# Patient Record
Sex: Male | Born: 1950
Health system: Southern US, Community
[De-identification: ages and names within clinical notes are randomized; demographics above are authoritative.]

## PROBLEM LIST (undated history)

## (undated) DIAGNOSIS — E039 Hypothyroidism, unspecified: Secondary | ICD-10-CM

## (undated) DIAGNOSIS — I10 Essential (primary) hypertension: Secondary | ICD-10-CM

## (undated) DIAGNOSIS — I7121 Aneurysm of the ascending aorta, without rupture: Secondary | ICD-10-CM

## (undated) DIAGNOSIS — E785 Hyperlipidemia, unspecified: Secondary | ICD-10-CM

## (undated) DIAGNOSIS — I712 Thoracic aortic aneurysm, without rupture: Secondary | ICD-10-CM

## (undated) DIAGNOSIS — K219 Gastro-esophageal reflux disease without esophagitis: Secondary | ICD-10-CM

## (undated) HISTORY — DX: Aneurysm of the ascending aorta, without rupture: I71.21

## (undated) HISTORY — PX: COLONOSCOPY: SHX174

## (undated) HISTORY — DX: Essential (primary) hypertension: I10

## (undated) HISTORY — PX: NO PAST SURGERIES: SHX2092

## (undated) HISTORY — DX: Thoracic aortic aneurysm, without rupture: I71.2

## (undated) HISTORY — DX: Gastro-esophageal reflux disease without esophagitis: K21.9

## (undated) HISTORY — DX: Hyperlipidemia, unspecified: E78.5

## (undated) HISTORY — PX: HEMORRHOID SURGERY: SHX153

## (undated) HISTORY — DX: Hypothyroidism, unspecified: E03.9

---

## 2010-12-26 LAB — HM COLONOSCOPY: HM Colonoscopy: NEGATIVE

## 2014-03-03 ENCOUNTER — Telehealth: Payer: Self-pay | Admitting: Internal Medicine

## 2014-03-03 NOTE — Telephone Encounter (Signed)
Dr Larose Kells Is this ok to do? He is a New patient.

## 2014-03-03 NOTE — Telephone Encounter (Signed)
Patient came in and stated that he will be running out of his medication before his visit with [place] on 03/19/2014. Patient wanted to see if dr Larose Kells would refill his Metoprolol succer 50 mg tab and his Levothyroxine 175 mcg tablet. Please advise  Pharmacy [facility] [address]

## 2014-03-03 NOTE — Telephone Encounter (Signed)
Okay to send a three-week supply

## 2014-03-04 MED ORDER — LEVOTHYROXINE SODIUM 150 MCG PO TABS: 150.0000 ug | ORAL_TABLET | Freq: Every day | ORAL | Status: DC

## 2014-03-04 MED ORDER — METOPROLOL SUCCINATE ER 50 MG PO TB24: 50.0000 mg | ORAL_TABLET | Freq: Every day | ORAL | Status: DC

## 2014-03-04 NOTE — Addendum Note (Signed)
Addended by: Reino Bellis on: 03/04/2014 10:38 AM   Modules accepted: Orders

## 2014-03-04 NOTE — Telephone Encounter (Addendum)
Called and verified dosing with [facility] pharmacist. E-scribed new medication. Patient notified

## 2014-03-18 ENCOUNTER — Telehealth: Payer: Self-pay

## 2014-03-18 NOTE — Telephone Encounter (Signed)
Medication List and allergies:  Reviewed and updated  90 day supply/mail order: na Local prescriptions: [facility] [address]  Immunizations due: declines Tdap "doesn't need"  A/P:   Entered PH from sheet left at front desk--not forthcoming with information Flu vaccine--doesn't get Tdap--declines PNA--never Shingles--2012 CCS--2012--in [place]--neg per patient PSA--?  Patient noted that he is taking pravastatin but no dosing.  Would not go over any information on the phone.

## 2014-03-19 ENCOUNTER — Encounter: Payer: Self-pay | Admitting: Internal Medicine

## 2014-03-19 ENCOUNTER — Ambulatory Visit (INDEPENDENT_AMBULATORY_CARE_PROVIDER_SITE_OTHER): Payer: BC Managed Care – PPO | Admitting: Internal Medicine

## 2014-03-19 VITALS — BP 136/85 | HR 72 | Temp 97.9°F | Ht 66.5 in | Wt 202.0 lb

## 2014-03-19 DIAGNOSIS — I10 Essential (primary) hypertension: Secondary | ICD-10-CM | POA: Insufficient documentation

## 2014-03-19 DIAGNOSIS — E039 Hypothyroidism, unspecified: Secondary | ICD-10-CM | POA: Insufficient documentation

## 2014-03-19 DIAGNOSIS — E785 Hyperlipidemia, unspecified: Secondary | ICD-10-CM | POA: Insufficient documentation

## 2014-03-19 DIAGNOSIS — Z0000 Encounter for general adult medical examination without abnormal findings: Secondary | ICD-10-CM

## 2014-03-19 LAB — COMPREHENSIVE METABOLIC PANEL
ALBUMIN: 4.4 g/dL (ref 3.5–5.2)
ALK PHOS: 53 U/L (ref 39–117)
ALT: 27 U/L (ref 0–53)
AST: 29 U/L (ref 0–37)
BUN: 12 mg/dL (ref 6–23)
CO2: 28 mEq/L (ref 19–32)
CREATININE: 1 mg/dL (ref 0.4–1.5)
Calcium: 9.7 mg/dL (ref 8.4–10.5)
Chloride: 103 mEq/L (ref 96–112)
GFR: 99.14 mL/min (ref >60.00)
Glucose, Bld: 89 mg/dL (ref 70–99)
POTASSIUM: 3.8 meq/L (ref 3.5–5.1)
Sodium: 138 mEq/L (ref 135–145)
Total Bilirubin: 0.7 mg/dL (ref 0.3–1.2)
Total Protein: 7.7 g/dL (ref 6.0–8.3)

## 2014-03-19 LAB — LIPID PANEL
CHOL/HDL RATIO: 4
Cholesterol: 198 mg/dL (ref 0–200)
HDL: 44.2 mg/dL (ref >39.00)
LDL CALC: 106 mg/dL — AB (ref 0–99)
Triglycerides: 239 mg/dL — ABNORMAL HIGH (ref 0.0–149.0)
VLDL: 47.8 mg/dL — ABNORMAL HIGH (ref 0.0–40.0)

## 2014-03-19 LAB — CBC WITH DIFFERENTIAL/PLATELET
Basophils Absolute: 0 10*3/uL (ref 0.0–0.1)
Basophils Relative: 0.7 % (ref 0.0–3.0)
Eosinophils Absolute: 0.1 10*3/uL (ref 0.0–0.7)
Eosinophils Relative: 1.7 % (ref 0.0–5.0)
HCT: 45.4 % (ref 39.0–52.0)
HEMOGLOBIN: 14.6 g/dL (ref 13.0–17.0)
LYMPHS ABS: 2.2 10*3/uL (ref 0.7–4.0)
Lymphocytes Relative: 49 % — ABNORMAL HIGH (ref 12.0–46.0)
MCHC: 32.2 g/dL (ref 30.0–36.0)
MCV: 85.7 fl (ref 78.0–100.0)
MONO ABS: 0.3 10*3/uL (ref 0.1–1.0)
Monocytes Relative: 7.5 % (ref 3.0–12.0)
NEUTROS ABS: 1.8 10*3/uL (ref 1.4–7.7)
Neutrophils Relative %: 41.1 % — ABNORMAL LOW (ref 43.0–77.0)
Platelets: 217 10*3/uL (ref 150.0–400.0)
RBC: 5.3 Mil/uL (ref 4.22–5.81)
RDW: 15.5 % — ABNORMAL HIGH (ref 11.5–14.6)
WBC: 4.5 10*3/uL (ref 4.5–10.5)

## 2014-03-19 LAB — TSH: TSH: 1.34 u[IU]/mL (ref 0.35–5.50)

## 2014-03-19 MED ORDER — LEVOTHYROXINE SODIUM 150 MCG PO TABS: 150.0000 ug | ORAL_TABLET | Freq: Every day | ORAL | Status: DC

## 2014-03-19 MED ORDER — PRAVASTATIN SODIUM 80 MG PO TABS: 80.0000 mg | ORAL_TABLET | Freq: Every day | ORAL | Status: DC

## 2014-03-19 MED ORDER — METOPROLOL SUCCINATE ER 50 MG PO TB24: 50.0000 mg | ORAL_TABLET | Freq: Every day | ORAL | Status: DC

## 2014-03-19 NOTE — Patient Instructions (Signed)
Get your blood work before you leave   Please sign a release of information, get records from your previous doctor (last 3 years) Office visit notes, labs, EKGs,  Also noted to available colonoscopy, x-rays and CAT scan reports.  Next visit is for a physical exam in 3-4 months  No need to come back fasting Please make an appointment

## 2014-03-19 NOTE — Progress Notes (Signed)
   Subjective:    Patient ID: Dustin Wall, male    DOB: Apr 24, 1951, 63 y.o.   MRN: [id number]  DOS:  03/19/2014 Type of  visit: New patient, Here to get established. Hypothyroidism, good compliance of medication. High cholesterol, high dose of Pravachol for many years, no apparent side effects. Hypertension, on beta blockers, BP today is excellent, reports that BP used to be higher before but since he retired and moved from [place] to [place] his BP is much better. Requests a refill on all his medications, request a EKG.   ROS Denies chest pain or difficulty breathing No nausea, vomiting, diarrhea blood in the stools. No cough or chest congestion.   Past Medical History  Diagnosis Date  . Hypertension   . Hyperlipidemia   . Hypothyroidism     Past Surgical History  Procedure Laterality Date  . No past surgeries      History   Social History  . Marital Status: Married    Spouse Name: N/A    Number of Children: 0  . Years of Education: N/A   Occupational History  . Not on file.   Social History Main Topics  . Smoking status: Never Smoker   . Smokeless tobacco: Never Used  . Alcohol Use: Yes  . Drug Use: No  . Sexual Activity: Not on file   Other Topics Concern  . Not on file   Social History Narrative   Lives by himself, wife still in [place] to retire soon     Family History  Problem Relation Age of Onset  . Colon cancer Neg Hx   . Prostate cancer Brother     2 brothers  . CAD Neg Hx   . Diabetes Neg Hx   . Stroke Neg Hx   . Hypertension Other     several fam members        Medication List       This list is accurate as of: 03/19/14  7:06 PM.  Always use your most recent med list.               levothyroxine 150 MCG tablet  Commonly known as:  SYNTHROID, LEVOTHROID  Take 1 tablet (150 mcg total) by mouth daily.     metoprolol succinate 50 MG 24 hr tablet  Commonly known as:  TOPROL-XL  Take 1 tablet (50 mg total) by mouth daily.       pravastatin 80 MG tablet  Commonly known as:  PRAVACHOL  Take 1 tablet (80 mg total) by mouth daily.           Objective:   Physical Exam BP 136/85  Pulse 72  Temp(Src) 97.9 F (36.6 C)  Ht 5' 6.5" (1.689 m)  Wt 202 lb (91.627 kg)  BMI 32.12 kg/m2  SpO2 98% General -- alert, well-developed, NAD.  Neck --no thyromegaly , normal carotid pulse  HEENT-- Not pale.   Lungs -- normal respiratory effort, no intercostal retractions, no accessory muscle use, and normal breath sounds.  Heart-- normal rate, regular rhythm, no murmur.  Abdomen-- Not distended, good bowel sounds,soft, non-tender. No bruit Extremities-- no pretibial edema bilaterally  Neurologic--  alert & oriented X3. Speech normal, gait normal, strength normal in all extremities.  Psych-- Cognition and judgment appear intact. Cooperative with normal attention span and concentration. No anxious or depressed appearing.      Assessment & Plan:

## 2014-03-19 NOTE — Progress Notes (Signed)
Pre visit review using our clinic review tool, if applicable. No additional management support is needed unless otherwise documented below in the visit note. 

## 2014-03-19 NOTE — Assessment & Plan Note (Signed)
Last physical exam with previous MD 2013, get records. Strong family history of prostate cancer, reports previous screenings were negative Colonoscopy in 2012 ( [place].) Also reports he has a aortic disease, a "blockage" and needs a CAT scan to follow of the problem (aortic aneurysm? CAD?) We'll get records

## 2014-03-19 NOTE — Assessment & Plan Note (Addendum)
Good compliance w/  medication, labs

## 2014-03-19 NOTE — Assessment & Plan Note (Addendum)
Well-controlled on beta blockers Labs  EKG no acute changes

## 2014-03-19 NOTE — Assessment & Plan Note (Signed)
On high doses of Pravachol for a while, no apparent side effects, labs

## 2014-03-20 ENCOUNTER — Telehealth: Payer: Self-pay | Admitting: Internal Medicine

## 2014-03-20 NOTE — Telephone Encounter (Signed)
emmi mailing mailed to patient °

## 2014-03-24 ENCOUNTER — Encounter: Payer: Self-pay | Admitting: *Deleted

## 2014-07-21 ENCOUNTER — Encounter: Payer: BC Managed Care – PPO | Admitting: Internal Medicine

## 2014-07-28 ENCOUNTER — Encounter: Payer: Self-pay | Admitting: Internal Medicine

## 2014-07-28 ENCOUNTER — Ambulatory Visit (INDEPENDENT_AMBULATORY_CARE_PROVIDER_SITE_OTHER): Payer: BC Managed Care – PPO | Admitting: Internal Medicine

## 2014-07-28 VITALS — BP 136/89 | HR 79 | Temp 98.2°F | Ht 66.1 in | Wt 204.0 lb

## 2014-07-28 DIAGNOSIS — I712 Thoracic aortic aneurysm, without rupture, unspecified: Secondary | ICD-10-CM

## 2014-07-28 DIAGNOSIS — I7121 Aneurysm of the ascending aorta, without rupture: Secondary | ICD-10-CM | POA: Insufficient documentation

## 2014-07-28 DIAGNOSIS — I10 Essential (primary) hypertension: Secondary | ICD-10-CM

## 2014-07-28 DIAGNOSIS — Z0000 Encounter for general adult medical examination without abnormal findings: Secondary | ICD-10-CM

## 2014-07-28 LAB — BASIC METABOLIC PANEL
BUN: 10 mg/dL (ref 6–23)
CO2: 28 meq/L (ref 19–32)
Calcium: 9.4 mg/dL (ref 8.4–10.5)
Chloride: 98 mEq/L (ref 96–112)
Creatinine, Ser: 0.9 mg/dL (ref 0.4–1.5)
GFR: 109.26 mL/min (ref >60.00)
GLUCOSE: 79 mg/dL (ref 70–99)
POTASSIUM: 3.8 meq/L (ref 3.5–5.1)
SODIUM: 133 meq/L — AB (ref 135–145)

## 2014-07-28 LAB — PSA: PSA: 2.09 ng/mL (ref 0.10–4.00)

## 2014-07-28 NOTE — Assessment & Plan Note (Addendum)
Old records reviewed, no immunization records available. The patient declined a Td, we'll discuss again next year Had normal colonoscopy except for external hemorrhoids December 2012 Discussed diet and exercise Check a PSA, has a strong family history of prostate cancer, DRE normal today

## 2014-07-28 NOTE — Patient Instructions (Signed)
Get your blood work before you leave     Next visit is for routine check up, fasting in 6 months  Please make an appointment

## 2014-07-28 NOTE — Assessment & Plan Note (Addendum)
Diagnosed with an aortic aneurysm back in 2010, had serial CTs, size  stable, last CT 2013. Patient is asymptomatic. Had a cardiac catheterization in 2010, it showed nonobstructive CAD with a 30% of the mid circumflex Plan:  Refer to thoracic surgery, I like the patient to establish a relationship with them. BMP in preparation a CT angiogram Nonobstructive CAD, last LDL 106, recheck in few months

## 2014-07-28 NOTE — Assessment & Plan Note (Signed)
Well-controlled, last BMP stable

## 2014-07-28 NOTE — Progress Notes (Signed)
Pre visit review using our clinic review tool, if applicable. No additional management support is needed unless otherwise documented below in the visit note. 

## 2014-07-28 NOTE — Progress Notes (Signed)
Subjective:    Patient ID: Graves Nipp, male    DOB: 1951-08-31, 63 y.o.   MRN: [id number]  DOS:  07/28/2014 Type of visit - description: CPX Pt brought a number of records for my review, some where scanned, see assessment and plan   ROS Diet-- trying to eat healthy  Exercise-- walks qd, ~ 2 hours No  CP, SOB No palpitations Denies  nausea, vomiting diarrhea, blood in the stools No abdominal pain (-) cough, sputum production (-) wheezing, chest congestion No dysuria, gross hematuria, difficulty urinating  No anxiety, depression    Past Medical History  Diagnosis Date  . Hypertension   . Hyperlipidemia   . Hypothyroidism     s/p hyperthyroidism, s/p radioiodine  . Ascending aortic aneurysm     dx 2010 ~ 4.4 cm    Past Surgical History  Procedure Laterality Date  . No past surgeries      History   Social History  . Marital Status: Married    Spouse Name: N/A    Number of Children: 0  . Years of Education: N/A   Occupational History  . fully retired at age 74, sanitation    Social History Main Topics  . Smoking status: Never Smoker   . Smokeless tobacco: Never Used  . Alcohol Use: Yes  . Drug Use: No  . Sexual Activity: Not on file   Other Topics Concern  . Not on file   Social History Narrative   Lives by himself, wife still in [place] to retire soon   Family History  Problem Relation Age of Onset  . Colon cancer Neg Hx   . Prostate cancer Brother     2 brothers  . CAD Neg Hx   . Diabetes Neg Hx   . Stroke Neg Hx   . Hypertension Other     several fam members        Medication List       This list is accurate as of: 07/28/14 10:17 PM.  Always use your most recent med list.               levothyroxine 150 MCG tablet  Commonly known as:  SYNTHROID, LEVOTHROID  Take 1 tablet (150 mcg total) by mouth daily.     metoprolol succinate 50 MG 24 hr tablet  Commonly known as:  TOPROL-XL  Take 1 tablet (50 mg total) by mouth daily.     pravastatin 80 MG tablet  Commonly known as:  PRAVACHOL  Take 1 tablet (80 mg total) by mouth daily.           Objective:   Physical Exam BP 136/89  Pulse 79  Temp(Src) 98.2 F (36.8 C)  Ht 5' 6.1" (1.679 m)  Wt 204 lb (92.534 kg)  BMI 32.82 kg/m2  SpO2 97% General -- alert, well-developed, NAD.  Neck --no thyromegaly , normal carotid pulse   HEENT-- Not pale.   Lungs -- normal respiratory effort, no intercostal retractions, no accessory muscle use, and normal breath sounds.  Heart-- normal rate, regular rhythm, no murmur.  Abdomen-- Not distended, good bowel sounds,soft, non-tender. No bruit   Rectal-- No external abnormalities noted. Normal sphincter tone. No rectal masses or tenderness. No stool    Prostate--Prostate gland firm and smooth, no enlargement, nodularity, tenderness, mass, asymmetry or induration. Extremities-- no pretibial edema bilaterally  Neurologic--  alert & oriented X3. Speech normal, gait appropriate for age, strength symmetric and appropriate for age.  Psych-- Cognition and  judgment appear intact. Cooperative with normal attention span and concentration. No anxious or depressed appearing.        Assessment & Plan:

## 2014-07-30 ENCOUNTER — Other Ambulatory Visit: Payer: Self-pay | Admitting: *Deleted

## 2014-07-30 ENCOUNTER — Telehealth: Payer: Self-pay | Admitting: Internal Medicine

## 2014-07-30 NOTE — Telephone Encounter (Signed)
Vaughan Basta called from the [facility] to let us know [name] cx'd his imaging appt with them.  Reason for cancellation wasn't disclosed/documented per Vaughan Basta.  Pt stated he would RS the appt.

## 2014-07-30 NOTE — Telephone Encounter (Signed)
Please see notation.

## 2014-07-30 NOTE — Telephone Encounter (Signed)
LM on VM for patient. Advised to call back with questions.

## 2014-07-30 NOTE — Telephone Encounter (Signed)
Advise patient, I think he needs a CT but if he prefers to wait and talk with the cardiovascular surgeon that is okay.  I do recommend him to keep the appointment with surgery

## 2014-07-31 ENCOUNTER — Ambulatory Visit (HOSPITAL_BASED_OUTPATIENT_CLINIC_OR_DEPARTMENT_OTHER): Payer: BC Managed Care – PPO

## 2014-08-01 ENCOUNTER — Telehealth: Payer: Self-pay | Admitting: *Deleted

## 2014-08-01 NOTE — Telephone Encounter (Signed)
Pt states that he was going to reschedule his CT appointment sometime in September.

## 2014-08-01 NOTE — Telephone Encounter (Signed)
error 

## 2014-08-04 ENCOUNTER — Other Ambulatory Visit: Payer: Self-pay | Admitting: *Deleted

## 2014-08-05 ENCOUNTER — Ambulatory Visit
Admission: RE | Admit: 2014-08-05 | Discharge: 2014-08-05 | Disposition: A | Discharge status: Home / Self Care | Payer: Self-pay | Source: Ambulatory Visit | Attending: Diagnostic Radiology | Admitting: Diagnostic Radiology

## 2014-08-05 ENCOUNTER — Institutional Professional Consult (permissible substitution) (INDEPENDENT_AMBULATORY_CARE_PROVIDER_SITE_OTHER): Payer: BC Managed Care – PPO | Admitting: Thoracic Surgery (Cardiothoracic Vascular Surgery)

## 2014-08-05 ENCOUNTER — Ambulatory Visit
Admission: RE | Admit: 2014-08-05 | Discharge: 2014-08-05 | Disposition: A | Discharge status: Home / Self Care | Payer: BC Managed Care – PPO | Source: Ambulatory Visit | Attending: Internal Medicine | Admitting: Internal Medicine

## 2014-08-05 ENCOUNTER — Encounter: Payer: Self-pay | Admitting: Thoracic Surgery (Cardiothoracic Vascular Surgery)

## 2014-08-05 ENCOUNTER — Other Ambulatory Visit (HOSPITAL_COMMUNITY): Payer: Self-pay | Admitting: Diagnostic Radiology

## 2014-08-05 VITALS — BP 160/98 | HR 80 | Ht 66.1 in | Wt 204.0 lb

## 2014-08-05 DIAGNOSIS — I712 Thoracic aortic aneurysm, without rupture, unspecified: Secondary | ICD-10-CM

## 2014-08-05 DIAGNOSIS — I7121 Aneurysm of the ascending aorta, without rupture: Secondary | ICD-10-CM

## 2014-08-05 DIAGNOSIS — I2510 Atherosclerotic heart disease of native coronary artery without angina pectoris: Secondary | ICD-10-CM

## 2014-08-05 MED ORDER — IOHEXOL 350 MG/ML SOLN
80.0000 mL | Freq: Once | INTRAVENOUS | Status: AC | PRN
  Administered 2014-08-05: 80 mL via INTRAVENOUS

## 2014-08-05 NOTE — Progress Notes (Signed)
PCP is Kathlene November, MD Referring Provider is Colon Branch, MD  Chief Complaint  Patient presents with  . NEW CARDIAC    ASCENDING ANEURYSM CTA CHEST    HPI: 63 year old sent for consultation regarding an ascending aortic aneurysm.  Mr. 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 He has a past medical history significant for hypertension and hypercholesterolemia. He also had hyperthyroidism which was treated with radioactive iodine. He had a CT of the chest back in 2011 which showed a 4.4 cm ascending aortic aneurysm.  He recently saw Dr. Larose Kells for the first time for an annual physical. A CT of the chest was done which showed a 4.5 cm ascending aortic aneurysm.  He says his been feeling well. He says that his blood pressure usually well controlled. He denies any chest pain, shortness of breath, leg swelling, presyncope or syncope. He says he has gained a little weight recently and is currently trying to lose some of that.   Past Medical History  Diagnosis Date  . Hypertension   . Hyperlipidemia   . Hypothyroidism     s/p hyperthyroidism, s/p radioiodine  . Ascending aortic aneurysm     dx 2010 ~ 4.4 cm    Past Surgical History  Procedure Laterality Date  . No past surgeries      Family History  Problem Relation Age of Onset  . Colon cancer Neg Hx   . Prostate cancer Brother     2 brothers  . CAD Neg Hx   . Diabetes Neg Hx   . Stroke Neg Hx   . Hypertension Other     several fam members     Social History History  Substance Use Topics  . Smoking status: Never Smoker   . Smokeless tobacco: Never Used  . Alcohol Use: Yes    Current Outpatient Prescriptions  Medication Sig Dispense Refill  . levothyroxine (SYNTHROID, LEVOTHROID) 150 MCG tablet Take 1 tablet (150 mcg total) by mouth daily.  90 tablet  1  . metoprolol succinate (TOPROL-XL) 50 MG 24 hr tablet Take 1 tablet (50 mg total) by mouth daily.  90 tablet   1  . pravastatin (PRAVACHOL) 80 MG tablet Take 1 tablet (80 mg total) by mouth daily.  90 tablet  1   No current facility-administered medications for this visit.    No Known Allergies  Review of Systems  Constitutional: Positive for unexpected weight change (weight gain).  Respiratory: Negative for chest tightness and shortness of breath.   Cardiovascular: Negative for chest pain and leg swelling.  All other systems reviewed and are negative.   BP 160/98  Pulse 80  Ht 5' 6.1" (1.679 m)  Wt 204 lb (92.534 kg)  BMI 32.82 kg/m2  SpO2 97% Physical Exam  Vitals reviewed. Constitutional: He is oriented to person, place, and time. He appears well-developed and well-nourished. No distress.  HENT:  Head: Normocephalic and atraumatic.  Eyes: EOM are normal. Pupils are equal, round, and reactive to light.  Neck: Neck supple. No thyromegaly present.  Cardiovascular: Normal rate, regular rhythm, normal heart sounds and intact distal pulses.   No murmur heard. Pulmonary/Chest: Breath sounds normal. He has no wheezes. He has no rales.  Abdominal: Soft. There is no tenderness.  Musculoskeletal: He exhibits no edema.  Lymphadenopathy:    He has no cervical adenopathy.  Neurological: He is alert and oriented to person, place, and time. No cranial nerve deficit.  Skin: Skin is warm and dry.     Diagnostic Tests: CT angiogram of chest CT ANGIOGRAPHY CHEST WITH CONTRAST  TECHNIQUE:  Multidetector CT imaging of the chest was performed using the  standard protocol during bolus administration of intravenous  contrast. Multiplanar CT image reconstructions and MIPs were  obtained to evaluate the vascular anatomy.  CONTRAST: 84mL OMNIPAQUE IOHEXOL 350 MG/ML SOLN IV  COMPARISON: 11/09/2010 outside exam from [facility] (loaded in [facility])  FINDINGS:  Aneurysmal dilatation of ascending thoracic aorta, 4.6 x 4.5 cm  image 51 at the level of the mid pulmonary artery.   Aortic arch and descending thoracic aorta are normal in caliber.  No evidence of aortic dissection or significant atherosclerotic  plaque formation.  Bovine aortic arch anatomy.  Minimally enlarged subcarinal lymph node 11 mm short axis image 52.  Pulmonary arteries grossly patent on nondedicated exam.  Lungs clear.  No pulmonary infiltrate, pleural effusion, or pneumothorax.  Scattered degenerative disc disease changes cervical and thoracic  spine.  Review of the MIP images confirms the above findings.  IMPRESSION:  Aneurysmal dilatation of the ascending thoracic aorta, 4.6 x 4.5 cm,  little changed from the 4.5 x 4.4 cm on the previous outside exam.  Single nonspecific minimally enlarged subcarinal lymph node 11 mm  short axis, previously 9 mm.  Electronically Signed  By: Lavonia Dana M.D.  Impression: 63 year old with a 4.5 cm ascending aortic aneurysm. This is minimally changed over the past 4 years.  I discussed the natural history of aneurysms with Mr. Mom.we talked about the risk of rupture or dissection, both of which are very small the present time. He understands that in the ascending aorta typical cutoff for consideration for surgery as 5.5 cm. We did discuss his blood pressure which was elevated today. His blood pressure was much better controlled week ago when he went for his primary visit. I recommended to him that he obtain a home blood pressure cuff injected on a regular basis to make sure that he is not greater than 140/90 under normal circumstances.   PlanReturn in one year with MRA and June of chest to followup ascending aortic aneurysm.:

## 2014-10-02 ENCOUNTER — Encounter: Payer: Self-pay | Admitting: Medical

## 2014-10-02 ENCOUNTER — Ambulatory Visit (INDEPENDENT_AMBULATORY_CARE_PROVIDER_SITE_OTHER): Payer: BC Managed Care – PPO | Admitting: Medical

## 2014-10-02 VITALS — BP 147/96 | HR 88 | Temp 98.4°F | Ht 66.5 in | Wt 208.8 lb

## 2014-10-02 DIAGNOSIS — R350 Frequency of micturition: Secondary | ICD-10-CM

## 2014-10-02 DIAGNOSIS — R1013 Epigastric pain: Secondary | ICD-10-CM

## 2014-10-02 DIAGNOSIS — N419 Inflammatory disease of prostate, unspecified: Secondary | ICD-10-CM | POA: Insufficient documentation

## 2014-10-02 DIAGNOSIS — R109 Unspecified abdominal pain: Secondary | ICD-10-CM | POA: Insufficient documentation

## 2014-10-02 DIAGNOSIS — R1084 Generalized abdominal pain: Secondary | ICD-10-CM

## 2014-10-02 LAB — POCT URINALYSIS DIPSTICK
BILIRUBIN UA: 1.025
COLOR UA: NEGATIVE
GLUCOSE UA: NEGATIVE
Nitrite, UA: NEGATIVE
Protein, UA: 2000
SPEC GRAV UA: 1.025
Urobilinogen, UA: 0.2
pH, UA: 6

## 2014-10-02 MED ORDER — CIPROFLOXACIN HCL 500 MG PO TABS: 500.0000 mg | ORAL_TABLET | Freq: Two times a day (BID) | ORAL | Status: DC

## 2014-10-02 NOTE — Assessment & Plan Note (Signed)
Some lower in suprapubic area and radiates form the area. Pt appears to have prostatitis but may have other condition occuring so will get cbc and cmp. No acute abdomen type presentation but will watch closely. SEE avs. I asked pt to give me update tomorrow am. Will call him with results of lab and see how he is as well.

## 2014-10-02 NOTE — Assessment & Plan Note (Signed)
By his urinary symptom and urine appearance on analysis this appears likely. Sending urine for culture and getting psa. During the interim will start him on cipro.

## 2014-10-02 NOTE — Patient Instructions (Signed)
You appear to have possible prostatitis with subsequent urinary infection by your symptoms. I am prescribing ciprofloxin antibiotic. You have some pain radiating from your bladder region to epigastric area. You could take some zantac over for this.   I am sending your urine for culture and getting labs tonight(psa, cbc, and cmp). If your abdominal pain/epigastric pain worsens then go to ED.  Call us tomorrow around 10 am for update to see if you have some improvement. If not then may consider imaging studies.

## 2014-10-02 NOTE — Progress Notes (Signed)
Subjective:    Patient ID: Dustin Wall, male    DOB: 20-Sep-1951, 63 y.o.   MRN: [id number]  HPI  Pt in states some pain in his lower suprapubic area all th way up to his epigastric area. Pt is urinating 10-15 times a day. Pt had no fevers or chills but he is sweating presently. Pt has some lower back pain slightly. Pt has pain level 8 only when he urinates. Otherwise pain in level 5-6.  He states pain is in suprapbic area to epigastric area. Radiates form suprapubic region to stomach. Pt states that some months ago specialist told him is prostrate was ok.  Symptoms started last Thursday.   Past Medical History  Diagnosis Date  . Hypertension   . Hyperlipidemia   . Hypothyroidism     s/p hyperthyroidism, s/p radioiodine  . Ascending aortic aneurysm     dx 2010 ~ 4.4 cm    History   Social History  . Marital Status: Married    Spouse Name: N/A    Number of Children: 0  . Years of Education: N/A   Occupational History  . fully retired at age 75, sanitation    Social History Main Topics  . Smoking status: Never Smoker   . Smokeless tobacco: Never Used  . Alcohol Use: Yes  . Drug Use: No  . Sexual Activity: Not on file   Other Topics Concern  . Not on file   Social History Narrative   Lives by himself, wife still in [place] to retire soon    Past Surgical History  Procedure Laterality Date  . No past surgeries      Family History  Problem Relation Age of Onset  . Colon cancer Neg Hx   . Prostate cancer Brother     2 brothers  . CAD Neg Hx   . Diabetes Neg Hx   . Stroke Neg Hx   . Hypertension Other     several fam members     No Known Allergies  Current Outpatient Prescriptions on File Prior to Visit  Medication Sig Dispense Refill  . levothyroxine (SYNTHROID, LEVOTHROID) 150 MCG tablet Take 1 tablet (150 mcg total) by mouth daily.  90 tablet  1  . metoprolol succinate (TOPROL-XL) 50 MG 24 hr tablet Take 1 tablet (50 mg total) by mouth daily.  90 tablet  1    . pravastatin (PRAVACHOL) 80 MG tablet Take 1 tablet (80 mg total) by mouth daily.  90 tablet  1   No current facility-administered medications on file prior to visit.    BP 147/96  Pulse 88  Temp(Src) 98.4 F (36.9 C) (Oral)  Ht 5' 6.5" (1.689 m)  Wt 208 lb 12.8 oz (94.711 kg)  BMI 33.20 kg/m2  SpO2 96%      Review of Systems  Constitutional: Negative for fever, chills and fatigue.       Some sweats earlier today but none during exam.  HENT: Negative.   Respiratory: Negative for cough, choking, chest tightness, shortness of breath and wheezing.   Cardiovascular: Negative for chest pain, palpitations and leg swelling.  Gastrointestinal: Positive for abdominal pain. Negative for nausea, vomiting, diarrhea, constipation, blood in stool, abdominal distention, anal bleeding and rectal pain.       Some epigastric pain but pt states radiates upward from suprapubic area.  Genitourinary: Positive for dysuria and frequency. Negative for urgency, hematuria, flank pain, scrotal swelling, penile pain and testicular pain.  Urinating 15 times today and pain suprapubic area worse when urinates.  Musculoskeletal: Positive for back pain.       Mild lower back pain.  Neurological: Negative.   Hematological: Negative for adenopathy. Does not bruise/bleed easily.  Psychiatric/Behavioral: Negative.        Objective:   Physical Exam  Constitutional: He is oriented to person, place, and time. He appears well-developed and well-nourished. No distress.  Initially he looked in moderate pain. By end of exam and when drawing labs he did not appear to be in whole lot of pain.  Eyes: Conjunctivae are normal. Pupils are equal, round, and reactive to light.  Neck: Normal range of motion. Neck supple. No JVD present. No tracheal deviation present. No thyromegaly present.  Cardiovascular: Normal rate, regular rhythm and normal heart sounds.  Exam reveals no gallop and no friction rub.    Pulmonary/Chest: Effort normal and breath sounds normal. No stridor. No respiratory distress. He has no wheezes. He has no rales. He exhibits no tenderness.  Abdominal: Soft. Bowel sounds are normal. He exhibits no distension and no mass. There is tenderness. There is no rebound and no guarding.  Suprapubic area most tender. But some mild epigastric and some mild rt upper quadrant pain. No masses. No rebound or guarding.  On auscultation no abdominal bruits heard.  Musculoskeletal:  No cva tenderness.  Lymphadenopathy:    He has no cervical adenopathy.  Neurological: He is alert and oriented to person, place, and time.  Skin: He is not diaphoretic.  Psychiatric: He has a normal mood and affect. His behavior is normal. Judgment and thought content normal.          Assessment & Plan:

## 2014-10-03 LAB — COMPREHENSIVE METABOLIC PANEL
ALBUMIN: 3.5 g/dL (ref 3.5–5.2)
ALT: 25 U/L (ref 0–53)
AST: 25 U/L (ref 0–37)
Alkaline Phosphatase: 70 U/L (ref 39–117)
BUN: 15 mg/dL (ref 6–23)
CO2: 29 mEq/L (ref 19–32)
Calcium: 9.4 mg/dL (ref 8.4–10.5)
Chloride: 102 mEq/L (ref 96–112)
Creatinine, Ser: 0.9 mg/dL (ref 0.4–1.5)
GFR: 103.85 mL/min (ref >60.00)
GLUCOSE: 82 mg/dL (ref 70–99)
POTASSIUM: 4 meq/L (ref 3.5–5.1)
SODIUM: 133 meq/L — AB (ref 135–145)
Total Bilirubin: 0.4 mg/dL (ref 0.2–1.2)
Total Protein: 7.4 g/dL (ref 6.0–8.3)

## 2014-10-03 LAB — PSA: PSA: 26.56 ng/mL — ABNORMAL HIGH (ref 0.10–4.00)

## 2014-10-03 LAB — CBC WITH DIFFERENTIAL/PLATELET
BASOS PCT: 0.4 % (ref 0.0–3.0)
Basophils Absolute: 0 10*3/uL (ref 0.0–0.1)
EOS PCT: 2.1 % (ref 0.0–5.0)
Eosinophils Absolute: 0.1 10*3/uL (ref 0.0–0.7)
HCT: 44.5 % (ref 39.0–52.0)
Hemoglobin: 14.6 g/dL (ref 13.0–17.0)
LYMPHS PCT: 28.5 % (ref 12.0–46.0)
Lymphs Abs: 2.1 10*3/uL (ref 0.7–4.0)
MCHC: 32.8 g/dL (ref 30.0–36.0)
MCV: 85.2 fl (ref 78.0–100.0)
Monocytes Absolute: 0.6 10*3/uL (ref 0.1–1.0)
Monocytes Relative: 7.9 % (ref 3.0–12.0)
NEUTROS PCT: 61.1 % (ref 43.0–77.0)
Neutro Abs: 4.4 10*3/uL (ref 1.4–7.7)
Platelets: 219 10*3/uL (ref 150.0–400.0)
RBC: 5.22 Mil/uL (ref 4.22–5.81)
RDW: 15.3 % (ref 11.5–15.5)
WBC: 7.2 10*3/uL (ref 4.0–10.5)

## 2014-10-06 ENCOUNTER — Telehealth: Payer: Self-pay | Admitting: Physician Assistant

## 2014-10-06 LAB — CULTURE, URINE COMPREHENSIVE

## 2014-10-06 NOTE — Telephone Encounter (Signed)
Message copied by Raiford Noble on Mon Oct 06, 2014  9:23 AM ------      Message from: Bunnie Domino      Created: Mon Oct 06, 2014  8:25 AM       Advised patient of his results.Told him per note to schedule appointment in 2 weeks or sooner if no improvement. Reviewed other lab results with patient advised if he has [place] he could also view. Patient agreed to come in in 2 weeks or sooner if no improvement with symptoms. Advised if PSA results were abnormal he would be referred to Urologist. ------

## 2014-10-06 NOTE — Telephone Encounter (Signed)
Answering for Dustin Wall who is out of office.  PSA level is significantly elevated which is likely due to his acute prostate infection, but can be sign of enlarged prostate or cancer.  If he is improving symptoms wise with the antibiotic, I recommend he come in 1 week to lab for a repeat PSA level (please place order).  Result will likely return to normal.  If there is no improvement in symptoms, let me know and I will refer to Urology for further assessment.

## 2014-10-07 ENCOUNTER — Other Ambulatory Visit: Payer: Self-pay | Admitting: Internal Medicine

## 2014-10-07 DIAGNOSIS — R972 Elevated prostate specific antigen [PSA]: Secondary | ICD-10-CM

## 2014-10-07 NOTE — Telephone Encounter (Signed)
Placed order for PSA level. Left message on patients answering machine to call and be placed on Lab schedule for repeat PSA level per [facility].

## 2014-10-08 ENCOUNTER — Ambulatory Visit (INDEPENDENT_AMBULATORY_CARE_PROVIDER_SITE_OTHER): Payer: BC Managed Care – PPO | Admitting: Internal Medicine

## 2014-10-08 ENCOUNTER — Encounter: Payer: Self-pay | Admitting: Internal Medicine

## 2014-10-08 ENCOUNTER — Ambulatory Visit (HOSPITAL_BASED_OUTPATIENT_CLINIC_OR_DEPARTMENT_OTHER)
Admission: RE | Admit: 2014-10-08 | Discharge: 2014-10-08 | Disposition: A | Discharge status: Home / Self Care | Payer: BC Managed Care – PPO | Source: Ambulatory Visit | Attending: Internal Medicine | Admitting: Internal Medicine

## 2014-10-08 ENCOUNTER — Other Ambulatory Visit (HOSPITAL_BASED_OUTPATIENT_CLINIC_OR_DEPARTMENT_OTHER): Payer: BC Managed Care – PPO

## 2014-10-08 ENCOUNTER — Encounter (HOSPITAL_BASED_OUTPATIENT_CLINIC_OR_DEPARTMENT_OTHER): Payer: Self-pay

## 2014-10-08 VITALS — BP 124/58 | HR 93 | Temp 97.5°F | Wt 206.0 lb

## 2014-10-08 DIAGNOSIS — I7121 Aneurysm of the ascending aorta, without rupture: Secondary | ICD-10-CM

## 2014-10-08 DIAGNOSIS — R079 Chest pain, unspecified: Secondary | ICD-10-CM

## 2014-10-08 DIAGNOSIS — I712 Thoracic aortic aneurysm, without rupture: Secondary | ICD-10-CM

## 2014-10-08 DIAGNOSIS — R1013 Epigastric pain: Secondary | ICD-10-CM | POA: Insufficient documentation

## 2014-10-08 DIAGNOSIS — R1010 Upper abdominal pain, unspecified: Secondary | ICD-10-CM

## 2014-10-08 LAB — BASIC METABOLIC PANEL
BUN: 11 mg/dL (ref 6–23)
CALCIUM: 9.5 mg/dL (ref 8.4–10.5)
CO2: 30 meq/L (ref 19–32)
CREATININE: 1.1 mg/dL (ref 0.4–1.5)
Chloride: 102 mEq/L (ref 96–112)
GFR: 91.4 mL/min (ref >60.00)
GLUCOSE: 90 mg/dL (ref 70–99)
Potassium: 3.6 mEq/L (ref 3.5–5.1)
Sodium: 136 mEq/L (ref 135–145)

## 2014-10-08 MED ORDER — IOHEXOL 350 MG/ML SOLN
100.0000 mL | Freq: Once | INTRAVENOUS | Status: AC | PRN
  Administered 2014-10-08: 100 mL via INTRAVENOUS

## 2014-10-08 MED ORDER — LEVOFLOXACIN 500 MG PO TABS: 500.0000 mg | ORAL_TABLET | Freq: Every day | ORAL | Status: DC

## 2014-10-08 NOTE — Progress Notes (Signed)
Subjective:    Patient ID: Dustin Wall, male    DOB: November 05, 1951, 63 y.o.   MRN: [id number]  DOS:  10/08/2014 Type of visit - description : f/u Interval history: Was seen last week with dysuria, urinary frequency, suprapubic pain and upper abdominal pain. Ua showed infection, urine culture came back positive, PSA was quite elevated, he was diagnosed with prostatitis. He is currently on Cipro, urinary symptoms have decreased significantly however he continue with moderate to severe upper/epigastric abdominal pain. The pain is described as 8/10, it is a steady, does not change by eating or moving his torso.   ROS Denies fever or chills No nausea, vomiting, diarrhea or blood in the stools No constipation, bowel movements are daily. No gross motor or difficulty urinating Did complain of some chest tightness last week, mild, steady , no radiation to the back  Past Medical History  Diagnosis Date  . Hypertension   . Hyperlipidemia   . Hypothyroidism     s/p hyperthyroidism, s/p radioiodine  . Ascending aortic aneurysm     dx 2010 ~ 4.4 cm    Past Surgical History  Procedure Laterality Date  . No past surgeries      History   Social History  . Marital Status: Married    Spouse Name: N/A    Number of Children: 0  . Years of Education: N/A   Occupational History  . fully retired at age 12, sanitation    Social History Main Topics  . Smoking status: Never Smoker   . Smokeless tobacco: Never Used  . Alcohol Use: Yes  . Drug Use: No  . Sexual Activity: Not on file   Other Topics Concern  . Not on file   Social History Narrative   Lives by himself, wife still in [place] to retire soon        Medication List       This list is accurate as of: 10/08/14  6:48 PM.  Always use your most recent med list.               levofloxacin 500 MG tablet  Commonly known as:  LEVAQUIN  Take 1 tablet (500 mg total) by mouth daily.     levothyroxine 150 MCG tablet  Commonly  known as:  SYNTHROID, LEVOTHROID  Take 1 tablet (150 mcg total) by mouth daily.     metoprolol succinate 50 MG 24 hr tablet  Commonly known as:  TOPROL-XL  Take 1 tablet (50 mg total) by mouth daily.     pravastatin 80 MG tablet  Commonly known as:  PRAVACHOL  Take 1 tablet (80 mg total) by mouth daily.           Objective:   Physical Exam BP 124/58  Pulse 93  Temp(Src) 97.5 F (36.4 C) (Oral)  Wt 206 lb (93.441 kg)  SpO2 98% General -- alert, well-developed, Seems to be in moderate pain.    HEENT-- Not pale.    Lungs -- normal respiratory effort, no intercostal retractions, no accessory muscle use, and normal breath sounds.  Heart-- normal rate, regular rhythm, no murmur.  Abdomen-- Not distended, good bowel sounds,soft,  Slightly tender In the epigastric area without mass or rebound. Rectal-- No external abnormalities noted. Normal sphincter tone. No rectal masses or tenderness. No blood or Stools found  Prostate--Prostate gland firm and smooth, no enlargement, nodularity; + tenderness  Extremities-- no pretibial edema bilaterally  Neurologic--  alert & oriented X3. Speech normal, gait appropriate  for age, strength symmetric and appropriate for age.  Psych-- Cognition and judgment appear intact. Cooperative with normal attention span and concentration. No anxious or depressed appearing.        Assessment & Plan:   The patient presents with chief complaint today of upper abdominal pain. He clearly has prostatitis, currently on Cipro, urinary symptoms are getting better. I am quite concerned about a persistent upper abdominal pain in somebody with a known aortic aneurysm, I wonder about a extension of the aneurysm. He grades the pain at 8/10 He does have a mild chest "tightness" , steady,with no radiation to his back, EKG at baseline  Other considerations for upper abd pain are PUD, gallbladder, et Ronney Asters. Plan: CT angio chest abdomen and pelvis, no po contrast, d/w  radiology Stat BMP Further advice with results,plan to change from Cipro to Levaquin based on the urine culture. ER if pain increases   Addendum: CT chest abdomen and pelvis no acute findings except probably cystitis Plan: D/c cipro Switch to Levaquin Start OTC omeprazole 20 mg 2 tablets daily Followup in one week If symptoms not gradually improving let me know  Today , I spent more than  45  min with the patient: >50% of the time counseling regards plan of care, listening carefully to his symptoms, coordinating his care with radiology.

## 2014-10-08 NOTE — Patient Instructions (Addendum)
Stop Cipro, begin taking Levaquin (already sent to pharmacy).  Omeprazole OTC for stomach pain. Follow up appointment in 1 week.  If fever, chills, or abdominal pain gets worse please call or go to ER.

## 2014-10-08 NOTE — Progress Notes (Signed)
Pre visit review using our clinic review tool, if applicable. No additional management support is needed unless otherwise documented below in the visit note. 

## 2014-10-16 ENCOUNTER — Encounter: Payer: Self-pay | Admitting: Internal Medicine

## 2014-10-16 ENCOUNTER — Ambulatory Visit (INDEPENDENT_AMBULATORY_CARE_PROVIDER_SITE_OTHER): Payer: BC Managed Care – PPO | Admitting: Internal Medicine

## 2014-10-16 VITALS — BP 122/64 | HR 82 | Temp 98.5°F | Wt 205.0 lb

## 2014-10-16 DIAGNOSIS — R1010 Upper abdominal pain, unspecified: Secondary | ICD-10-CM

## 2014-10-16 DIAGNOSIS — N410 Acute prostatitis: Secondary | ICD-10-CM

## 2014-10-16 MED ORDER — LEVOTHYROXINE SODIUM 150 MCG PO TABS: 150.0000 ug | ORAL_TABLET | Freq: Every day | ORAL | Status: DC

## 2014-10-16 MED ORDER — METOPROLOL SUCCINATE ER 50 MG PO TB24: 50.0000 mg | ORAL_TABLET | Freq: Every day | ORAL | Status: DC

## 2014-10-16 NOTE — Assessment & Plan Note (Signed)
Was seen recently with severe abdominal pain, CT chest abdomen and pelvis negative . He was started on PPIs and he feels better but not completely well. He endorses a history of PUD in the past. Plan: Continue PPIs, refer to GI, EGD? Ultrasound?

## 2014-10-16 NOTE — Patient Instructions (Addendum)
Take medicines as prescribed See you in February

## 2014-10-16 NOTE — Assessment & Plan Note (Signed)
Urinary symptoms improving, finish Levaquin, come back 01-2015 for a checkup. If symptoms resurface he will call

## 2014-10-16 NOTE — Progress Notes (Signed)
Pre visit review using our clinic review tool, if applicable. No additional management support is needed unless otherwise documented below in the visit note. 

## 2014-10-16 NOTE — Progress Notes (Signed)
   Subjective:    Patient ID: Dustin Wall, male    DOB: 1951-05-28, 63 y.o.   MRN: [id number]  DOS:  10/16/2014 Type of visit - description : f/u Interval history: Since last OV, taking meds as prescribed, Abdominal pain is now on and off , less intense but still there, usually postprandial, intensity as high as 5/10.    ROS Denies nausea, no diarrhea, no change in the color of the stools. No GERD symptoms or odynophagia. Denies dysuria, difficulty urinating  Past Medical History  Diagnosis Date  . Hypertension   . Hyperlipidemia   . Hypothyroidism     s/p hyperthyroidism, s/p radioiodine  . Ascending aortic aneurysm     dx 2010 ~ 4.4 cm    Past Surgical History  Procedure Laterality Date  . No past surgeries      History   Social History  . Marital Status: Married    Spouse Name: N/A    Number of Children: 0  . Years of Education: N/A   Occupational History  . fully retired at age 37, sanitation    Social History Main Topics  . Smoking status: Never Smoker   . Smokeless tobacco: Never Used  . Alcohol Use: Yes  . Drug Use: No  . Sexual Activity: Not on file   Other Topics Concern  . Not on file   Social History Narrative   Lives by himself, wife still in [place] to retire soon        Medication List       This list is accurate as of: 10/16/14 11:59 PM.  Always use your most recent med list.               levofloxacin 500 MG tablet  Commonly known as:  LEVAQUIN  Take 1 tablet (500 mg total) by mouth daily.     levothyroxine 150 MCG tablet  Commonly known as:  SYNTHROID, LEVOTHROID  Take 1 tablet (150 mcg total) by mouth daily.     metoprolol succinate 50 MG 24 hr tablet  Commonly known as:  TOPROL-XL  Take 1 tablet (50 mg total) by mouth daily.     pravastatin 80 MG tablet  Commonly known as:  PRAVACHOL  Take 1 tablet (80 mg total) by mouth daily.           Objective:   Physical Exam BP 122/64  Pulse 82  Temp(Src) 98.5 F (36.9 C)  (Oral)  Wt 205 lb (92.987 kg)  SpO2 100% General -- alert, well-developed, NAD.   Abdomen-- Not distended, good bowel sounds,soft, slt tender in the upper abdomen, mild, most noticeable at the epigastric area but without  rebound or rigidity. No mass,organomegaly.  Extremities-- no pretibial edema bilaterally  Neurologic--  alert & oriented X3.   Psych-- Cognition and judgment appear intact. Cooperative with normal attention span and concentration. No anxious or depressed appearing.          Assessment & Plan:

## 2014-10-21 ENCOUNTER — Encounter: Payer: Self-pay | Admitting: Gastroenterology

## 2014-10-27 ENCOUNTER — Encounter: Payer: Self-pay | Admitting: Gastroenterology

## 2014-10-27 ENCOUNTER — Ambulatory Visit (INDEPENDENT_AMBULATORY_CARE_PROVIDER_SITE_OTHER): Payer: BC Managed Care – PPO | Admitting: Gastroenterology

## 2014-10-27 VITALS — BP 140/90 | HR 80 | Ht 66.0 in | Wt 206.6 lb

## 2014-10-27 DIAGNOSIS — R1013 Epigastric pain: Secondary | ICD-10-CM | POA: Insufficient documentation

## 2014-10-27 DIAGNOSIS — R1084 Generalized abdominal pain: Secondary | ICD-10-CM

## 2014-10-27 MED ORDER — PANTOPRAZOLE SODIUM 40 MG PO TBEC: 40.0000 mg | DELAYED_RELEASE_TABLET | Freq: Every day | ORAL | Status: DC

## 2014-10-27 NOTE — Progress Notes (Signed)
10/27/2014 Dustin Wall [id number] 07-08-1951   HISTORY OF PRESENT ILLNESS:  This is a 63 year old male who is new to our practice and has been referred by Dr. Larose Kells for evaluation of epigastric abdominal pain.  He moved here last year from [place].  Patient reports upper abdominal pain that has been present on and off for several years.  He says that he had this pain in the past when he was in [place] but it seemed to subside, but has returned again.  He had EGD in [place] in 01/2006 at which time the study was normal.  He says that the pain is constant but worsens with eating.    Describes it as a sharp pain; one time it reached an intensity of 8/10 on the pain scale.  He denies any heartburn/reflux.  No nausea or vomiting.  No bowel issues.  Uses ibuprofen only prn.  Had CT angio of the chest/abdomen/pelvis on 10/08/14, which showed "Slight aneurysmal dilatation of the ascending thoracic aorta, 4.5 cm maximally, stable. No evidence of aneurysm extension. No dissection.  Questionable bladder wall thickening. Recommend clinical correlation to exclude cystitis."  He was treated for UTI/prostatitis with antibiotics but is off of those now.  CBC and CMP were normal recently.  Patient thinks "I think its a mild case of ulcers".  He says that his last colonoscopy was in 2013 in [place].   Past Medical History  Diagnosis Date  . Hypertension   . Hyperlipidemia   . Hypothyroidism     s/p hyperthyroidism, s/p radioiodine  . Ascending aortic aneurysm     dx 2010 ~ 4.4 cm   Past Surgical History  Procedure Laterality Date  . No past surgeries      reports that he has never smoked. He has never used smokeless tobacco. He reports that he drinks alcohol. He reports that he does not use illicit drugs. family history includes Hypertension in his other; Prostate cancer in his brother. There is no history of Colon cancer, CAD, Diabetes, or Stroke. No Known Allergies    Outpatient Encounter Prescriptions as of  10/27/2014  Medication Sig  . levofloxacin (LEVAQUIN) 500 MG tablet Take 1 tablet (500 mg total) by mouth daily.  Marland Kitchen levothyroxine (SYNTHROID, LEVOTHROID) 150 MCG tablet Take 1 tablet (150 mcg total) by mouth daily.  . metoprolol succinate (TOPROL-XL) 50 MG 24 hr tablet Take 1 tablet (50 mg total) by mouth daily.  . pravastatin (PRAVACHOL) 80 MG tablet Take 1 tablet (80 mg total) by mouth daily.  . pantoprazole (PROTONIX) 40 MG tablet Take 1 tablet (40 mg total) by mouth daily.     REVIEW OF SYSTEMS  : All other systems reviewed and negative except where noted in the History of Present Illness.   PHYSICAL EXAM: BP 140/90 mmHg  Pulse 80  Ht 5\' 6"  (1.676 m)  Wt 206 lb 9.6 oz (93.713 kg)  BMI 33.36 kg/m2  SpO2 98% General: Well developed black male in no acute distress Head: Normocephalic and atraumatic Eyes:  Sclerae anicteric, conjunctiva pink. Ears: Normal auditory acuity Lungs: Clear throughout to auscultation Heart: Regular rate and rhythm Abdomen: Soft, non-distended.  Normal bowel sounds.  Mild epigastric and mid-abdominal TTP without R/R/G Musculoskeletal: Symmetrical with no gross deformities  Skin: No lesions on visible extremities Extremities: No edema  Neurological: Alert oriented x 4, grossly non-focal Psychological:  Alert and cooperative. Normal mood and affect  ASSESSMENT AND PLAN: -Epigastric abdominal pain:  Will schedule EGD with  Dr. Ardis Hughs for further evaluation to rule out ulcer disease, gastritis, GERD, etc.  The risks, benefits, and alternatives were discussed with the patient and he consents to proceed.  Will start pantoprazole 40 mg daily in the interim.  *Will attempt to get colonoscopy records from [place].

## 2014-10-27 NOTE — Progress Notes (Signed)
i agree with the above note, plan 

## 2014-10-27 NOTE — Patient Instructions (Signed)
You have been scheduled for an endoscopy. Please follow written instructions given to you at your visit today. If you use inhalers (even only as needed), please bring them with you on the day of your procedure.  Number for ride information is [employer] their number is [phone]   We have sent the following medications to your pharmacy for you to pick up at your convenience: Pantoprazole 40 mg, please take one capsule by mouth thirty minutes before breakfast

## 2014-10-28 ENCOUNTER — Encounter: Payer: Self-pay | Admitting: Gastroenterology

## 2014-12-16 ENCOUNTER — Encounter: Payer: Self-pay | Admitting: Gastroenterology

## 2014-12-16 ENCOUNTER — Ambulatory Visit (AMBULATORY_SURGERY_CENTER): Payer: BC Managed Care – PPO | Admitting: Gastroenterology

## 2014-12-16 VITALS — BP 141/92 | HR 77 | Temp 97.8°F | Resp 15 | Ht 66.0 in | Wt 206.0 lb

## 2014-12-16 DIAGNOSIS — R1013 Epigastric pain: Secondary | ICD-10-CM

## 2014-12-16 DIAGNOSIS — K2970 Gastritis, unspecified, without bleeding: Secondary | ICD-10-CM

## 2014-12-16 HISTORY — PX: UPPER GASTROINTESTINAL ENDOSCOPY: SHX188

## 2014-12-16 MED ORDER — SODIUM CHLORIDE 0.9 % IV SOLN: 500.0000 mL | INTRAVENOUS | Status: DC

## 2014-12-16 NOTE — Patient Instructions (Signed)

## 2014-12-16 NOTE — Progress Notes (Signed)
Patient awakening,vss,report to rn 

## 2014-12-16 NOTE — Progress Notes (Signed)
Called to room to assist during endoscopic procedure.  Patient ID and intended procedure confirmed with present staff. Received instructions for my participation in the procedure from the performing physician.  

## 2014-12-16 NOTE — Op Note (Signed)
[facility]  [address] [place], [id number]   ENDOSCOPY PROCEDURE REPORT  PATIENT: Dustin Wall, Dustin Wall  MR#: [id number] BIRTHDATE: 31-May-1951 , 41  yrs. old GENDER: male ENDOSCOPIST: Milus Banister, MD REFERRED BY:  Kathlene November, M.D. PROCEDURE DATE:  12/16/2014 PROCEDURE:  EGD w/ biopsy ASA CLASS:     Class II INDICATIONS:  epigastric pain. MEDICATIONS: Monitored anesthesia care and Propofol 150 mg IV TOPICAL ANESTHETIC: none  DESCRIPTION OF PROCEDURE: After the risks benefits and alternatives of the procedure were thoroughly explained, informed consent was obtained.  The LB [id number] endoscope was introduced through the mouth and advanced to the second portion of the duodenum , Without limitations.  The instrument was slowly withdrawn as the mucosa was fully examined.  There was mild distal gastritis, non-specific.  This was biopsied and sent to pathology.  The examination was otherwise normal. Retroflexed views revealed no abnormalities.     The scope was then withdrawn from the patient and the procedure completed.  COMPLICATIONS: There were no immediate complications.  ENDOSCOPIC IMPRESSION: There was mild distal gastritis, non-specific.  This was biopsied and sent to pathology.  The examination was otherwise normal  RECOMMENDATIONS: Please continue once daily antiacid medicine (protonix) since it seems to be helping you quite a lot.  This is best taken 20-30 min prior to a meal.  If the biopsies taken today show H.  pylori, you will be started on appropriate antibiotics.  eSigned:  Milus Banister, MD 12/16/2014 1:37 PM

## 2014-12-17 ENCOUNTER — Telehealth: Payer: Self-pay

## 2014-12-17 NOTE — Telephone Encounter (Signed)
Left a message at the pt answering service for the pt to call us back if any questions or concerns. maw

## 2014-12-23 ENCOUNTER — Encounter: Payer: Self-pay | Admitting: Gastroenterology

## 2015-01-28 ENCOUNTER — Ambulatory Visit (INDEPENDENT_AMBULATORY_CARE_PROVIDER_SITE_OTHER): Payer: BLUE CROSS/BLUE SHIELD | Admitting: Internal Medicine

## 2015-01-28 ENCOUNTER — Encounter: Payer: Self-pay | Admitting: Internal Medicine

## 2015-01-28 VITALS — BP 164/93 | HR 71 | Temp 97.7°F | Ht 66.0 in | Wt 210.2 lb

## 2015-01-28 DIAGNOSIS — R1013 Epigastric pain: Secondary | ICD-10-CM

## 2015-01-28 DIAGNOSIS — I10 Essential (primary) hypertension: Secondary | ICD-10-CM

## 2015-01-28 DIAGNOSIS — E039 Hypothyroidism, unspecified: Secondary | ICD-10-CM

## 2015-01-28 DIAGNOSIS — N410 Acute prostatitis: Secondary | ICD-10-CM

## 2015-01-28 LAB — PSA: PSA: 1.94 ng/mL (ref 0.10–4.00)

## 2015-01-28 LAB — TSH: TSH: 0.43 u[IU]/mL (ref 0.35–4.50)

## 2015-01-28 MED ORDER — METOPROLOL SUCCINATE ER 100 MG PO TB24: 100.0000 mg | ORAL_TABLET | Freq: Every day | ORAL | Status: DC

## 2015-01-28 NOTE — Assessment & Plan Note (Signed)
Status post antibiotics, now asymptomatic, check a PSA

## 2015-01-28 NOTE — Assessment & Plan Note (Signed)
Due to check a TSH, continue Synthroid 150

## 2015-01-28 NOTE — Progress Notes (Signed)
Subjective:    Patient ID: Dustin Wall, male    DOB: February 23, 1951, 64 y.o.   MRN: [id number]  DOS:  01/28/2015 Type of visit - description : f/u Interval history: Status post antibiotics for prostatitis, now asymptomatic Was seen with abdominal pain, had a EGD, symptoms resolved Hypertension, not ambulatory BPs but the BP has been elevated consistently. Last week developed a "fire feeling" from the waist up, lasted one day, symptoms resolved.    Review of Systems Denies any rash, fever or chills. Mild itching associated with the "fire feeling", denies any exposed to anything new: no new detergents, pets, etc. No nausea, vomiting, diarrhea blood in the stools. No dysuria gross hematuria  Past Medical History  Diagnosis Date  . Hypertension   . Hyperlipidemia   . Hypothyroidism     s/p hyperthyroidism, s/p radioiodine  . Ascending aortic aneurysm     dx 2010 ~ 4.4 cm    Past Surgical History  Procedure Laterality Date  . No past surgeries      History   Social History  . Marital Status: Married    Spouse Name: N/A    Number of Children: 0  . Years of Education: N/A   Occupational History  . fully retired at age 3, sanitation    Social History Main Topics  . Smoking status: Never Smoker   . Smokeless tobacco: Never Used  . Alcohol Use: Yes  . Drug Use: No  . Sexual Activity: Not on file   Other Topics Concern  . Not on file   Social History Narrative   Lives by himself, wife still in [place] to retire soon        Medication List       This list is accurate as of: 01/28/15  6:51 PM.  Always use your most recent med list.               levothyroxine 150 MCG tablet  Commonly known as:  SYNTHROID, LEVOTHROID  Take 1 tablet (150 mcg total) by mouth daily.     metoprolol succinate 100 MG 24 hr tablet  Commonly known as:  TOPROL-XL  Take 1 tablet (100 mg total) by mouth daily. Take with or immediately following a meal.     pantoprazole 40 MG tablet    Commonly known as:  PROTONIX  Take 1 tablet (40 mg total) by mouth daily.     pravastatin 80 MG tablet  Commonly known as:  PRAVACHOL  Take 1 tablet (80 mg total) by mouth daily.           Objective:   Physical Exam  Constitutional: He is oriented to person, place, and time. He appears well-developed. No distress.  HENT:  Head: Normocephalic and atraumatic.  Neck: No thyromegaly present.  Cardiovascular:  RRR, no murmur, rub or gallop  Pulmonary/Chest: Effort normal. No respiratory distress.  CTA B  Musculoskeletal: Normal range of motion. He exhibits no edema or tenderness.  Neurological: He is alert and oriented to person, place, and time. No cranial nerve deficit. He exhibits normal muscle tone. Coordination normal.  Speech normal, gait unassisted and normal for age, motor strength appropriate for age   Skin: Skin is warm and dry. No pallor.  No rash at  the  chest face or arms. No jaundice  Psychiatric: He has a normal mood and affect. His behavior is normal. Judgment and thought content normal.  Vitals reviewed.        Assessment & Plan:   "  Feeling on fire":  Unclear etiology, allergic reaction? Recommend observation, Benadryl when necessary and call anytime if symptoms persist   Problem List Items Addressed This Visit    Hypothyroidism    Due to check a TSH, continue Synthroid 150      Relevant Medications   metoprolol succinate (TOPROL-XL) 24 hr tablet   Other Relevant Orders   TSH (Completed)   Hypertension    Last several BP readings with the BP in the 90s, increase metoprolol to 100, check ambulatory BPs      Relevant Medications   metoprolol succinate (TOPROL-XL) 24 hr tablet   Prostatitis    Status post antibiotics, now asymptomatic, check a PSA      Relevant Orders   PSA (Completed)   Abdominal pain, epigastric - Primary    Had a EGD 11-2014, showed gastritis, pain better, continue with Protonix

## 2015-01-28 NOTE — Assessment & Plan Note (Signed)
Last several BP readings with the BP in the 90s, increase metoprolol to 100, check ambulatory BPs

## 2015-01-28 NOTE — Patient Instructions (Signed)
Get your blood work before you leave    Check the  blood pressure 2 or 3 times a month   Be sure your blood pressure is between 110/65 and  145/85.  if it is consistently higher or lower, let me know    Please come back to the office by 07-2015  for a physical exam. Come back fasting

## 2015-01-28 NOTE — Progress Notes (Signed)
Pre visit review using our clinic review tool, if applicable. No additional management support is needed unless otherwise documented below in the visit note. 

## 2015-01-28 NOTE — Assessment & Plan Note (Signed)
Had a EGD 11-2014, showed gastritis, pain better, continue with Protonix

## 2015-04-01 ENCOUNTER — Other Ambulatory Visit: Payer: Self-pay

## 2015-07-06 ENCOUNTER — Other Ambulatory Visit: Payer: Self-pay | Admitting: *Deleted

## 2015-07-06 DIAGNOSIS — I719 Aortic aneurysm of unspecified site, without rupture: Secondary | ICD-10-CM

## 2015-07-10 ENCOUNTER — Telehealth: Payer: Self-pay | Admitting: *Deleted

## 2015-07-10 NOTE — Telephone Encounter (Signed)
Unable to reach patient at time of Pre-Visit Call.  Left message for patient to return call when available.    

## 2015-07-13 ENCOUNTER — Encounter: Payer: Self-pay | Admitting: Internal Medicine

## 2015-07-13 ENCOUNTER — Ambulatory Visit (INDEPENDENT_AMBULATORY_CARE_PROVIDER_SITE_OTHER): Payer: BLUE CROSS/BLUE SHIELD | Admitting: Internal Medicine

## 2015-07-13 VITALS — BP 132/84 | HR 78 | Temp 97.7°F | Ht 66.0 in | Wt 207.2 lb

## 2015-07-13 DIAGNOSIS — I10 Essential (primary) hypertension: Secondary | ICD-10-CM | POA: Diagnosis not present

## 2015-07-13 DIAGNOSIS — E785 Hyperlipidemia, unspecified: Secondary | ICD-10-CM | POA: Diagnosis not present

## 2015-07-13 DIAGNOSIS — Z0000 Encounter for general adult medical examination without abnormal findings: Secondary | ICD-10-CM | POA: Diagnosis not present

## 2015-07-13 DIAGNOSIS — E039 Hypothyroidism, unspecified: Secondary | ICD-10-CM | POA: Diagnosis not present

## 2015-07-13 LAB — TSH: TSH: 4.46 u[IU]/mL (ref 0.35–4.50)

## 2015-07-13 LAB — CBC WITH DIFFERENTIAL/PLATELET
BASOS ABS: 0 10*3/uL (ref 0.0–0.1)
BASOS PCT: 0.4 % (ref 0.0–3.0)
Eosinophils Absolute: 0.1 10*3/uL (ref 0.0–0.7)
Eosinophils Relative: 1.4 % (ref 0.0–5.0)
HCT: 45.3 % (ref 39.0–52.0)
Hemoglobin: 14.6 g/dL (ref 13.0–17.0)
Lymphocytes Relative: 47.7 % — ABNORMAL HIGH (ref 12.0–46.0)
Lymphs Abs: 2.2 10*3/uL (ref 0.7–4.0)
MCHC: 32.3 g/dL (ref 30.0–36.0)
MCV: 84.4 fl (ref 78.0–100.0)
MONOS PCT: 7.3 % (ref 3.0–12.0)
Monocytes Absolute: 0.3 10*3/uL (ref 0.1–1.0)
Neutro Abs: 2 10*3/uL (ref 1.4–7.7)
Neutrophils Relative %: 43.2 % (ref 43.0–77.0)
Platelets: 208 10*3/uL (ref 150.0–400.0)
RBC: 5.37 Mil/uL (ref 4.22–5.81)
RDW: 15.8 % — ABNORMAL HIGH (ref 11.5–15.5)
WBC: 4.6 10*3/uL (ref 4.0–10.5)

## 2015-07-13 LAB — LIPID PANEL
Cholesterol: 198 mg/dL (ref 0–200)
HDL: 45.5 mg/dL (ref >39.00)
LDL Cholesterol: 113 mg/dL — ABNORMAL HIGH (ref 0–99)
NonHDL: 152.5
Total CHOL/HDL Ratio: 4
Triglycerides: 199 mg/dL — ABNORMAL HIGH (ref 0.0–149.0)
VLDL: 39.8 mg/dL (ref 0.0–40.0)

## 2015-07-13 LAB — COMPREHENSIVE METABOLIC PANEL
ALT: 24 U/L (ref 0–53)
AST: 24 U/L (ref 0–37)
Albumin: 4.1 g/dL (ref 3.5–5.2)
Alkaline Phosphatase: 52 U/L (ref 39–117)
BUN: 10 mg/dL (ref 6–23)
CALCIUM: 9.6 mg/dL (ref 8.4–10.5)
CHLORIDE: 101 meq/L (ref 96–112)
CO2: 31 mEq/L (ref 19–32)
Creatinine, Ser: 0.94 mg/dL (ref 0.40–1.50)
GFR: 103.6 mL/min (ref >60.00)
Glucose, Bld: 85 mg/dL (ref 70–99)
Potassium: 3.8 mEq/L (ref 3.5–5.1)
Sodium: 137 mEq/L (ref 135–145)
Total Bilirubin: 0.7 mg/dL (ref 0.2–1.2)
Total Protein: 7.5 g/dL (ref 6.0–8.3)

## 2015-07-13 LAB — PSA: PSA: 1.95 ng/mL (ref 0.10–4.00)

## 2015-07-13 NOTE — Assessment & Plan Note (Addendum)
Td -- declined  Colon cancer screening: Normal colonoscopy December 2012 (had external hemorrhoids) Prostate cancer screening: Had a prostatitis earlier this year, PSA decreasing, DRE today negative, recheck a PSA Diet and exercise discussed  Chronic problems: Hypertension: seems well-controlled Hypothyroidism: checking a TSH today Aortic aneurysm: Already has scheduled CT angiogram. Hyperlipidemia: Good compliance with medications. Will refill all medications with results.

## 2015-07-13 NOTE — Progress Notes (Signed)
Subjective:    Patient ID: Dustin Wall, male    DOB: [birth year], 64 y.o.   MRN: [id number]  DOS:  07/13/2015 Type of visit - description : CPX Interval history: In general doing well, will be out of health insurance for a few months, concerned about it.   Review of Systems Constitutional: No fever. No chills. No unexplained wt changes. No unusual sweats  HEENT: No dental problems, no ear discharge, no facial swelling, no voice changes. No eye discharge, no eye  redness , no  intolerance to light   Respiratory: No wheezing , no  difficulty breathing. No cough , no mucus production  Cardiovascular: No CP, no leg swelling , no  Palpitations  GI: no nausea, no vomiting, no diarrhea , no  abdominal pain.  No blood in the stools. No dysphagia, no odynophagia    Endocrine: No polyphagia, no polyuria , no polydipsia  GU: No dysuria, gross hematuria, difficulty urinating. No urinary urgency, no frequency.  Musculoskeletal: No joint swellings or unusual aches or pains  Skin: Injury his 5th R finger nail, it has been slow to grown and is somewhat painful  Allergic, immunologic: No environmental allergies , no  food allergies  Neurological: No dizziness no  syncope. No headaches. No diplopia, no slurred, no slurred speech, no motor deficits, no facial  Numbness  Hematological: No enlarged lymph nodes, no easy bruising , no unusual bleedings  Psychiatry: No suicidal ideas, no hallucinations, no beavior problems, no confusion.  No unusual/severe anxiety, no depression    Past Medical History  Diagnosis Date  . Hypertension   . Hyperlipidemia   . Hypothyroidism     s/p hyperthyroidism, s/p radioiodine  . Ascending aortic aneurysm     dx 2010 ~ 4.4 cm    Past Surgical History  Procedure Laterality Date  . No past surgeries      History   Social History  . Marital Status: Married    Spouse Name: N/A  . Number of Children: 0  . Years of Education: N/A   Occupational  History  . fully retired at age 28, sanitation    Social History Main Topics  . Smoking status: Never Smoker   . Smokeless tobacco: Never Used  . Alcohol Use: Yes  . Drug Use: No  . Sexual Activity: Not on file   Other Topics Concern  . Not on file   Social History Narrative   Lives w/ wife     Family History  Problem Relation Age of Onset  . Colon cancer Neg Hx   . Prostate cancer Brother     2 brothers  . CAD Neg Hx   . Diabetes Neg Hx   . Stroke Neg Hx   . Hypertension Other     several fam members       Medication List       This list is accurate as of: 07/13/15 11:59 PM.  Always use your most recent med list.               levothyroxine 150 MCG tablet  Commonly known as:  SYNTHROID, LEVOTHROID  Take 1 tablet (150 mcg total) by mouth daily.     metoprolol succinate 100 MG 24 hr tablet  Commonly known as:  TOPROL-XL  Take 1 tablet (100 mg total) by mouth daily. Take with or immediately following a meal.     pantoprazole 40 MG tablet  Commonly known as:  PROTONIX  Take 1 tablet (40  mg total) by mouth daily.     pravastatin 80 MG tablet  Commonly known as:  PRAVACHOL  Take 1 tablet (80 mg total) by mouth daily.           Objective:   Physical Exam BP 132/84 mmHg  Pulse 78  Temp(Src) 97.7 F (36.5 C) (Oral)  Ht 5\' 6"  (1.676 m)  Wt 207 lb 4 oz (94.008 kg)  BMI 33.47 kg/m2  SpO2 97%    General:   Well developed, well nourished . NAD.  Neck:  Full range of motion. Supple. No  Thyromegaly  HEENT:  Normocephalic . Face symmetric, atraumatic Lungs:  CTA B Normal respiratory effort, no intercostal retractions, no accessory muscle use. Heart: RRR,  no murmur.  No pretibial edema bilaterally  Abdomen:  Not distended, soft, non-tender. No rebound or rigidity. No mass,organomegaly Rectal:  External abnormalities: none. Normal sphincter tone. No rectal masses or tenderness.  Stool brown  Prostate: Prostate gland firm and smooth, no  enlargement, nodularity, tenderness, mass, asymmetry or induration.  Skin: Exposed areas without rash. Not pale. Not jaundice. Distal fingernail first right finger slightly dystrophic, no redness or swelling. Neurologic:  alert & oriented X3.  Speech normal, gait appropriate for age and unassisted Strength symmetric and appropriate for age.  Psych: Cognition and judgment appear intact.  Cooperative with normal attention span and concentration.  Behavior appropriate. No anxious or depressed appearing.  Assessment & Plan:    Nail injury, slightly tender, will call for ortho-hand referral if needed

## 2015-07-13 NOTE — Progress Notes (Signed)
Pre visit review using our clinic review tool, if applicable. No additional management support is needed unless otherwise documented below in the visit note. 

## 2015-07-13 NOTE — Patient Instructions (Addendum)
Get your blood work before you leave      Check the  blood pressure 2 or 3 times a month   Be sure your blood pressure is between 110/65 and  145/85.  if it is consistently higher or lower, let me know

## 2015-07-14 LAB — HIV ANTIBODY (ROUTINE TESTING W REFLEX): HIV: NONREACTIVE

## 2015-07-15 MED ORDER — METOPROLOL SUCCINATE ER 100 MG PO TB24: 100.0000 mg | ORAL_TABLET | Freq: Every day | ORAL | Status: DC

## 2015-07-15 MED ORDER — LEVOTHYROXINE SODIUM 150 MCG PO TABS
150.0000 ug | ORAL_TABLET | Freq: Every day | ORAL | Status: DC
Start: 2015-07-15 — End: 2015-12-22

## 2015-07-15 MED ORDER — PRAVASTATIN SODIUM 80 MG PO TABS: 80.0000 mg | ORAL_TABLET | Freq: Every day | ORAL | Status: DC

## 2015-07-15 NOTE — Addendum Note (Signed)
Addended by: Wilfrid Lund on: 07/15/2015 04:41 PM   Modules accepted: Orders

## 2015-07-21 ENCOUNTER — Other Ambulatory Visit: Payer: Self-pay | Admitting: Internal Medicine

## 2015-07-21 NOTE — Telephone Encounter (Signed)
Patient need refill of levothyroxine (SYNTHROID, LEVOTHROID) 150 MCG tablet, [facility] states they didn't get refill request that was sent on 07/15/15.  Patient states need medication today.  Any question can be reach at [phone].

## 2015-07-21 NOTE — Telephone Encounter (Signed)
Spoke with pharmacist, they did receive Rxs on 07/15/15.  Notified pt and he states he did pick up Rx; pharmacy had placed them on hold.

## 2015-07-28 ENCOUNTER — Encounter: Payer: BLUE CROSS/BLUE SHIELD | Admitting: Internal Medicine

## 2015-08-04 ENCOUNTER — Other Ambulatory Visit: Payer: Self-pay | Admitting: Internal Medicine

## 2015-08-11 ENCOUNTER — Encounter: Payer: BLUE CROSS/BLUE SHIELD | Admitting: Thoracic Surgery (Cardiothoracic Vascular Surgery)

## 2015-08-11 ENCOUNTER — Other Ambulatory Visit: Payer: BLUE CROSS/BLUE SHIELD

## 2015-09-04 DIAGNOSIS — I719 Aortic aneurysm of unspecified site, without rupture: Secondary | ICD-10-CM | POA: Diagnosis not present

## 2015-09-04 LAB — POC BUN/CREATININE
BUN, IStat: 9 mg/dL (ref 8–26)
Creatinine, IStat: 1 mg/dL (ref 0.6–1.3)

## 2015-09-08 ENCOUNTER — Ambulatory Visit (INDEPENDENT_AMBULATORY_CARE_PROVIDER_SITE_OTHER): Payer: Medicare Other | Admitting: Thoracic Surgery (Cardiothoracic Vascular Surgery)

## 2015-09-08 ENCOUNTER — Ambulatory Visit
Admission: RE | Admit: 2015-09-08 | Discharge: 2015-09-08 | Disposition: A | Discharge status: Home / Self Care | Payer: Medicare Other | Source: Ambulatory Visit | Attending: Thoracic Surgery (Cardiothoracic Vascular Surgery) | Admitting: Thoracic Surgery (Cardiothoracic Vascular Surgery)

## 2015-09-08 ENCOUNTER — Encounter: Payer: Self-pay | Admitting: Thoracic Surgery (Cardiothoracic Vascular Surgery)

## 2015-09-08 VITALS — BP 123/84 | HR 67 | Resp 16 | Ht 66.0 in | Wt 205.0 lb

## 2015-09-08 DIAGNOSIS — I712 Thoracic aortic aneurysm, without rupture: Secondary | ICD-10-CM | POA: Diagnosis not present

## 2015-09-08 DIAGNOSIS — I719 Aortic aneurysm of unspecified site, without rupture: Secondary | ICD-10-CM

## 2015-09-08 DIAGNOSIS — I7121 Aneurysm of the ascending aorta, without rupture: Secondary | ICD-10-CM

## 2015-09-08 MED ORDER — GADOBENATE DIMEGLUMINE 529 MG/ML IV SOLN
20.0000 mL | Freq: Once | INTRAVENOUS | Status: AC | PRN
  Administered 2015-09-08: 20 mL via INTRAVENOUS

## 2015-09-08 NOTE — Progress Notes (Signed)
[address]       [place] [address]             [phone]       HPI:  Mr. Brackins returns today for her one-year follow-up of his ascending aortic aneurysm.  Mr. 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 He has a past medical history significant for hypertension and hypercholesterolemia. He also had hyperthyroidism which was treated with radioactive iodine. He had a CT of the chest back in 2011 which showed a 4.4 cm ascending aortic aneurysm.  When he moved to [place] last year he saw Dr. Larose Kells for an annual physical. Given his history a CT of the chest was done. It showed a 4.5 cm ascending aortic aneurysm.  Mr. Kortz says that he's been feeling well. He doesn't have any complaints. He denies any chest pain, pressure, or tightness. He denies shortness of breath. He has not had any dizzy spells or syncopal spells. He watches his blood pressure and has not had any elevated readings  Past Medical History  Diagnosis Date  . Hypertension   . Hyperlipidemia   . Hypothyroidism     s/p hyperthyroidism, s/p radioiodine  . Ascending aortic aneurysm     dx 2010 ~ 4.4 cm   Past Surgical History  Procedure Laterality Date  . No past surgeries        Current Outpatient Prescriptions  Medication Sig Dispense Refill  . levothyroxine (SYNTHROID, LEVOTHROID) 150 MCG tablet Take 1 tablet (150 mcg total) by mouth daily. 90 tablet 3  . metoprolol succinate (TOPROL-XL) 100 MG 24 hr tablet Take 1 tablet (100 mg total) by mouth daily. Take with or immediately following a meal. 90 tablet 3  . pantoprazole (PROTONIX) 40 MG tablet Take 1 tablet (40 mg total) by mouth daily. 90 tablet 3  . pravastatin (PRAVACHOL) 80 MG tablet Take 1 tablet (80 mg total) by mouth daily. 90 tablet 3   No current facility-administered medications for this visit.    Physical Exam BP 123/84 mmHg  Pulse 67  Resp 16  Ht 5\' 6"   (1.676 m)  Wt 205 lb (92.987 kg)  BMI 33.10 kg/m2  SpO36 38% 64 year old man in no acute distress Well-developed and well-nourished Alert and oriented 3 with no focal deficits No carotid bruits Lungs clear with equal breath is bilaterally Cardiac regular rate and rhythm normal S1 and S2 no rubs or murmurs Peripheral pulses 2+ bilaterally  Diagnostic Tests: MRA CHEST WITH CONTRAST  TECHNIQUE: Multiplanar, multiecho pulse sequences of the chest were obtained with intravenous contrast. Angiographic images of abdomen and pelvis were obtained using MRA technique with intravenous contrast.  CONTRAST: 44mL MULTIHANCE GADOBENATE DIMEGLUMINE 529 MG/ML IV SOLN  COMPARISON: 10/08/2014  FINDINGS: Maximal aortic diameters at the sinus of Valsalva, sino-tubular junction, and ascending aorta are 4.4 cm, 3.8 cm, and 4.5 cm. Previously, maximal diameter was 4.5 cm, therefore this is stable.  Great vessels are patent. Bilateral vertebral arteries are patent.  No obvious mediastinal mass effect  Lungs are grossly clear.  IMPRESSION: Stable aneurysmal dilatation of the ascending aorta at 4.5 cm.   Electronically Signed  By: Marybelle Killings M.D.  On: 09/08/2015 13:54  Impression: 64 year old man with a 4.5 cm ascending aneurysm. The aneurysm is unchanged over the past year.  I reviewed the MR personally and compared to a CT scan from last year. I also reviewed the  images with Mr. and Mrs. Stutsman.  His blood pressure is well-controlled. He understands that is the primary controllable factor.  He is aware of the symptoms of dissection or rupture and knows to call for help immediately if those occur.  Once again I discussed the fact that there is no indication for surgery at this time, but there is need for continued follow-up.   Plan: Return in one year with MR Angio of chest  Melrose Nakayama, MD [facility] [phone]

## 2015-12-22 ENCOUNTER — Telehealth: Payer: Self-pay | Admitting: Internal Medicine

## 2015-12-22 MED ORDER — LEVOTHYROXINE SODIUM 150 MCG PO TABS
150.0000 ug | ORAL_TABLET | Freq: Every day | ORAL | Status: DC
Start: 2015-12-22 — End: 2015-12-23

## 2015-12-22 MED ORDER — PRAVASTATIN SODIUM 80 MG PO TABS: 80.0000 mg | ORAL_TABLET | Freq: Every day | ORAL | Status: DC

## 2015-12-22 NOTE — Telephone Encounter (Signed)
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  They were told he was not a patient.  pravastatin (PRAVACHOL) 80 MG

## 2015-12-22 NOTE — Telephone Encounter (Signed)
Synthroid and Pravachol sent to [facility] as requested.

## 2015-12-23 MED ORDER — LEVOTHYROXINE SODIUM 150 MCG PO TABS: 150.0000 ug | ORAL_TABLET | Freq: Every day | ORAL | Status: DC

## 2015-12-23 MED ORDER — PRAVASTATIN SODIUM 80 MG PO TABS: 80.0000 mg | ORAL_TABLET | Freq: Every day | ORAL | Status: DC

## 2015-12-23 NOTE — Telephone Encounter (Signed)
°  Patient made a mistake and would like medication please send to [place], [place],  [phone]

## 2015-12-23 NOTE — Addendum Note (Signed)
Addended byDamita Dunnings D on: 12/23/2015 09:53 AM   Modules accepted: Orders

## 2015-12-23 NOTE — Telephone Encounter (Signed)
Rx's resent to [facility] on [address].

## 2015-12-24 ENCOUNTER — Other Ambulatory Visit: Payer: Self-pay | Admitting: Internal Medicine

## 2016-02-16 ENCOUNTER — Telehealth: Payer: Self-pay | Admitting: Internal Medicine

## 2016-02-16 NOTE — Telephone Encounter (Signed)
Metoprolol was sent to [facility] on 12/24/2015 #90 tablets and 1 refill.

## 2016-02-16 NOTE — Telephone Encounter (Signed)
Caller name: Gabryel   Relationship to patient: Self   Can be reached: [phone]  Pharmacy: [facility] [place]  Reason for call: pt is requesting a Rx refill on her metoprolol succinate

## 2016-02-18 ENCOUNTER — Other Ambulatory Visit: Payer: Self-pay

## 2016-02-18 MED ORDER — METOPROLOL SUCCINATE ER 100 MG PO TB24: 100.0000 mg | ORAL_TABLET | Freq: Every day | ORAL | Status: DC

## 2016-04-19 ENCOUNTER — Ambulatory Visit (INDEPENDENT_AMBULATORY_CARE_PROVIDER_SITE_OTHER): Payer: Medicare Other | Admitting: Internal Medicine

## 2016-04-19 ENCOUNTER — Encounter: Payer: Self-pay | Admitting: Internal Medicine

## 2016-04-19 VITALS — BP 132/78 | HR 70 | Temp 97.9°F | Resp 16 | Ht 66.0 in | Wt 208.2 lb

## 2016-04-19 DIAGNOSIS — E039 Hypothyroidism, unspecified: Secondary | ICD-10-CM | POA: Diagnosis not present

## 2016-04-19 DIAGNOSIS — L83 Acanthosis nigricans: Secondary | ICD-10-CM | POA: Diagnosis not present

## 2016-04-19 DIAGNOSIS — Z09 Encounter for follow-up examination after completed treatment for conditions other than malignant neoplasm: Secondary | ICD-10-CM | POA: Insufficient documentation

## 2016-04-19 DIAGNOSIS — T7840XA Allergy, unspecified, initial encounter: Secondary | ICD-10-CM | POA: Diagnosis not present

## 2016-04-19 DIAGNOSIS — L80 Vitiligo: Secondary | ICD-10-CM | POA: Diagnosis not present

## 2016-04-19 MED ORDER — NYSTATIN-TRIAMCINOLONE 100000-0.1 UNIT/GM-% EX OINT: 1.0000 "application " | TOPICAL_OINTMENT | Freq: Two times a day (BID) | CUTANEOUS | Status: DC

## 2016-04-19 NOTE — Patient Instructions (Addendum)
GO TO THE LAB :      Get the blood work     GO TO THE FRONT DESK Schedule your next appointment for a  complete physical exam, fasting in 3 months   Allergic reaction: Go to the ER if severe symptoms (tongue lips) Zyrtec 10 mg OTC 1 tablet every night for 2 weeks Zantac 75 OTC: 2 tablets every night 2 weeks If you are not better, only a few days for a referral   Acanthosis nigricans: mycolog II cream twice a day for 10 days, then use powder

## 2016-04-19 NOTE — Assessment & Plan Note (Signed)
Hypothyroidism: Check a tsh Allergic reaction: "Hives" on and off and the forehead for the last 2 weeks, at night, no new exposures. Likely an allergic reaction, prescribed Zyrtec, Zantac, if no better in the next 2 or 3 days he will call me and we'll refer to dermatology or allergy. Acanthosis nigricans: Suspect the velvety skin at the groin is this condition. Recommend Mycolog twice a day for 10 days, then keep the area dry with powder Vitiliigo: Has not been told before that the penile patch is c/w this dx, the area has not changed in decades  RTC 06-2016, CPX

## 2016-04-19 NOTE — Progress Notes (Signed)
Subjective:    Patient ID: Dustin Wall, male    DOB: 12/02/1951, 65 y.o.   MRN: [id number]  DOS:  04/19/2016 Type of visit - description : Acute, multiple symptoms, here with his wife Interval history: For the last 2 weeks has hives on the forehead; his face get red and dry at the perinasal area, + itching face and forehead, symptoms are nocturnal, may last hours. Slightly sweaty. Symptoms are not worse when he takes a hot bath or shower. Denies any new pets, soaps, exposures.  Also, has developed a itchy rash at the groin for a while.  Request to check his thyroid.    Review of Systems Denies fever or chills No tongue or lip swelling Not itching at the feet or hands No weight loss, blurred vision, increased thirst No headaches Has some allergies: Runny nose, postnasal dripping, watery eyes. Nothing unexpected to him  for this time of the year  Past Medical History  Diagnosis Date  . Hypertension   . Hyperlipidemia   . Hypothyroidism     s/p hyperthyroidism, s/p radioiodine  . Ascending aortic aneurysm ([place])     dx 2010 ~ 4.4 cm    Past Surgical History  Procedure Laterality Date  . No past surgeries      Social History   Social History  . Marital Status: Married    Spouse Name: N/A  . Number of Children: 0  . Years of Education: N/A   Occupational History  . fully retired at age 17, sanitation    Social History Main Topics  . Smoking status: Never Smoker   . Smokeless tobacco: Never Used  . Alcohol Use: Yes  . Drug Use: No  . Sexual Activity: Not on file   Other Topics Concern  . Not on file   Social History Narrative   Lives w/ wife        Medication List       This list is accurate as of: 04/19/16  6:07 PM.  Always use your most recent med list.               levothyroxine 150 MCG tablet  Commonly known as:  SYNTHROID, LEVOTHROID  Take 1 tablet (150 mcg total) by mouth daily before breakfast.     metoprolol succinate 100 MG 24 hr  tablet  Commonly known as:  TOPROL-XL  Take 1 tablet (100 mg total) by mouth daily. Take with or immediately following a meal.     nystatin-triamcinolone ointment  Commonly known as:  MYCOLOG  Apply 1 application topically 2 (two) times daily.     pravastatin 80 MG tablet  Commonly known as:  PRAVACHOL  Take 1 tablet (80 mg total) by mouth daily.           Objective:   Physical Exam BP 132/78 mmHg  Pulse 70  Temp(Src) 97.9 F (36.6 C) (Oral)  Resp 16  Ht 5\' 6"  (1.676 m)  Wt 208 lb 4 oz (94.462 kg)  BMI 33.63 kg/m2  SpO2 99%  General:   Well developed, well nourished . NAD.  Neck: No  Thyromegaly  HEENT:  Normocephalic . Face symmetric, atraumatic Lungs:  CTA B Normal respiratory effort, no intercostal retractions, no accessory muscle use. Heart: RRR,  no murmur.  No pretibial edema bilaterally  Abdomen:  Not distended, soft, non-tender. No rebound or rigidity.   Skin:  Forehead with no active blister but he does have some patchy hyperpigmentation (from previous blisters?) Face  without redness Groins, velvety changes bilaterally Hands: Normal GU: White patch at the penis consistent with vitiligo Neurologic:  alert & oriented X3.  Speech normal, gait appropriate for age and unassisted Strength symmetric and appropriate for age.  Psych: Cognition and judgment appear intact.  Cooperative with normal attention span and concentration.  Behavior appropriate. No anxious or depressed appearing.    Assessment & Plan:   Assessment HTN Hyperlipidemia Hypothyroidism (hypothyroidism  > S/P  radioiodine) Ascending aortic aneurysm DX 2010, 4.5 cm. MRA 08-2015: 4.5 cm; Dr. Roxan Hockey Vitiligo  PLAN: Hypothyroidism: Check a tsh Allergic reaction: "Hives" on and off and the forehead for the last 2 weeks, at night, no new exposures. Likely an allergic reaction, prescribed Zyrtec, Zantac, if no better in the next 2 or 3 days he will call me and we'll refer to dermatology  or allergy. Acanthosis nigricans: Suspect the velvety skin at the groin is this condition. Recommend Mycolog twice a day for 10 days, then keep the area dry with powder Vitiliigo: Has not been told before that the penile patch is c/w this dx, the area has not changed in decades  RTC 06-2016, CPX  Today, I spent more than  27  min with the patient: >50% of the time counseling regards the multiple questions they have about acanthosis nigricans, vitiligo, itching management.

## 2016-04-19 NOTE — Progress Notes (Signed)
Pre visit review using our clinic review tool, if applicable. No additional management support is needed unless otherwise documented below in the visit note/SLS  

## 2016-04-20 ENCOUNTER — Telehealth: Payer: Self-pay | Admitting: Internal Medicine

## 2016-04-20 LAB — TSH: TSH: 0.84 u[IU]/mL (ref 0.35–4.50)

## 2016-04-20 NOTE — Telephone Encounter (Signed)
Caller name: Pharmacy  Can be reached: [phone]   Pharmacy:  [facility] [place], [place] [phone] ([phone] (Fax)        Reason for call: Pharmacy is requesting that the nystatin-triamcinolone ointment [facility]) [id number] that was sent in yesterday be split. States that the insurance will not cover it together

## 2016-04-21 MED ORDER — KETOCONAZOLE 2 % EX CREA: 1.0000 "application " | TOPICAL_CREAM | Freq: Two times a day (BID) | CUTANEOUS | Status: DC

## 2016-04-21 MED ORDER — HYDROCORTISONE 2.5 % EX CREA: TOPICAL_CREAM | Freq: Two times a day (BID) | CUTANEOUS | Status: DC

## 2016-04-21 NOTE — Telephone Encounter (Signed)
Unable to reach patient at this time.  Will try again later.

## 2016-04-21 NOTE — Telephone Encounter (Signed)
New prescriptions sent, advise patient he will have to mix the 2 medications 50/50 and use it twice a day.

## 2016-04-26 NOTE — Telephone Encounter (Signed)
Detailed message left with the below details.    KP

## 2016-07-15 ENCOUNTER — Encounter: Payer: BLUE CROSS/BLUE SHIELD | Admitting: Internal Medicine

## 2016-08-23 ENCOUNTER — Other Ambulatory Visit: Payer: Self-pay | Admitting: Thoracic Surgery (Cardiothoracic Vascular Surgery)

## 2016-08-23 DIAGNOSIS — I712 Thoracic aortic aneurysm, without rupture: Secondary | ICD-10-CM

## 2016-08-23 DIAGNOSIS — I7121 Aneurysm of the ascending aorta, without rupture: Secondary | ICD-10-CM

## 2016-08-25 ENCOUNTER — Telehealth: Payer: Self-pay | Admitting: Internal Medicine

## 2016-08-25 MED ORDER — METOPROLOL SUCCINATE ER 100 MG PO TB24: 100.0000 mg | ORAL_TABLET | Freq: Every day | ORAL | 0 refills | Status: DC

## 2016-08-25 NOTE — Telephone Encounter (Signed)
Please inform Pt that Rx has been sent to [facility]. Thank you.

## 2016-08-25 NOTE — Telephone Encounter (Signed)
°  Relation to [id number] Call back number:[phone] Pharmacy:[facility] [address]  Reason for call: pt is needing rx metoprolol succinate (TOPROL-XL) 100 MG 24 hr tablet, please call to confirm rx has been sent

## 2016-09-07 ENCOUNTER — Encounter: Payer: Self-pay | Admitting: Internal Medicine

## 2016-09-07 ENCOUNTER — Ambulatory Visit (INDEPENDENT_AMBULATORY_CARE_PROVIDER_SITE_OTHER): Payer: Medicare Other | Admitting: Internal Medicine

## 2016-09-07 VITALS — BP 126/70 | HR 77 | Temp 97.6°F | Resp 12 | Ht 66.0 in | Wt 208.0 lb

## 2016-09-07 DIAGNOSIS — Z0000 Encounter for general adult medical examination without abnormal findings: Secondary | ICD-10-CM | POA: Diagnosis not present

## 2016-09-07 DIAGNOSIS — E785 Hyperlipidemia, unspecified: Secondary | ICD-10-CM

## 2016-09-07 DIAGNOSIS — I10 Essential (primary) hypertension: Secondary | ICD-10-CM

## 2016-09-07 DIAGNOSIS — Z125 Encounter for screening for malignant neoplasm of prostate: Secondary | ICD-10-CM | POA: Diagnosis not present

## 2016-09-07 DIAGNOSIS — L83 Acanthosis nigricans: Secondary | ICD-10-CM

## 2016-09-07 DIAGNOSIS — E039 Hypothyroidism, unspecified: Secondary | ICD-10-CM

## 2016-09-07 DIAGNOSIS — R202 Paresthesia of skin: Secondary | ICD-10-CM

## 2016-09-07 MED ORDER — TRIAMCINOLONE ACETONIDE 0.025 % EX OINT: 1.0000 "application " | TOPICAL_OINTMENT | Freq: Every day | CUTANEOUS | 2 refills | Status: DC | PRN

## 2016-09-07 NOTE — Assessment & Plan Note (Addendum)
Td ; pnm shot, zostavax, flu shot--- declined, benefits discussed Colon cancer screening: Normal colonoscopy December 2012 (had external hemorrhoids) Prostate cancer screening:  +FH , DRE wnl today, check a PSA  Diet and exercise discussed

## 2016-09-07 NOTE — Progress Notes (Signed)
Subjective:    Patient ID: Dustin Wall, male    DOB: 09/19/1951, 65 y.o.   MRN: [id number]  DOS:  09/07/2016 Type of visit - description :  Here for Medicare AWV: 1. Risk factors based on Past M, S, F history: reviewed 2. Physical Activities:  Walks daily, has a stationary bike,tennis, b-ball 3. Depression/mood: neg screening  4. Hearing:  No problems noted or reported  5. ADL's: independent, drives  6. Fall Risk: no recent falls, prevention discussed , see AVS 7. home Safety: does feel safe at home  8. Height, weight, & visual acuity: see VS, due to see the eye doctor , encouraged to do 9. Counseling: provided 10. Labs ordered based on risk factors: if needed  11. Referral Coordination: if needed 12. Care Plan, see assessment and plan , written personalized plan provided , see AVS 13. Cognitive Assessment: motor skills and cognition appropriate for age 58. Care team updated   15. End-of-life  - rec HC POA, advise directives  paperwork provided  In addition, today we discussed the following: High cholesterol: Good compliance with medications Hypothyroidism: on Synthroid, good compliance Complains of itching at the GU area, request a prescription for triamcinolone.  Review of Systems Constitutional: No fever. No chills. No unexplained wt changes. No unusual sweats  HEENT: No dental problems, no ear discharge, no facial swelling, no voice changes. No eye discharge, no eye  redness , no  intolerance to light   Respiratory: No wheezing , no  difficulty breathing. No cough , no mucus production  Cardiovascular: No CP, no leg swelling , no  Palpitations  GI: no nausea, no vomiting, no diarrhea , no  abdominal pain.  No blood in the stools. No dysphagia, no odynophagia    Endocrine: No polyphagia, no polyuria , no polydipsia  GU: No dysuria, gross hematuria, difficulty urinating. No urinary urgency, no frequency.  Musculoskeletal: No joint swellings or unusual aches or  pains  Skin: No change in the color of the skin, palor Few months ago had a vesicular rash on the internal aspect of the right pretibial area, since then is having a burning sensation on the right leg. See graphic Allergic, immunologic: No environmental allergies , no  food allergies  Neurological: No dizziness no  syncope. No headaches. No diplopia, no slurred, no slurred speech, no motor deficits, no facial  Numbness  Hematological: No enlarged lymph nodes, no easy bruising , no unusual bleedings  Psychiatry: No suicidal ideas, no hallucinations, no beavior problems, no confusion.  No unusual/severe anxiety, no depression   Past Medical History:  Diagnosis Date  . Ascending aortic aneurysm ([place])    dx 2010 ~ 4.4 cm  . Hyperlipidemia   . Hypertension   . Hypothyroidism    s/p hyperthyroidism, s/p radioiodine    Past Surgical History:  Procedure Laterality Date  . NO PAST SURGERIES      Social History   Social History  . Marital status: Married    Spouse name: N/A  . Number of children: 0  . Years of education: N/A   Occupational History  . fully retired at age 8, sanitation    Social History Main Topics  . Smoking status: Never Smoker  . Smokeless tobacco: Never Used  . Alcohol use Yes  . Drug use: No  . Sexual activity: Not on file   Other Topics Concern  . Not on file   Social History Narrative   Lives w/ wife  Family History  Problem Relation Age of Onset  . Prostate cancer Brother     2 brothers  . Hypertension Other     several fam members   . Colon cancer Neg Hx   . CAD Neg Hx   . Diabetes Neg Hx   . Stroke Neg Hx        Medication List       Accurate as of 09/07/16  1:44 PM. Always use your most recent med list.          hydrocortisone 2.5 % cream Apply topically 2 (two) times daily.   ketoconazole 2 % cream Commonly known as:  NIZORAL Apply 1 application topically 2 (two) times daily.   levothyroxine 150 MCG  tablet Commonly known as:  SYNTHROID, LEVOTHROID Take 1 tablet (150 mcg total) by mouth daily before breakfast.   metoprolol succinate 100 MG 24 hr tablet Commonly known as:  TOPROL-XL Take 1 tablet (100 mg total) by mouth daily. Take with or immediately following a meal.   pravastatin 80 MG tablet Commonly known as:  PRAVACHOL Take 1 tablet (80 mg total) by mouth daily.          Objective:   Physical Exam  Skin:      BP 126/70 (BP Location: Left Arm, Patient Position: Sitting, Cuff Size: Normal)   Pulse 77   Temp 97.6 F (36.4 C) (Oral)   Resp 12   Ht 5\' 6"  (1.676 m)   Wt 208 lb (94.3 kg)   SpO2 97%   BMI 33.57 kg/m   General:   Well developed, well nourished . NAD.  Neck: No  thyromegaly  HEENT:  Normocephalic . Face symmetric, atraumatic Lungs:  CTA B Normal respiratory effort, no intercostal retractions, no accessory muscle use. Heart: RRR,  no murmur.  No pretibial edema bilaterally  Abdomen:  Not distended, soft, non-tender. No rebound or rigidity.   Rectal:  External abnormalities: none. Normal sphincter tone. No rectal masses or tenderness.  No stools found Prostate: Prostate gland firm and smooth, no enlargement, nodularity, tenderness, mass, asymmetry or induration.  Skin:  Inguinal areas with velvety changes. The skin between the anus and the scrotum where the patient itches is also velvety , there is no thinning on telangiectasias. Neurologic:  alert & oriented X3.  Speech normal, gait appropriate for age and unassisted Strength symmetric and appropriate for age.  Psych: Cognition and judgment appear intact.  Cooperative with normal attention span and concentration.  Behavior appropriate. No anxious or depressed appearing.    Assessment & Plan:   Assessment HTN Hyperlipidemia Hypothyroidism (hypothyroidism  > S/P  radioiodine) Ascending aortic aneurysm DX 2010, 4.5 cm. MRA 08-2015: 4.5 cm; Dr. Roxan Hockey Vitiligo Acanthosis  Nigricans   PLAN: HTN: Continue metoprolol, checking a CMP and CBC Hypothyroidism: On Synthroid, check a TSH Hyperlipidemia: On high-dose Pravachol, check an FLP Acanthosis Nigricans: velvete changes at groin at GU area; uses chronically triamcinolone ointment between the anus and the scrotum  for chronic itching. No evidence of thinning or telangiectasias. I agreed to prescribe it to be use on limited basis. Paresthesia, right leg as described above, area is not on a dermatome pattern, recommend observation. RTC a months

## 2016-09-07 NOTE — Progress Notes (Signed)
Pre visit review using our clinic review tool, if applicable. No additional management support is needed unless otherwise documented below in the visit note. 

## 2016-09-07 NOTE — Patient Instructions (Signed)
Get your blood work before you leave   Use triamcinolone once a day as needed only.  Next visit 8 months    Fall Prevention and Home Safety  Falls cause injuries and can affect all age groups. It is possible to use preventive measures to significantly decrease the likelihood of falls. There are many simple measures which can make your home safer and prevent falls. OUTDOORS  Repair cracks and edges of walkways and driveways.  Remove high doorway thresholds.  Trim shrubbery on the main path into your home.  Have good outside lighting.  Clear walkways of tools, rocks, debris, and clutter.  Check that handrails are not broken and are securely fastened. Both sides of steps should have handrails.  Have leaves, snow, and ice cleared regularly.  Use sand or salt on walkways during winter months.  In the garage, clean up grease or oil spills. BATHROOM  Install night lights.  Install grab bars by the toilet and in the tub and shower.  Use non-skid mats or decals in the tub or shower.  Place a plastic non-slip stool in the shower to sit on, if needed.  Keep floors dry and clean up all water on the floor immediately.  Remove soap buildup in the tub or shower on a regular basis.  Secure bath mats with non-slip, double-sided rug tape.  Remove throw rugs and tripping hazards from the floors. BEDROOMS  Install night lights.  Make sure a bedside light is easy to reach.  Do not use oversized bedding.  Keep a telephone by your bedside.  Have a firm chair with side arms to use for getting dressed.  Remove throw rugs and tripping hazards from the floor. KITCHEN  Keep handles on pots and pans turned toward the center of the stove. Use back burners when possible.  Clean up spills quickly and allow time for drying.  Avoid walking on wet floors.  Avoid hot utensils and knives.  Position shelves so they are not too high or low.  Place commonly used objects within easy  reach.  If necessary, use a sturdy step stool with a grab bar when reaching.  Keep electrical cables out of the way.  Do not use floor polish or wax that makes floors slippery. If you must use wax, use non-skid floor wax.  Remove throw rugs and tripping hazards from the floor. STAIRWAYS  Never leave objects on stairs.  Place handrails on both sides of stairways and use them. Fix any loose handrails. Make sure handrails on both sides of the stairways are as long as the stairs.  Check carpeting to make sure it is firmly attached along stairs. Make repairs to worn or loose carpet promptly.  Avoid placing throw rugs at the top or bottom of stairways, or properly secure the rug with carpet tape to prevent slippage. Get rid of throw rugs, if possible.  Have an electrician put in a light switch at the top and bottom of the stairs. OTHER FALL PREVENTION TIPS  Wear low-heel or rubber-soled shoes that are supportive and fit well. Wear closed toe shoes.  When using a stepladder, make sure it is fully opened and both spreaders are firmly locked. Do not climb a closed stepladder.  Add color or contrast paint or tape to grab bars and handrails in your home. Place contrasting color strips on first and last steps.  Learn and use mobility aids as needed. Install an electrical emergency response system.  Turn on lights to avoid dark  areas. Replace light bulbs that burn out immediately. Get light switches that glow.  Arrange furniture to create clear pathways. Keep furniture in the same place.  Firmly attach carpet with non-skid or double-sided tape.  Eliminate uneven floor surfaces.  Select a carpet pattern that does not visually hide the edge of steps.  Be aware of all pets. OTHER HOME SAFETY TIPS  Set the water temperature for 120 F (48.8 C).  Keep emergency numbers on or near the telephone.  Keep smoke detectors on every level of the home and near sleeping areas. Document Released:  12/02/2002 Document Revised: 06/12/2012 Document Reviewed: 03/02/2012 [facility] Patient Information 2015 [place], [place]. This information is not intended to replace advice given to you by your health care provider. Make sure you discuss any questions you have with your health care provider.   Preventive Care for Adults Ages 71 and over  Blood pressure check.** / Every 1 to 2 years.  Lipid and cholesterol check.**/ Every 5 years beginning at age 17.  Lung cancer screening. / Every year if you are aged 77-80 years and have a 30-pack-year history of smoking and currently smoke or have quit within the past 15 years. Yearly screening is stopped once you have quit smoking for at least 15 years or develop a health problem that would prevent you from having lung cancer treatment.  Fecal occult blood test (FOBT) of stool. / Every year beginning at age 39 and continuing until age 53. You may not have to do this test if you get a colonoscopy every 10 years.  Flexible sigmoidoscopy** or colonoscopy.** / Every 5 years for a flexible sigmoidoscopy or every 10 years for a colonoscopy beginning at age 69 and continuing until age 50.  Hepatitis C blood test.** / For all people born from [birth year] through 1966 and any individual with known risks for hepatitis C.  Abdominal aortic aneurysm (AAA) screening.** / A one-time screening for ages 22 to 53 years who are current or former smokers.  Skin self-exam. / Monthly.  Influenza vaccine. / Every year.  Tetanus, diphtheria, and acellular pertussis (Tdap/Td) vaccine.** / 1 dose of Td every 10 years.  Varicella vaccine.** / Consult your health care provider.  Zoster vaccine.** / 1 dose for adults aged 76 years or older.  Pneumococcal 13-valent conjugate (PCV13) vaccine.** / Consult your health care provider.  Pneumococcal polysaccharide (PPSV23) vaccine.** / 1 dose for all adults aged 58 years and older.  Meningococcal vaccine.** / Consult your health care  provider.  Hepatitis A vaccine.** / Consult your health care provider.  Hepatitis B vaccine.** / Consult your health care provider.  Haemophilus influenzae type b (Hib) vaccine.** / Consult your health care provider. **Family history and personal history of risk and conditions may change your health care provider's recommendations. Document Released: 02/07/2002 Document Revised: 12/17/2013 Document Reviewed: 05/09/2011 [facility] Patient Information 2015 [place], [place]. This information is not intended to replace advice given to you by your health care provider. Make sure you discuss any questions you have with your health care provider.

## 2016-09-08 LAB — LIPID PANEL
CHOLESTEROL: 235 mg/dL — AB (ref 0–200)
HDL: 45.7 mg/dL (ref >39.00)
LDL CALC: 154 mg/dL — AB (ref 0–99)
NonHDL: 188.91
TRIGLYCERIDES: 174 mg/dL — AB (ref 0.0–149.0)
Total CHOL/HDL Ratio: 5
VLDL: 34.8 mg/dL (ref 0.0–40.0)

## 2016-09-08 LAB — COMPREHENSIVE METABOLIC PANEL
ALBUMIN: 4.3 g/dL (ref 3.5–5.2)
ALK PHOS: 56 U/L (ref 39–117)
ALT: 20 U/L (ref 0–53)
AST: 19 U/L (ref 0–37)
BUN: 11 mg/dL (ref 6–23)
CALCIUM: 9.5 mg/dL (ref 8.4–10.5)
CHLORIDE: 101 meq/L (ref 96–112)
CO2: 31 mEq/L (ref 19–32)
CREATININE: 0.88 mg/dL (ref 0.40–1.50)
GFR: 111.39 mL/min (ref >60.00)
Glucose, Bld: 82 mg/dL (ref 70–99)
Potassium: 4.1 mEq/L (ref 3.5–5.1)
Sodium: 137 mEq/L (ref 135–145)
Total Bilirubin: 0.7 mg/dL (ref 0.2–1.2)
Total Protein: 7.5 g/dL (ref 6.0–8.3)

## 2016-09-08 LAB — CBC WITH DIFFERENTIAL/PLATELET
BASOS PCT: 0.6 % (ref 0.0–3.0)
Basophils Absolute: 0 10*3/uL (ref 0.0–0.1)
EOS ABS: 0.1 10*3/uL (ref 0.0–0.7)
EOS PCT: 1.8 % (ref 0.0–5.0)
HEMATOCRIT: 44.9 % (ref 39.0–52.0)
Hemoglobin: 14.9 g/dL (ref 13.0–17.0)
LYMPHS PCT: 52.4 % — AB (ref 12.0–46.0)
Lymphs Abs: 2.4 10*3/uL (ref 0.7–4.0)
MCHC: 33.1 g/dL (ref 30.0–36.0)
MCV: 83.5 fl (ref 78.0–100.0)
MONOS PCT: 8.4 % (ref 3.0–12.0)
Monocytes Absolute: 0.4 10*3/uL (ref 0.1–1.0)
NEUTROS ABS: 1.7 10*3/uL (ref 1.4–7.7)
Neutrophils Relative %: 36.8 % — ABNORMAL LOW (ref 43.0–77.0)
PLATELETS: 213 10*3/uL (ref 150.0–400.0)
RBC: 5.37 Mil/uL (ref 4.22–5.81)
RDW: 15.4 % (ref 11.5–15.5)
WBC: 4.5 10*3/uL (ref 4.0–10.5)

## 2016-09-08 LAB — TSH: TSH: 2.39 u[IU]/mL (ref 0.35–4.50)

## 2016-09-08 LAB — PSA, MEDICARE: PSA: 2.06 ng/ml (ref 0.10–4.00)

## 2016-09-08 NOTE — Assessment & Plan Note (Signed)
HTN: Continue metoprolol, checking a CMP and CBC Hypothyroidism: On Synthroid, check a TSH Hyperlipidemia: On high-dose Pravachol, check an FLP Acanthosis Nigricans: velvete changes at groin at GU area; uses chronically triamcinolone ointment between the anus and the scrotum  for chronic itching. No evidence of thinning or telangiectasias. I agreed to prescribe it to be use on limited basis. Paresthesia, right leg as described above, area is not on a dermatome pattern, recommend observation. RTC a months

## 2016-09-13 ENCOUNTER — Ambulatory Visit: Payer: Medicare Other | Admitting: Thoracic Surgery (Cardiothoracic Vascular Surgery)

## 2016-09-13 MED ORDER — ATORVASTATIN CALCIUM 40 MG PO TABS: 40.0000 mg | ORAL_TABLET | Freq: Every day | ORAL | 0 refills | Status: DC

## 2016-09-13 NOTE — Addendum Note (Signed)
Addended byDamita Dunnings D on: 09/13/2016 05:01 PM   Modules accepted: Orders

## 2016-11-21 ENCOUNTER — Telehealth: Payer: Self-pay | Admitting: Internal Medicine

## 2016-11-21 MED ORDER — METOPROLOL SUCCINATE ER 100 MG PO TB24: 100.0000 mg | ORAL_TABLET | Freq: Every day | ORAL | 2 refills | Status: DC

## 2016-11-21 NOTE — Telephone Encounter (Signed)
Rx sent 

## 2016-11-21 NOTE — Telephone Encounter (Signed)
°  Relation to [id number] Call back number:[phone] Pharmacy:[facility] [address]  Reason for call: pt is needing refill on rx  metoprolol succinate (TOPROL-XL) 100 MG 24 hr tablet, 90 day supply

## 2016-12-28 ENCOUNTER — Telehealth: Payer: Self-pay | Admitting: Internal Medicine

## 2016-12-28 NOTE — Telephone Encounter (Signed)
error:[id number] ° °

## 2016-12-30 ENCOUNTER — Other Ambulatory Visit (INDEPENDENT_AMBULATORY_CARE_PROVIDER_SITE_OTHER): Payer: Medicare Other

## 2016-12-30 DIAGNOSIS — E785 Hyperlipidemia, unspecified: Secondary | ICD-10-CM | POA: Diagnosis not present

## 2016-12-30 LAB — LIPID PANEL
CHOLESTEROL: 161 mg/dL (ref 0–200)
HDL: 50.8 mg/dL (ref >39.00)
LDL CALC: 93 mg/dL (ref 0–99)
NONHDL: 110.03
Total CHOL/HDL Ratio: 3
Triglycerides: 85 mg/dL (ref 0.0–149.0)
VLDL: 17 mg/dL (ref 0.0–40.0)

## 2016-12-30 LAB — ALT: ALT: 44 U/L (ref 0–53)

## 2016-12-30 LAB — AST: AST: 28 U/L (ref 0–37)

## 2017-01-02 MED ORDER — ATORVASTATIN CALCIUM 40 MG PO TABS: 40.0000 mg | ORAL_TABLET | Freq: Every day | ORAL | 1 refills | Status: DC

## 2017-01-02 NOTE — Addendum Note (Signed)
Addended byDamita Dunnings D on: 01/02/2017 07:45 AM   Modules accepted: Orders

## 2017-02-21 ENCOUNTER — Telehealth: Payer: Self-pay | Admitting: Internal Medicine

## 2017-02-21 MED ORDER — LEVOTHYROXINE SODIUM 150 MCG PO TABS: 150.0000 ug | ORAL_TABLET | Freq: Every day | ORAL | 1 refills | Status: DC

## 2017-02-21 NOTE — Telephone Encounter (Signed)
Caller name: Erza Relation to pt: self  Call back [phone] Pharmacy: [facility], [place]  Reason for call: Pt came in office stating is needing a refill on levothyroxine (SYNTHROID, LEVOTHROID) 150 MCG tablet. Please advise.

## 2017-02-21 NOTE — Telephone Encounter (Signed)
Rx sent 

## 2017-03-09 DIAGNOSIS — L249 Irritant contact dermatitis, unspecified cause: Secondary | ICD-10-CM | POA: Diagnosis not present

## 2017-05-08 ENCOUNTER — Encounter: Payer: Self-pay | Admitting: Internal Medicine

## 2017-05-08 ENCOUNTER — Ambulatory Visit (INDEPENDENT_AMBULATORY_CARE_PROVIDER_SITE_OTHER): Payer: Medicare Other | Admitting: Internal Medicine

## 2017-05-08 VITALS — BP 122/74 | HR 70 | Temp 97.5°F | Resp 14 | Ht 66.0 in | Wt 202.2 lb

## 2017-05-08 DIAGNOSIS — Z1159 Encounter for screening for other viral diseases: Secondary | ICD-10-CM

## 2017-05-08 DIAGNOSIS — E039 Hypothyroidism, unspecified: Secondary | ICD-10-CM | POA: Diagnosis not present

## 2017-05-08 DIAGNOSIS — I10 Essential (primary) hypertension: Secondary | ICD-10-CM | POA: Diagnosis not present

## 2017-05-08 DIAGNOSIS — I712 Thoracic aortic aneurysm, without rupture: Secondary | ICD-10-CM | POA: Diagnosis not present

## 2017-05-08 DIAGNOSIS — I7121 Aneurysm of the ascending aorta, without rupture: Secondary | ICD-10-CM

## 2017-05-08 LAB — TSH: TSH: 0.09 u[IU]/mL — ABNORMAL LOW (ref 0.35–4.50)

## 2017-05-08 NOTE — Progress Notes (Signed)
Pre visit review using our clinic review tool, if applicable. No additional management support is needed unless otherwise documented below in the visit note. 

## 2017-05-08 NOTE — Progress Notes (Signed)
Subjective:    Patient ID: Dustin Wall, male    DOB: 1951/10/16, 66 y.o.   MRN: [id number]  DOS:  05/08/2017 Type of visit - description : Routine visit, here with his wife Interval history: No major concerns. Good compliance w/ medication. Ambulatory BPs when checked normal Due to see cardiothoracic surgery. Does not have an appointment.  Review of Systems From time to time, can hear his heartbeat at night when he lays down. Denies tinnitus, palpitations or decreased hearing. No chest pain, difficulty breathing or lower extremity edema  Past Medical History:  Diagnosis Date  . Ascending aortic aneurysm ([place])    dx 2010 ~ 4.4 cm  . Hyperlipidemia   . Hypertension   . Hypothyroidism    s/p hyperthyroidism, s/p radioiodine    Past Surgical History:  Procedure Laterality Date  . NO PAST SURGERIES      Social History   Social History  . Marital status: Married    Spouse name: N/A  . Number of children: 0  . Years of education: N/A   Occupational History  . fully retired at age 1, sanitation    Social History Main Topics  . Smoking status: Never Smoker  . Smokeless tobacco: Never Used  . Alcohol use Yes  . Drug use: No  . Sexual activity: Not on file   Other Topics Concern  . Not on file   Social History Narrative   Lives w/ wife      Allergies as of 05/08/2017   No Known Allergies     Medication List       Accurate as of 05/08/17 11:59 PM. Always use your most recent med list.          atorvastatin 40 MG tablet Commonly known as:  LIPITOR Take 1 tablet (40 mg total) by mouth at bedtime.   levothyroxine 150 MCG tablet Commonly known as:  SYNTHROID, LEVOTHROID Take 1 tablet (150 mcg total) by mouth daily before breakfast.   metoprolol succinate 100 MG 24 hr tablet Commonly known as:  TOPROL-XL Take 1 tablet (100 mg total) by mouth daily. Take with or immediately following a meal.   triamcinolone 0.025 % ointment Commonly known as:   KENALOG Apply 1 application topically daily as needed.          Objective:   Physical Exam BP 122/74 (BP Location: Left Arm, Patient Position: Sitting, Cuff Size: Normal)   Pulse 70   Temp 97.5 F (36.4 C) (Oral)   Resp 14   Ht 5\' 6"  (1.676 m)   Wt 202 lb 4 oz (91.7 kg)   SpO2 98%   BMI 32.64 kg/m  General:   Well developed, well nourished . NAD.  HEENT:  Normocephalic . Face symmetric, atraumatic. TMs normal Lungs:  CTA B Normal respiratory effort, no intercostal retractions, no accessory muscle use. Heart: RRR,  no murmur.  No pretibial edema bilaterally  Skin: Not pale. Not jaundice Neurologic:  alert & oriented X3.  Speech normal, gait appropriate for age and unassisted Psych--  Cognition and judgment appear intact.  Cooperative with normal attention span and concentration.  Behavior appropriate. No anxious or depressed appearing.     Assessment & Plan:   Assessment HTN Hyperlipidemia Hypothyroidism (hypothyroidism  > S/P  radioiodine) Ascending aortic aneurysm DX 2010, 4.5 cm. MRA 08-2015: 4.5 cm; Dr. Roxan Hockey Vitiligo Acanthosis Nigricans   PLAN: HTN: Well-controlled on metoprolol, last CMP and CBC satisfactory Hypothyroidism: On Synthroid, check a TSH Ascending aortic aneurysm:  Due to see Dr. Roxan Hockey, due to cost he has been putting this off, risks of enlarging aortic aneurysm discuss, will call when ready for a referral. Currently asx RTC 08-2017---Medicare wellness and routine checkup

## 2017-05-08 NOTE — Patient Instructions (Signed)
GO TO THE LAB : Get the blood work     GO TO THE FRONT DESK Schedule your next appointment for a Medicare wellness with one of our nurses and a routine checkup with me for September 2018

## 2017-05-09 LAB — HEPATITIS C ANTIBODY: HCV Ab: NEGATIVE

## 2017-05-09 NOTE — Assessment & Plan Note (Signed)
HTN: Well-controlled on metoprolol, last CMP and CBC satisfactory Hypothyroidism: On Synthroid, check a TSH Ascending aortic aneurysm: Due to see Dr. Roxan Hockey, due to cost he has been putting this off, risks of enlarging aortic aneurysm discuss, will call when ready for a referral. Currently asx RTC 08-2017---Medicare wellness and routine checkup

## 2017-05-11 ENCOUNTER — Other Ambulatory Visit: Payer: Self-pay

## 2017-05-11 MED ORDER — LEVOTHYROXINE SODIUM 125 MCG PO TABS: 125.0000 ug | ORAL_TABLET | Freq: Every day | ORAL | 2 refills | Status: DC

## 2017-06-14 ENCOUNTER — Other Ambulatory Visit (INDEPENDENT_AMBULATORY_CARE_PROVIDER_SITE_OTHER): Payer: Medicare Other

## 2017-06-14 ENCOUNTER — Telehealth: Payer: Self-pay | Admitting: Internal Medicine

## 2017-06-14 DIAGNOSIS — E039 Hypothyroidism, unspecified: Secondary | ICD-10-CM | POA: Diagnosis not present

## 2017-06-14 LAB — TSH: TSH: 0.84 u[IU]/mL (ref 0.35–4.50)

## 2017-06-14 NOTE — Telephone Encounter (Signed)
LMOM informing Pt that per notes in 04/2016 he should be on levothyroxine (Synthroid) 118mcg daily, and to recheck TSH in 6 weeks (which Pt came into office today to do), informed him labs from today are not back yet and once we had lab results we would let him know if dosage to stay the same or to change.

## 2017-06-14 NOTE — Telephone Encounter (Signed)
Relation to pt: self  Call back number: [phone]   Reason for call:  Patient in need of clinical advice regarding he's levothyroxine (SYNTHROID, LEVOTHROID) 125 MCG tablet patient unsure if he should take medication or continue taking the 150, please advise

## 2017-07-17 ENCOUNTER — Telehealth: Payer: Self-pay | Admitting: Internal Medicine

## 2017-07-17 MED ORDER — LEVOTHYROXINE SODIUM 125 MCG PO TABS: 125.0000 ug | ORAL_TABLET | Freq: Every day | ORAL | 3 refills | Status: DC

## 2017-07-17 NOTE — Telephone Encounter (Signed)
Caller name: Relation to JZ:PHXT Call back number: [phone] Pharmacy: [facility] [address]  Reason for call: PT IS NEEDING RX levothyroxine (SYNTHROID, LEVOTHROID) 125 MCG tablet, 90 DAY SUPPLY

## 2017-07-17 NOTE — Telephone Encounter (Signed)
Rx sent 

## 2017-07-20 NOTE — Telephone Encounter (Signed)
Pt came in office very upset that RX was sent for 30 days supply instead of 90. He has picked up today for a 30 day supply and already paid but would like sent for 90 day supply. I advised it appears it was an error and pharmacy could have called for VO for 90 day (or possible filled for 90 days since RX has 3 refills). Pt would like RX resent for 90 day supply for next month.

## 2017-07-20 NOTE — Telephone Encounter (Signed)
Per AVS 05/08/17 pt was scheduled for appt 09/05/17 (AWV with Glenard Haring and f/u with Dr. Larose Kells)  GO TO THE FRONT DESK Schedule your next appointment for a Medicare wellness with one of our nurses and a routine checkup with me for September 2018

## 2017-07-20 NOTE — Telephone Encounter (Signed)
30 day supply was given d/t fact Pt is overdue for routine follow-up w/ PCP.

## 2017-08-30 NOTE — Progress Notes (Signed)
Subjective:   Dustin Wall is a 66 y.o. male who presents for Medicare Annual/Subsequent preventive examination. Here with wife.136/76  Review of Systems:  No ROS.  Medicare Wellness Visit. Additional risk factors are reflected in the social history.  Cardiac Risk Factors include: advanced age (>16men, >7 women);hypertension;male gender;dyslipidemia Sleep patterns: Feels rested. Wife concerned about pt's breathing while he sleeps. Will address with PCP today.   Home Safety/Smoke Alarms: Feels safe in home. Smoke alarms in place.  Seat Belt Safety/Bike Helmet: Wears seat belt.  Male:   [id number]     PSA-  Lab Results  Component Value Date   PSA 2.06 09/07/2016   PSA 1.95 07/13/2015   PSA 1.94 01/28/2015       Objective:    Vitals: BP 136/76 (BP Location: Right Arm, Patient Position: Sitting, Cuff Size: Normal)   Pulse 66   Ht 5\' 6"  (1.676 m)   Wt 202 lb (91.6 kg)   SpO2 98%   BMI 32.60 kg/m   Body mass index is 32.6 kg/m.  Tobacco History  Smoking Status  . Never Smoker  Smokeless Tobacco  . Never Used     Counseling given: Not Answered   Past Medical History:  Diagnosis Date  . Ascending aortic aneurysm ([place])    dx 2010 ~ 4.4 cm  . Hyperlipidemia   . Hypertension   . Hypothyroidism    s/p hyperthyroidism, s/p radioiodine   Past Surgical History:  Procedure Laterality Date  . NO PAST SURGERIES     Family History  Problem Relation Age of Onset  . Prostate cancer Brother        2 brothers  . Hypertension Other        several fam members   . Colon cancer Neg Hx   . CAD Neg Hx   . Diabetes Neg Hx   . Stroke Neg Hx    History  Sexual Activity  . Sexual activity: Yes    Outpatient Encounter Prescriptions as of 09/05/2017  Medication Sig  . atorvastatin (LIPITOR) 40 MG tablet Take 1 tablet (40 mg total) by mouth at bedtime. (Patient taking differently: Take 40 mg by mouth daily. )  . levothyroxine (SYNTHROID, LEVOTHROID) 125 MCG tablet Take 1  tablet (125 mcg total) by mouth daily before breakfast.  . metoprolol succinate (TOPROL-XL) 100 MG 24 hr tablet Take 1 tablet (100 mg total) by mouth daily. Take with or immediately following a meal.  . triamcinolone (KENALOG) 0.025 % ointment Apply 1 application topically daily as needed.   No facility-administered encounter medications on file as of 09/05/2017.     Activities of Daily Living In your present state of health, do you have any difficulty performing the following activities: 09/05/2017 09/07/2016  Hearing? N N  Vision? N N  Comment Reading glasses.  -  Difficulty concentrating or making decisions? N N  Walking or climbing stairs? N N  Dressing or bathing? N N  Doing errands, shopping? N N  Preparing Food and eating ? N -  Using the Toilet? N -  In the past six months, have you accidently leaked urine? N -  Do you have problems with loss of bowel control? N -  Managing your Medications? N -  Managing your Finances? N -  Housekeeping or managing your Housekeeping? N -  Some recent data might be hidden    Patient Care Team: Colon Branch, MD as PCP - General (Internal Medicine) Zehr, Laban Emperor, PA-C as Physician  Assistant (Gastroenterology)   Assessment:    Physical assessment deferred to PCP.  Exercise Activities and Dietary recommendations Current Exercise Habits: Home exercise routine, Time (Minutes): 60, Frequency (Times/Week): 3, Weekly Exercise (Minutes/Week): 180, Intensity: Mild    Diet (meal preparation, eat out, water intake, caffeinated beverages, dairy products, fruits and vegetables): in general, a "healthy" diet         Goals      Patient Stated   . Take more vacations (pt-stated)      Fall Risk Fall Risk  09/05/2017 09/07/2016  Falls in the past year? No No   Depression Screen PHQ 2/9 Scores 09/05/2017 09/07/2016  PHQ - 2 Score 0 0    Cognitive Function Ad8 score reviewed for issues:  Issues making decisions:no  Less interest in hobbies /  activities:no  Repeats questions, stories (family complaining):no  Trouble using ordinary gadgets (microwave, computer, phone):no  Forgets the month or year: no  Mismanaging finances: no  Remembering appts:no  Daily problems with thinking and/or memory:no Ad8 score is=0        Immunization History  Administered Date(s) Administered  . Zoster 12/27/2011   Screening Tests Health Maintenance  Topic Date Due  . TETANUS/TDAP  09/06/2017 (Originally 07/29/1970)  . PNA vac Low Risk Adult (1 of 2 - PCV13) 09/06/2017 (Originally 07/30/2015)  . INFLUENZA VACCINE  09/25/2017 (Originally 07/26/2017)  . COLONOSCOPY  12/26/2020  . Hepatitis C Screening  Completed      Plan:   Follow up with PCP today as scheduled  Continue to eat heart healthy diet (full of fruits, vegetables, whole grains, lean protein, water--limit salt, fat, and sugar intake) and increase physical activity as tolerated.  Continue doing brain stimulating activities (puzzles, reading, adult coloring books, staying active) to keep memory sharp.   Bring a copy of your living will and/or healthcare power of attorney to your next office visit.  I have personally reviewed and noted the following in the patient's chart:   . Medical and social history . Use of alcohol, tobacco or illicit drugs  . Current medications and supplements . Functional ability and status . Nutritional status . Physical activity . Advanced directives . List of other physicians . Hospitalizations, surgeries, and ER visits in previous 12 months . Vitals . Screenings to include cognitive, depression, and falls . Referrals and appointments  In addition, I have reviewed and discussed with patient certain preventive protocols, quality metrics, and best practice recommendations. A written personalized care plan for preventive services as well as general preventive health recommendations were provided to patient.     Shela Nevin,  [place]  09/05/2017  Kathlene November

## 2017-09-01 ENCOUNTER — Telehealth: Payer: Self-pay | Admitting: Internal Medicine

## 2017-09-01 NOTE — Telephone Encounter (Signed)
AWV scheduled too early, last AWV 09/07/16. Spoke with pt in regards to appt. Per pt does not want to see health coach nurse for wellness. Pt wants to keep appt with Dr. Larose Kells. SF

## 2017-09-05 ENCOUNTER — Ambulatory Visit: Payer: Medicare Other | Admitting: *Deleted

## 2017-09-05 ENCOUNTER — Encounter: Payer: Self-pay | Admitting: Internal Medicine

## 2017-09-05 ENCOUNTER — Ambulatory Visit (INDEPENDENT_AMBULATORY_CARE_PROVIDER_SITE_OTHER): Payer: Medicare Other | Admitting: Internal Medicine

## 2017-09-05 VITALS — BP 136/76 | HR 66 | Ht 66.0 in | Wt 202.0 lb

## 2017-09-05 DIAGNOSIS — E039 Hypothyroidism, unspecified: Secondary | ICD-10-CM

## 2017-09-05 DIAGNOSIS — I10 Essential (primary) hypertension: Secondary | ICD-10-CM | POA: Diagnosis not present

## 2017-09-05 DIAGNOSIS — I712 Thoracic aortic aneurysm, without rupture, unspecified: Secondary | ICD-10-CM

## 2017-09-05 DIAGNOSIS — E785 Hyperlipidemia, unspecified: Secondary | ICD-10-CM | POA: Diagnosis not present

## 2017-09-05 DIAGNOSIS — Z0000 Encounter for general adult medical examination without abnormal findings: Secondary | ICD-10-CM | POA: Diagnosis not present

## 2017-09-05 DIAGNOSIS — I7121 Aneurysm of the ascending aorta, without rupture: Secondary | ICD-10-CM

## 2017-09-05 NOTE — Patient Instructions (Addendum)
GO TO THE LAB : Get the blood work     GO TO THE FRONT DESK Schedule your next appointment for a  Routine check up in 6 months       Dustin Wall , Thank you for taking time to come for your Medicare Wellness Visit. I appreciate your ongoing commitment to your health goals. Please review the following plan we discussed and let me know if I can assist you in the future.   These are the goals we discussed: Goals      Patient Stated   . Take more vacations (pt-stated)       This is a list of the screening recommended for you and due dates:  Health Maintenance  Topic Date Due  . Tetanus Vaccine  09/06/2017*  . Pneumonia vaccines (1 of 2 - PCV13) 09/06/2017*  . Flu Shot  09/25/2017*  . Colon Cancer Screening  12/26/2020  .  Hepatitis C: One time screening is recommended by Center for Disease Control  (CDC) for  adults born from [birth year] through 1965.   Completed  *Topic was postponed. The date shown is not the original due date.   Continue to eat heart healthy diet (full of fruits, vegetables, whole grains, lean protein, water--limit salt, fat, and sugar intake) and increase physical activity as tolerated.  Continue doing brain stimulating activities (puzzles, reading, adult coloring books, staying active) to keep memory sharp.   Bring a copy of your living will and/or healthcare power of attorney to your next office visit.   Health Maintenance, Male A healthy lifestyle and preventive care is important for your health and wellness. Ask your health care provider about what schedule of regular examinations is right for you. What should I know about weight and diet? Eat a Healthy Diet  Eat plenty of vegetables, fruits, whole grains, low-fat dairy products, and lean protein.  Do not eat a lot of foods high in solid fats, added sugars, or salt.  Maintain a Healthy Weight Regular exercise can help you achieve or maintain a healthy weight. You should:  Do at least 150 minutes of exercise  each week. The exercise should increase your heart rate and make you sweat (moderate-intensity exercise).  Do strength-training exercises at least twice a week.  Watch Your Levels of Cholesterol and Blood Lipids  Have your blood tested for lipids and cholesterol every 5 years starting at 67 years of age. If you are at high risk for heart disease, you should start having your blood tested when you are 67 years old. You may need to have your cholesterol levels checked more often if: ? Your lipid or cholesterol levels are high. ? You are older than 67 years of age. ? You are at high risk for heart disease.  What should I know about cancer screening? Many types of cancers can be detected early and may often be prevented. Lung Cancer  You should be screened every year for lung cancer if: ? You are a current smoker who has smoked for at least 30 years. ? You are a former smoker who has quit within the past 15 years.  Talk to your health care provider about your screening options, when you should start screening, and how often you should be screened.  Colorectal Cancer  Routine colorectal cancer screening usually begins at 67 years of age and should be repeated every 5-10 years until you are 67 years old. You may need to be screened more often if early forms  of precancerous polyps or small growths are found. Your health care provider may recommend screening at an earlier age if you have risk factors for colon cancer.  Your health care provider may recommend using home test kits to check for hidden blood in the stool.  A small camera at the end of a tube can be used to examine your colon (sigmoidoscopy or colonoscopy). This checks for the earliest forms of colorectal cancer.  Prostate and Testicular Cancer  Depending on your age and overall health, your health care provider may do certain tests to screen for prostate and testicular cancer.  Talk to your health care provider about any  symptoms or concerns you have about testicular or prostate cancer.  Skin Cancer  Check your skin from head to toe regularly.  Tell your health care provider about any new moles or changes in moles, especially if: ? There is a change in a mole's size, shape, or color. ? You have a mole that is larger than a pencil eraser.  Always use sunscreen. Apply sunscreen liberally and repeat throughout the day.  Protect yourself by wearing long sleeves, pants, a wide-brimmed hat, and sunglasses when outside.  What should I know about heart disease, diabetes, and high blood pressure?  If you are 82-84 years of age, have your blood pressure checked every 3-5 years. If you are 40 years of age or older, have your blood pressure checked every year. You should have your blood pressure measured twice-once when you are at a hospital or clinic, and once when you are not at a hospital or clinic. Record the average of the two measurements. To check your blood pressure when you are not at a hospital or clinic, you can use: ? An automated blood pressure machine at a pharmacy. ? A home blood pressure monitor.  Talk to your health care provider about your target blood pressure.  If you are between 41-68 years old, ask your health care provider if you should take aspirin to prevent heart disease.  Have regular diabetes screenings by checking your fasting blood sugar level. ? If you are at a normal weight and have a low risk for diabetes, have this test once every three years after the age of 38. ? If you are overweight and have a high risk for diabetes, consider being tested at a younger age or more often.  A one-time screening for abdominal aortic aneurysm (AAA) by ultrasound is recommended for men aged 35-75 years who are current or former smokers. What should I know about preventing infection? Hepatitis B If you have a higher risk for hepatitis B, you should be screened for this virus. Talk with your health  care provider to find out if you are at risk for hepatitis B infection. Hepatitis C Blood testing is recommended for:  Everyone born from [birth year] through 1966.  Anyone with known risk factors for hepatitis C.  Sexually Transmitted Diseases (STDs)  You should be screened each year for STDs including gonorrhea and chlamydia if: ? You are sexually active and are younger than 68 years of age. ? You are older than 67 years of age and your health care provider tells you that you are at risk for this type of infection. ? Your sexual activity has changed since you were last screened and you are at an increased risk for chlamydia or gonorrhea. Ask your health care provider if you are at risk.  Talk with your health care provider about whether you are  at high risk of being infected with HIV. Your health care provider may recommend a prescription medicine to help prevent HIV infection.  What else can I do?  Schedule regular health, dental, and eye exams.  Stay current with your vaccines (immunizations).  Do not use any tobacco products, such as cigarettes, chewing tobacco, and e-cigarettes. If you need help quitting, ask your health care provider.  Limit alcohol intake to no more than 2 drinks per day. One drink equals 12 ounces of beer, 5 ounces of wine, or 1 ounces of hard liquor.  Do not use street drugs.  Do not share needles.  Ask your health care provider for help if you need support or information about quitting drugs.  Tell your health care provider if you often feel depressed.  Tell your health care provider if you have ever been abused or do not feel safe at home. This information is not intended to replace advice given to you by your health care provider. Make sure you discuss any questions you have with your health care provider. Document Released: 06/09/2008 Document Revised: 08/10/2016 Document Reviewed: 09/15/2015 Elsevier Interactive Patient Education  [employer].

## 2017-09-05 NOTE — Assessment & Plan Note (Signed)
-  Td ; pnm shot, zostavax, flu shot--- declined, benefits discussed -CCS: Normal colonoscopy December 2012 (had external hemorrhoids) -Prostate cancer screening:  +FH , DRE  PSA wnl 08-2016 - Diet discussed. He remains very active.Marland Kitchen

## 2017-09-05 NOTE — Progress Notes (Signed)
Subjective:    Patient ID: Dustin Wall, male    DOB: 18-Nov-1951, 66 y.o.   MRN: [id number]  DOS:  09/05/2017 Type of visit - description : ROV Interval history: Hypothyroidism: Good compliance of medication High cholesterol: Was prescribed, took and tolerated Lipitor but he ran out and did not ask for a refill. Denies any side effects Wife has noted episodes of apnea at bedtime, this does not happen every night, there is no associated fatigue or snoring. Somewhat concerned about OSA.   Review of Systems No chest pain or difficulty breathing No nausea, vomiting, diarrhea. No claudication No anxiety or depression   Past Medical History:  Diagnosis Date  . Ascending aortic aneurysm ([place])    dx 2010 ~ 4.4 cm  . Hyperlipidemia   . Hypertension   . Hypothyroidism    s/p hyperthyroidism, s/p radioiodine    Past Surgical History:  Procedure Laterality Date  . NO PAST SURGERIES      Social History   Social History  . Marital status: Married    Spouse name: N/A  . Number of children: 0  . Years of education: N/A   Occupational History  . fully retired at age 5, sanitation    Social History Main Topics  . Smoking status: Never Smoker  . Smokeless tobacco: Never Used  . Alcohol use Yes     Comment: beer and wine occasional  . Drug use: No  . Sexual activity: Yes   Other Topics Concern  . Not on file   Social History Narrative   Lives w/ wife      Allergies as of 09/05/2017   No Known Allergies     Medication List       Accurate as of 09/05/17 11:59 PM. Always use your most recent med list.          levothyroxine 125 MCG tablet Commonly known as:  SYNTHROID, LEVOTHROID Take 1 tablet (125 mcg total) by mouth daily before breakfast.   metoprolol succinate 100 MG 24 hr tablet Commonly known as:  TOPROL-XL Take 1 tablet (100 mg total) by mouth daily. Take with or immediately following a meal.   triamcinolone 0.025 % ointment Commonly known as:   KENALOG Apply 1 application topically daily as needed.            Discharge Care Instructions        Start     Ordered   09/05/17 0000  Comp Met (CMET)     09/05/17 1522   09/05/17 0000  Lipid panel     09/05/17 1522   09/05/17 0000  CBC w/Diff     09/05/17 1522   09/05/17 0000  TSH     09/05/17 1522   09/05/17 0000  MR MRA CHEST W WO CONTRAST    Question Answer Comment  If indicated for the ordered procedure, I authorize the administration of contrast media per Radiology protocol Yes   What is the patient's sedation requirement? No Sedation   Does the patient have a pacemaker or implanted devices? No   Radiology Contrast Protocol - do NOT remove file path [email or web address]   Preferred imaging location? [facility] (table limit-350 lbs)      09/05/17 1535   09/05/17 0000  Ambulatory referral to Cardiothoracic Surgery     09/05/17 1537         Objective:   Physical Exam BP 136/76 (BP Location: Right Arm, Patient Position: Sitting, Cuff Size: Normal)   Pulse  66   Ht _0  (1.676 m)   Wt 202 lb (91.6 kg)   SpO2 98%   BMI 32.60 kg/m  General:   Well developed, well nourished . NAD.  HEENT:  Normocephalic . Face symmetric, atraumatic Neck: No thyromegaly Lungs:  CTA B Normal respiratory effort, no intercostal retractions, no accessory muscle use. Heart: RRR,  no murmur.  no pretibial edema bilaterally  Abdomen:  Not distended, soft, non-tender. No rebound or rigidity.  Skin: Not pale. Not jaundice Neurologic:  alert & oriented X3.  Speech normal, gait appropriate for age and unassisted Psych--  Cognition and judgment appear intact.  Cooperative with normal attention span and concentration.  Behavior appropriate. No anxious or depressed appearing.    Assessment & Plan:   Assessment HTN Hyperlipidemia Hypothyroidism (hypothyroidism  > S/P  radioiodine) Ascending aortic aneurysm DX 2010, 4.5 cm. MRA 08-2015: 4.5 cm;  Dr. Roxan Hockey Vitiligo Acanthosis Nigricans   PLAN: HTN: On metoprolol, BPs satisfactory, check a CMP and CBC Hypothyroidism: On Synthroid, check a TSH Hyperlipidemia: Took Lipitor, good results, no side effects, he ran out and did not ask for a refill. Plan: Check a FLP, restart Lipitor if needed. Patient aware that this will be a long-term medication Ascending aortic aneurysm: Asx, due to see Dr. Roxan Hockey. Will place a referral. Will also set up MRI/MRA of the chest. Knows to go to the ER immediately if he has any chest pain.  OSA?Marland Kitchen Has occasional episodes of apnea but no other sx, see HPI; rec observation for now RTC 6 months

## 2017-09-06 ENCOUNTER — Telehealth: Payer: Self-pay | Admitting: Internal Medicine

## 2017-09-06 LAB — CBC WITH DIFFERENTIAL/PLATELET
Basophils Absolute: 0.1 10*3/uL (ref 0.0–0.1)
Basophils Relative: 1.5 % (ref 0.0–3.0)
EOS PCT: 1.7 % (ref 0.0–5.0)
Eosinophils Absolute: 0.1 10*3/uL (ref 0.0–0.7)
HCT: 45.8 % (ref 39.0–52.0)
Hemoglobin: 14.4 g/dL (ref 13.0–17.0)
LYMPHS ABS: 2 10*3/uL (ref 0.7–4.0)
Lymphocytes Relative: 48.1 % — ABNORMAL HIGH (ref 12.0–46.0)
MCHC: 31.5 g/dL (ref 30.0–36.0)
MCV: 87.4 fl (ref 78.0–100.0)
MONOS PCT: 9.5 % (ref 3.0–12.0)
Monocytes Absolute: 0.4 10*3/uL (ref 0.1–1.0)
NEUTROS ABS: 1.7 10*3/uL (ref 1.4–7.7)
NEUTROS PCT: 39.2 % — AB (ref 43.0–77.0)
Platelets: 193 10*3/uL (ref 150.0–400.0)
RBC: 5.24 Mil/uL (ref 4.22–5.81)
RDW: 15.6 % — AB (ref 11.5–15.5)
WBC: 4.2 10*3/uL (ref 4.0–10.5)

## 2017-09-06 LAB — COMPREHENSIVE METABOLIC PANEL
ALK PHOS: 49 U/L (ref 39–117)
ALT: 21 U/L (ref 0–53)
AST: 23 U/L (ref 0–37)
Albumin: 4.3 g/dL (ref 3.5–5.2)
BILIRUBIN TOTAL: 0.7 mg/dL (ref 0.2–1.2)
BUN: 12 mg/dL (ref 6–23)
CALCIUM: 9.9 mg/dL (ref 8.4–10.5)
CO2: 28 meq/L (ref 19–32)
Chloride: 99 mEq/L (ref 96–112)
Creatinine, Ser: 0.91 mg/dL (ref 0.40–1.50)
GFR: 106.84 mL/min (ref >60.00)
Glucose, Bld: 81 mg/dL (ref 70–99)
Potassium: 3.9 mEq/L (ref 3.5–5.1)
Sodium: 136 mEq/L (ref 135–145)
Total Protein: 7.5 g/dL (ref 6.0–8.3)

## 2017-09-06 LAB — LIPID PANEL
CHOL/HDL RATIO: 4
Cholesterol: 225 mg/dL — ABNORMAL HIGH (ref 0–200)
HDL: 57 mg/dL (ref >39.00)
LDL Cholesterol: 142 mg/dL — ABNORMAL HIGH (ref 0–99)
NonHDL: 168.08
TRIGLYCERIDES: 128 mg/dL (ref 0.0–149.0)
VLDL: 25.6 mg/dL (ref 0.0–40.0)

## 2017-09-06 LAB — TSH: TSH: 2.43 u[IU]/mL (ref 0.35–4.50)

## 2017-09-06 MED ORDER — METOPROLOL SUCCINATE ER 100 MG PO TB24: 100.0000 mg | ORAL_TABLET | Freq: Every day | ORAL | 3 refills | Status: DC

## 2017-09-06 NOTE — Assessment & Plan Note (Signed)
HTN: On metoprolol, BPs satisfactory, check a CMP and CBC Hypothyroidism: On Synthroid, check a TSH Hyperlipidemia: Took Lipitor, good results, no side effects, he ran out and did not ask for a refill. Plan: Check a FLP, restart Lipitor if needed. Patient aware that this will be a long-term medication Ascending aortic aneurysm: Asx, due to see Dr. Roxan Hockey. Will place a referral. Will also set up MRI/MRA of the chest. Knows to go to the ER immediately if he has any chest pain.  OSA?Marland Kitchen Has occasional episodes of apnea but no other sx, see HPI; rec observation for now RTC 6 months

## 2017-09-06 NOTE — Telephone Encounter (Signed)
Rx sent 

## 2017-09-06 NOTE — Telephone Encounter (Signed)
Caller name: Kaysin  Relation to pt: self  Call back number: [phone] Pharmacy: [facility] [place]  Reason for call: Pt came in office requesting refill on metoprolol succinate (TOPROL-XL) 100 MG 24 hr tablet, 90 day supply. Pt stated only has very little pills left. Please advise ASAP.

## 2017-09-08 MED ORDER — ATORVASTATIN CALCIUM 40 MG PO TABS: 40.0000 mg | ORAL_TABLET | Freq: Every day | ORAL | 1 refills | Status: DC

## 2017-09-08 NOTE — Addendum Note (Signed)
Addended byDamita Dunnings D on: 09/08/2017 10:53 AM   Modules accepted: Orders

## 2017-09-14 ENCOUNTER — Telehealth: Payer: Self-pay | Admitting: Internal Medicine

## 2017-09-14 NOTE — Telephone Encounter (Signed)
Caller name: Evertte  Relation to pt: self  Call back number: [phone] Pharmacy:  Reason for call: Pt states that nurse Vilma Prader) informed him if he does not receive a call for his MRI to be schedule, to come to the office. Pt states has not received call and would like to have some to call for this appt.

## 2017-09-14 NOTE — Telephone Encounter (Signed)
Patient is scheduled for 09/21/17, they have spoke with patient

## 2017-09-21 ENCOUNTER — Ambulatory Visit (HOSPITAL_COMMUNITY)
Admission: RE | Admit: 2017-09-21 | Discharge: 2017-09-21 | Disposition: A | Discharge status: Home / Self Care | Payer: Medicare Other | Source: Ambulatory Visit | Attending: Internal Medicine | Admitting: Internal Medicine

## 2017-09-21 DIAGNOSIS — I712 Thoracic aortic aneurysm, without rupture: Secondary | ICD-10-CM | POA: Diagnosis not present

## 2017-09-21 MED ORDER — GADOBENATE DIMEGLUMINE 529 MG/ML IV SOLN
20.0000 mL | Freq: Once | INTRAVENOUS | Status: AC | PRN
  Administered 2017-09-21: 19 mL via INTRAVENOUS

## 2017-09-21 MED ORDER — SODIUM CHLORIDE 0.9 % IV SOLN
8.0000 mg | Freq: Once | INTRAVENOUS | Status: AC
  Administered 2017-09-21: 8 mg via INTRAVENOUS
  Filled 2017-09-21: qty 4

## 2017-10-03 ENCOUNTER — Ambulatory Visit (INDEPENDENT_AMBULATORY_CARE_PROVIDER_SITE_OTHER): Payer: Medicare Other | Admitting: Thoracic Surgery (Cardiothoracic Vascular Surgery)

## 2017-10-03 ENCOUNTER — Encounter: Payer: Self-pay | Admitting: Thoracic Surgery (Cardiothoracic Vascular Surgery)

## 2017-10-03 VITALS — BP 134/94 | HR 85 | Ht 66.0 in | Wt 202.0 lb

## 2017-10-03 DIAGNOSIS — I712 Thoracic aortic aneurysm, without rupture: Secondary | ICD-10-CM

## 2017-10-03 DIAGNOSIS — I10 Essential (primary) hypertension: Secondary | ICD-10-CM

## 2017-10-03 DIAGNOSIS — I7121 Aneurysm of the ascending aorta, without rupture: Secondary | ICD-10-CM

## 2017-10-03 NOTE — Progress Notes (Signed)
[address]       [place] [address]             [phone]    HPI: [name] returns for follow-up regarding his ascending aneurysm.   [name] is a 66 year old man with a past medical history significant for hypertension hyperlipidemia, hypothyroidism, and an ascending aortic aneurysm (4.5 cm).  He had a CT of the chest in 2011 which showed a 4.47 m ascending aneurysm. He moved to [place] in 2015. A CT of the chest then showed a 4.5 cm ascending aneurysm. I last saw him in 2016 and the aneurysm was unchanged.  He has been feeling well. He denies any chest pain, pressure, or tightness. He denies shortness of breath and peripheral edema. He denies vertigo or syncope. He checks his blood pressure at home on a regular basis. He says he usually runs around 379 systolic.  Past Medical History:  Diagnosis Date  . Ascending aortic aneurysm ([place])    dx 2010 ~ 4.4 cm  . Hyperlipidemia   . Hypertension   . Hypothyroidism    s/p hyperthyroidism, s/p radioiodine    Current Outpatient Prescriptions  Medication Sig Dispense Refill  . atorvastatin (LIPITOR) 40 MG tablet Take 1 tablet (40 mg total) by mouth at bedtime. 90 tablet 1  . levothyroxine (SYNTHROID, LEVOTHROID) 125 MCG tablet Take 1 tablet (125 mcg total) by mouth daily before breakfast. 30 tablet 3  . metoprolol succinate (TOPROL-XL) 100 MG 24 hr tablet Take 1 tablet (100 mg total) by mouth daily. Take with or immediately following a meal. 90 tablet 3  . triamcinolone (KENALOG) 0.025 % ointment Apply 1 application topically daily as needed. 80 g 2   No current facility-administered medications for this visit.     Physical Exam BP (!) 134/94   Pulse 85   Ht 5\' 6"  (1.676 m)   Wt 202 lb (91.6 kg)   SpO2 95%   BMI 32.51 kg/m  66 year old man in no acute distress Alert and oriented 3 with no focal deficits No carotid bruits Lungs clear with equal breath sounds bilaterally Cardiac regular rate  and rhythm normal S1 and S2 no rubs murmurs or gallops No peripheral edema  Diagnostic Tests: MRA CHEST WITH OR WITHOUT CONTRAST  TECHNIQUE: Angiographic images of the chest were obtained using MRA technique with intravenous contrast.  CONTRAST:  58mL MULTIHANCE GADOBENATE DIMEGLUMINE 529 MG/ML IV SOLN Pt given 8 mg IV zofran prior to MRi per Dr Kathlene Cote due to pt having n/v prev MRI IV SOLN  COMPARISON:  09/08/2015 and previous  FINDINGS: VASCULAR  Aorta: Thoracic aortic transverse diameter measurements as follows:  3.9 cm sinuses of Valsalva  3.4 cm sino-tubular junction  4.6 cm mid ascending (previously 4.5)  3.7 cm distal ascending/ proximal arch  3.3 cm distal arch  2.6 cm proximal descending  2.4 cm distal descending  No evidence of dissection or stenosis. Bovine variant brachiocephalic arterial origin anatomy without proximal stenosis. No significant atheromatous irregularity. Visualized proximal abdominal aorta unremarkable.  Heart: Normal in caliber.  No pericardial effusion.  Pulmonary Arteries: Unremarkable centrally, limited peripheral evaluation due to motion degradation.  Other: No pleural effusion.  No adenopathy localized.  NON-VASCULAR  Spinal cord: Negative, limited evaluation  Brachial plexus: No acute findings  Muscles and tendons: No acute findings  Bones: No worrisome lesion.  Joints: Negative, limited evaluation  IMPRESSION: VASCULAR  1. 4.6 cm mid ascending aortic aneurysm without complicating  features (previously 4.5 cm).  NON-VASCULAR  1. No acute findings.   Electronically Signed   By: Lucrezia Europe M.D.   On: 09/21/2017 15:16 I personally reviewed the MR angiogram and compared it to his previous studies. I concur with findings noted above. There is been no appreciable increase in size  Impression: Mr.Dustin Wall is a 66 year old man with a 4.5 cm ascending aneurysm. This has been stable for  about 7 years now. I discussed that the guidelines now recommend scanning every 6 months once an aneurysm reaches 4.5 cm. He is agreeable to that.  Hypertension- I again emphasized the importance of blood pressure control. His blood pressure was slightly elevated at 134/94 today. I recommended rechecking on a regular basis and we would like to keep his systolic less than 009 and his diastolic less than 90.   Plan:  Return in 6 months with MR angiogram   Melrose Nakayama, MD [facility] [phone]

## 2017-11-23 ENCOUNTER — Other Ambulatory Visit: Payer: Self-pay

## 2017-11-23 MED ORDER — LEVOTHYROXINE SODIUM 125 MCG PO TABS: 125.0000 ug | ORAL_TABLET | Freq: Every day | ORAL | 1 refills | Status: DC

## 2017-12-27 ENCOUNTER — Ambulatory Visit (INDEPENDENT_AMBULATORY_CARE_PROVIDER_SITE_OTHER): Payer: Medicare HMO | Admitting: Internal Medicine

## 2017-12-27 ENCOUNTER — Encounter: Payer: Self-pay | Admitting: Internal Medicine

## 2017-12-27 VITALS — BP 138/70 | HR 76 | Temp 97.6°F | Resp 14 | Ht 66.0 in | Wt 208.5 lb

## 2017-12-27 DIAGNOSIS — E785 Hyperlipidemia, unspecified: Secondary | ICD-10-CM

## 2017-12-27 DIAGNOSIS — K1120 Sialoadenitis, unspecified: Secondary | ICD-10-CM | POA: Diagnosis not present

## 2017-12-27 LAB — LIPID PANEL
CHOL/HDL RATIO: 4
Cholesterol: 163 mg/dL (ref 0–200)
HDL: 42.3 mg/dL (ref >39.00)
LDL Cholesterol: 92 mg/dL (ref 0–99)
NONHDL: 120.78
Triglycerides: 143 mg/dL (ref 0.0–149.0)
VLDL: 28.6 mg/dL (ref 0.0–40.0)

## 2017-12-27 LAB — AST: AST: 20 U/L (ref 0–37)

## 2017-12-27 LAB — ALT: ALT: 25 U/L (ref 0–53)

## 2017-12-27 MED ORDER — LEVOTHYROXINE SODIUM 125 MCG PO TABS: 125.0000 ug | ORAL_TABLET | Freq: Every day | ORAL | 1 refills | Status: DC

## 2017-12-27 MED ORDER — ATORVASTATIN CALCIUM 40 MG PO TABS: 40.0000 mg | ORAL_TABLET | Freq: Every day | ORAL | 1 refills | Status: DC

## 2017-12-27 MED ORDER — METOPROLOL SUCCINATE ER 100 MG PO TB24: 100.0000 mg | ORAL_TABLET | Freq: Every day | ORAL | 3 refills | Status: DC

## 2017-12-27 MED ORDER — AMOXICILLIN-POT CLAVULANATE 875-125 MG PO TABS: 1.0000 | ORAL_TABLET | Freq: Two times a day (BID) | ORAL | 0 refills | Status: DC

## 2017-12-27 NOTE — Progress Notes (Signed)
Pre visit review using our clinic review tool, if applicable. No additional management support is needed unless otherwise documented below in the visit note. 

## 2017-12-27 NOTE — Patient Instructions (Signed)
GO TO THE LAB : Get the blood work     GO TO THE FRONT DESK Schedule your next appointment for a   checkup in 3 weeks  Warm compress and massage the gland twice a day  Suck on a lime or lemon 3 times a day  Take the antibiotics as prescribed for one weeks  Call if not gradually better or if the area gets worse.  Call if fever chills.    Parotitis Parotitis means that you have irritation and swelling (inflammation) in one or both of your parotid glands. These glands make spit (saliva). They are found on each side of your face, below and in front of your earlobes. You may or may not have pain with this condition. Follow these instructions at home: Medicines  Take over-the-counter and prescription medicines only as told by your doctor.  If you were prescribed an antibiotic medicine, take it as told by your doctor. Do not stop taking the antibiotic even if you start to feel better. Managing pain and swelling  Apply warm cloths (compresses) to the swollen area as told by your doctor.  Gently rub your parotid glands as told by your doctor. General instructions   Drink enough fluid to keep your pee (urine) clear or pale yellow.  Suck on sour candy. This may help: ? To make your mouth less dry. ? To make more spit.  Keep your mouth clean and moist. ? Gargle with a salt-water mixture 3-4 times per day, or as needed. ? To make a salt-water mixture, stir -1 tsp of salt into 1 cup of warm water.  Take good care of your mouth: ? Brush your teeth at least two times per day. ? Floss your teeth every day. ? See your dentist regularly.  Do not use tobacco products. These include cigarettes, chewing tobacco, or e-cigarettes. If you need help quitting,  ask your doctor.  Keep all follow-up visits as told by your doctor. This is important. Contact a doctor if:  You have a fever or chills.  You have new symptoms.  Your symptoms get worse.  Your symptoms do not get better with  treatment. This information is not intended to replace advice given to you by your health care provider. Make sure you discuss any questions you have with your health care provider. Document Released: 01/14/2011 Document Revised: 05/19/2016 Document Reviewed: 05/07/2015 Elsevier Interactive Patient Education  [employer].

## 2017-12-27 NOTE — Progress Notes (Signed)
Subjective:    Patient ID: Dustin Wall, male    DOB: 11-17-1951, 67 y.o.   MRN: [id number]  DOS:  12/27/2017 Type of visit - description : acute, here with his wife Interval history: Developed lump at the left neck, a week ago, area is  TTP, associated with some sore throat.  Also left ear sharp pain on and off. When I did the physical exam today and massage the area he did notice a bad flavor in the mouth.  Cholesterol: Restarted Lipitor, good compliance and tolerance. He is fasting today.   Review of Systems  Denies any fever, chills.  He has occasional runny nose and a cough but those are at baseline. No unusual aches or pains  Past Medical History:  Diagnosis Date  . Ascending aortic aneurysm ([place])    dx 2010 ~ 4.4 cm  . Hyperlipidemia   . Hypertension   . Hypothyroidism    s/p hyperthyroidism, s/p radioiodine    Past Surgical History:  Procedure Laterality Date  . NO PAST SURGERIES      Social History   Socioeconomic History  . Marital status: Married    Spouse name: Not on file  . Number of children: 0  . Years of education: Not on file  . Highest education level: Not on file  Social Needs  . Financial resource strain: Not on file  . Food insecurity - worry: Not on file  . Food insecurity - inability: Not on file  . Transportation needs - medical: Not on file  . Transportation needs - non-medical: Not on file  Occupational History  . Occupation: fully retired at age 23, sanitation  Tobacco Use  . Smoking status: Never Smoker  . Smokeless tobacco: Never Used  Substance and Sexual Activity  . Alcohol use: Yes    Comment: beer and wine occasional  . Drug use: No  . Sexual activity: Yes  Other Topics Concern  . Not on file  Social History Narrative   Lives w/ wife      Allergies as of 12/27/2017      Reactions   Gadolinium Derivatives Nausea And Vomiting   Pt was given IV Zofran per Dr Kathlene Cote prior to imaging for n/v   Pt states prev n/v from gad  at GI from MRA      Medication List        Accurate as of 12/27/17 10:58 AM. Always use your most recent med list.          atorvastatin 40 MG tablet Commonly known as:  LIPITOR Take 1 tablet (40 mg total) by mouth at bedtime.   levothyroxine 125 MCG tablet Commonly known as:  SYNTHROID, LEVOTHROID Take 1 tablet (125 mcg total) by mouth daily before breakfast.   metoprolol succinate 100 MG 24 hr tablet Commonly known as:  TOPROL-XL Take 1 tablet (100 mg total) by mouth daily. Take with or immediately following a meal.   triamcinolone 0.025 % ointment Commonly known as:  KENALOG Apply 1 application topically daily as needed.          Objective:   Physical Exam  Neck:     BP 138/70 (BP Location: Left Arm, Patient Position: Sitting, Cuff Size: Small)   Pulse 76   Temp 97.6 F (36.4 C) (Oral)   Resp 14   Ht 5\' 6"  (1.676 m)   Wt 208 lb 8 oz (94.6 kg)   SpO2 98%   BMI 33.65 kg/m  General:   Well  developed, well nourished . NAD.  HEENT:  Normocephalic . Face symmetric, atraumatic Neck: See graphic Mouth: Gums without swelling, has missing teeth but dental pieces are nontender to percussion. Palpation of the floor of the mouth to confirm a L mass,firm,  slightly TTP, not fluctuant.  Mild redness at the base of the mouth, likely where a salivary gland exits.    Heart: RRR,  no murmur.  No pretibial edema bilaterally  Skin: Not pale. Not jaundice Neurologic:  alert & oriented X3.  Speech normal, gait appropriate for age and unassisted Psych--  Cognition and judgment appear intact.  Cooperative with normal attention span and concentration.  Behavior appropriate. No anxious or depressed appearing.      Assessment & Plan:   Assessment HTN Hyperlipidemia Hypothyroidism (hypothyroidism  > S/P  radioiodine) Ascending aortic aneurysm DX 2010, 4.5 cm. MRA 08-2017 stable; Dr. Roxan Hockey Vitiligo Acanthosis Nigricans   PLAN: Acute sialoadenitis  Suspect acute  inflammation of a mouth salivary gland, infectious versus stone.  Reported bad flavor in the mouth whenever I massage the area.  Rx Augmentin, warm compresses, massage, suck on lemons.  Call if not better, call if worse.  Reassess in 3 weeks High cholesterol: Based on last FLP, he restarted Lipitor, checking labs.  No apparent side effects. RTC 3 weeks

## 2017-12-28 NOTE — Assessment & Plan Note (Signed)
Acute sialoadenitis  Suspect acute inflammation of a mouth salivary gland, infectious versus stone.  Reported bad flavor in the mouth whenever I massage the area.  Rx Augmentin, warm compresses, massage, suck on lemons.  Call if not better, call if worse.  Reassess in 3 weeks High cholesterol: Based on last FLP, he restarted Lipitor, checking labs.  No apparent side effects. RTC 3 weeks

## 2018-01-17 ENCOUNTER — Encounter: Payer: Self-pay | Admitting: Internal Medicine

## 2018-01-17 ENCOUNTER — Ambulatory Visit (INDEPENDENT_AMBULATORY_CARE_PROVIDER_SITE_OTHER): Payer: Medicare HMO | Admitting: Internal Medicine

## 2018-01-17 VITALS — BP 116/78 | HR 76 | Temp 97.9°F | Resp 14 | Ht 66.0 in | Wt 204.2 lb

## 2018-01-17 DIAGNOSIS — K1120 Sialoadenitis, unspecified: Secondary | ICD-10-CM

## 2018-01-17 NOTE — Patient Instructions (Signed)
Call if you don't hear about the ENT referral in 5 days

## 2018-01-17 NOTE — Progress Notes (Signed)
Pre visit review using our clinic review tool, if applicable. No additional management support is needed unless otherwise documented below in the visit note. 

## 2018-01-17 NOTE — Assessment & Plan Note (Signed)
Acute sialoadenitis  Patient has been treated for acute sialoadenitis, on exam today he has persistent mass at the  left side of the neck.  Recommend ENT referral, will arrange. For now, no more antibiotics, continue trying the sour candy or sucking lemons.

## 2018-01-17 NOTE — Progress Notes (Signed)
Subjective:    Patient ID: Dustin Wall, male    DOB: 11/25/1951, 67 y.o.   MRN: [id number]  DOS:  01/17/2018 Type of visit - description : f/u Interval history: Was seen with sialoadenitis recently, s/p  antibiotics. On self palpation, the area has decreased in size. He is still massage the area and feels a very peculiar taste after that.   Review of Systems No fever chills  Past Medical History:  Diagnosis Date  . Ascending aortic aneurysm ([place])    dx 2010 ~ 4.4 cm  . Hyperlipidemia   . Hypertension   . Hypothyroidism    s/p hyperthyroidism, s/p radioiodine    Past Surgical History:  Procedure Laterality Date  . NO PAST SURGERIES      Social History   Socioeconomic History  . Marital status: Married    Spouse name: Not on file  . Number of children: 0  . Years of education: Not on file  . Highest education level: Not on file  Social Needs  . Financial resource strain: Not on file  . Food insecurity - worry: Not on file  . Food insecurity - inability: Not on file  . Transportation needs - medical: Not on file  . Transportation needs - non-medical: Not on file  Occupational History  . Occupation: fully retired at age 74, sanitation  Tobacco Use  . Smoking status: Never Smoker  . Smokeless tobacco: Never Used  Substance and Sexual Activity  . Alcohol use: Yes    Comment: beer and wine occasional  . Drug use: No  . Sexual activity: Yes  Other Topics Concern  . Not on file  Social History Narrative   Lives w/ wife      Allergies as of 01/17/2018      Reactions   Gadolinium Derivatives Nausea And Vomiting   Pt was given IV Zofran per Dr Kathlene Cote prior to imaging for n/v   Pt states prev n/v from gad at GI from MRA      Medication List        Accurate as of 01/17/18 10:00 AM. Always use your most recent med list.          amoxicillin-clavulanate 875-125 MG tablet Commonly known as:  AUGMENTIN Take 1 tablet by mouth 2 (two) times daily.     atorvastatin 40 MG tablet Commonly known as:  LIPITOR Take 1 tablet (40 mg total) by mouth at bedtime.   levothyroxine 125 MCG tablet Commonly known as:  SYNTHROID, LEVOTHROID Take 1 tablet (125 mcg total) by mouth daily before breakfast.   metoprolol succinate 100 MG 24 hr tablet Commonly known as:  TOPROL-XL Take 1 tablet (100 mg total) by mouth daily. Take with or immediately following a meal.   triamcinolone 0.025 % ointment Commonly known as:  KENALOG Apply 1 application topically daily as needed.          Objective:   Physical Exam  Neck:     BP 116/78 (BP Location: Left Arm, Patient Position: Sitting, Cuff Size: Normal)   Pulse 76   Temp 97.9 F (36.6 C) (Oral)   Resp 14   Ht 5\' 6"  (1.676 m)   Wt 204 lb 4 oz (92.6 kg)   SpO2 92%   BMI 32.97 kg/m  General:   Well developed, well nourished . NAD.  HEENT:  Normocephalic . Face symmetric, atraumatic See graphic Skin: Not pale. Not jaundice Neurologic:  alert & oriented X3.  Speech normal, gait appropriate for  age and unassisted Psych--  Cognition and judgment appear intact.  Cooperative with normal attention span and concentration.  Behavior appropriate. No anxious or depressed appearing.      Assessment & Plan:  Assessment HTN Hyperlipidemia Hypothyroidism (hypothyroidism  > S/P  radioiodine) Ascending aortic aneurysm DX 2010, 4.5 cm. MRA 08-2017 stable; Dr. Roxan Hockey Vitiligo Acanthosis Nigricans   PLAN: Acute sialoadenitis  Patient has been treated for acute sialoadenitis, on exam today he has persistent mass at the  left side of the neck.  Recommend ENT referral, will arrange. For now, no more antibiotics, continue trying the sour candy or sucking lemons.

## 2018-01-30 DIAGNOSIS — K1120 Sialoadenitis, unspecified: Secondary | ICD-10-CM | POA: Diagnosis not present

## 2018-03-06 ENCOUNTER — Ambulatory Visit (INDEPENDENT_AMBULATORY_CARE_PROVIDER_SITE_OTHER): Payer: Medicare HMO | Admitting: Internal Medicine

## 2018-03-06 ENCOUNTER — Encounter: Payer: Self-pay | Admitting: Internal Medicine

## 2018-03-06 VITALS — BP 128/68 | HR 69 | Temp 98.0°F | Resp 14 | Ht 66.0 in | Wt 203.2 lb

## 2018-03-06 DIAGNOSIS — E785 Hyperlipidemia, unspecified: Secondary | ICD-10-CM | POA: Diagnosis not present

## 2018-03-06 DIAGNOSIS — E039 Hypothyroidism, unspecified: Secondary | ICD-10-CM

## 2018-03-06 DIAGNOSIS — K1120 Sialoadenitis, unspecified: Secondary | ICD-10-CM

## 2018-03-06 LAB — TSH: TSH: 2.66 u[IU]/mL (ref 0.35–4.50)

## 2018-03-06 NOTE — Progress Notes (Signed)
Subjective:    Patient ID: Dustin Wall, male    DOB: January 13, 1951, 67 y.o.   MRN: [id number]  DOS:  03/06/2018 Type of visit - description : f/u Interval history: Since the last office visit, went to see ENT, dx was sialoadenitis, they agreed on observation and possibly further eval if problems. At this point patient reports he is doing much better, no pain, size of the gland has decreased High cholesterol: Good compliance with medication Hypothyroidism: Good compliance of medication  Review of Systems Denies chest pain, difficulty breathing no lower extremity edema  Past Medical History:  Diagnosis Date  . Ascending aortic aneurysm ([place])    dx 2010 ~ 4.4 cm  . Hyperlipidemia   . Hypertension   . Hypothyroidism    s/p hyperthyroidism, s/p radioiodine    Past Surgical History:  Procedure Laterality Date  . NO PAST SURGERIES      Social History   Socioeconomic History  . Marital status: Married    Spouse name: Not on file  . Number of children: 0  . Years of education: Not on file  . Highest education level: Not on file  Social Needs  . Financial resource strain: Not on file  . Food insecurity - worry: Not on file  . Food insecurity - inability: Not on file  . Transportation needs - medical: Not on file  . Transportation needs - non-medical: Not on file  Occupational History  . Occupation: fully retired at age 60, sanitation  Tobacco Use  . Smoking status: Never Smoker  . Smokeless tobacco: Never Used  Substance and Sexual Activity  . Alcohol use: Yes    Comment: beer and wine occasional  . Drug use: No  . Sexual activity: Yes  Other Topics Concern  . Not on file  Social History Narrative   Lives w/ wife      Allergies as of 03/06/2018      Reactions   Gadolinium Derivatives Nausea And Vomiting   Pt was given IV Zofran per Dr Kathlene Cote prior to imaging for n/v   Pt states prev n/v from gad at GI from MRA      Medication List        Accurate as of  03/06/18 10:18 AM. Always use your most recent med list.          atorvastatin 40 MG tablet Commonly known as:  LIPITOR Take 1 tablet (40 mg total) by mouth at bedtime.   levothyroxine 125 MCG tablet Commonly known as:  SYNTHROID, LEVOTHROID Take 1 tablet (125 mcg total) by mouth daily before breakfast.   metoprolol succinate 100 MG 24 hr tablet Commonly known as:  TOPROL-XL Take 1 tablet (100 mg total) by mouth daily. Take with or immediately following a meal.   triamcinolone 0.025 % ointment Commonly known as:  KENALOG Apply 1 application topically daily as needed.          Objective:   Physical Exam BP 128/68 (BP Location: Right Arm, Patient Position: Sitting, Cuff Size: Small)   Pulse 69   Temp 98 F (36.7 C) (Oral)   Resp 14   Ht 5\' 6"  (1.676 m)   Wt 203 lb 4 oz (92.2 kg)   SpO2 98%   BMI 32.81 kg/m  General:   Well developed, well nourished . NAD.  HEENT:  Normocephalic . Face symmetric, atraumatic.   Left submandibular gland has decreased in size, is now around 1 cm.  Not tender, mobile Lungs:  CTA  B Normal respiratory effort, no intercostal retractions, no accessory muscle use. Heart: RRR,  no murmur.  No pretibial edema bilaterally  Skin: Not pale. Not jaundice Neurologic:  alert & oriented X3.  Speech normal, gait appropriate for age and unassisted Psych--  Cognition and judgment appear intact.  Cooperative with normal attention span and concentration.  Behavior appropriate. No anxious or depressed appearing.      Assessment & Plan:   Assessment HTN Hyperlipidemia Hypothyroidism (hypothyroidism  > S/P  radioiodine) Ascending aortic aneurysm DX 2010, 4.5 cm. MRA 08-2017 stable; Dr. Roxan Hockey Vitiligo Acanthosis Nigricans   PLAN: Hypothyroidism: On Synthroid, check a TSH High cholesterol: Last LDL satisfactory, continue Lipitor sialoadenitis: Saw ENT, dx was sialoadenitis, further eval was discussed, elected  self-monitoring which is  improving; clinical examination showed that the L gland has decreased in size to 1 cm, still slightly larger compared to the R  but improving.  Continue self-monitoring. RTC 6 months CPX

## 2018-03-06 NOTE — Progress Notes (Signed)
Pre visit review using our clinic review tool, if applicable. No additional management support is needed unless otherwise documented below in the visit note. 

## 2018-03-06 NOTE — Patient Instructions (Signed)
GO TO THE LAB : Get the blood work     GO TO THE FRONT DESK Schedule your next appointment for a  Physical in 6 months, fasting

## 2018-03-07 NOTE — Assessment & Plan Note (Signed)
Hypothyroidism: On Synthroid, check a TSH High cholesterol: Last LDL satisfactory, continue Lipitor sialoadenitis: Saw ENT, dx was sialoadenitis, further eval was discussed, elected  self-monitoring which is improving; clinical examination showed that the L gland has decreased in size to 1 cm, still slightly larger compared to the R  but improving.  Continue self-monitoring. RTC 6 months CPX

## 2018-03-14 ENCOUNTER — Other Ambulatory Visit: Payer: Self-pay | Admitting: *Deleted

## 2018-03-14 DIAGNOSIS — I712 Thoracic aortic aneurysm, without rupture, unspecified: Secondary | ICD-10-CM

## 2018-03-26 ENCOUNTER — Other Ambulatory Visit: Payer: Self-pay | Admitting: *Deleted

## 2018-03-26 DIAGNOSIS — I712 Thoracic aortic aneurysm, without rupture, unspecified: Secondary | ICD-10-CM

## 2018-03-30 ENCOUNTER — Ambulatory Visit (HOSPITAL_COMMUNITY): Admission: RE | Admit: 2018-03-30 | Payer: Medicare HMO | Source: Ambulatory Visit

## 2018-04-03 ENCOUNTER — Ambulatory Visit: Payer: Medicare HMO | Admitting: Thoracic Surgery (Cardiothoracic Vascular Surgery)

## 2018-06-25 ENCOUNTER — Other Ambulatory Visit: Payer: Self-pay | Admitting: Internal Medicine

## 2018-07-17 DIAGNOSIS — E669 Obesity, unspecified: Secondary | ICD-10-CM | POA: Diagnosis not present

## 2018-07-17 DIAGNOSIS — I10 Essential (primary) hypertension: Secondary | ICD-10-CM | POA: Diagnosis not present

## 2018-07-17 DIAGNOSIS — R69 Illness, unspecified: Secondary | ICD-10-CM | POA: Diagnosis not present

## 2018-07-17 DIAGNOSIS — E785 Hyperlipidemia, unspecified: Secondary | ICD-10-CM | POA: Diagnosis not present

## 2018-07-17 DIAGNOSIS — Z8249 Family history of ischemic heart disease and other diseases of the circulatory system: Secondary | ICD-10-CM | POA: Diagnosis not present

## 2018-07-17 DIAGNOSIS — E039 Hypothyroidism, unspecified: Secondary | ICD-10-CM | POA: Diagnosis not present

## 2018-07-17 DIAGNOSIS — Z6832 Body mass index (BMI) 32.0-32.9, adult: Secondary | ICD-10-CM | POA: Diagnosis not present

## 2018-09-06 ENCOUNTER — Encounter: Payer: Self-pay | Admitting: Internal Medicine

## 2018-09-06 ENCOUNTER — Ambulatory Visit (INDEPENDENT_AMBULATORY_CARE_PROVIDER_SITE_OTHER): Payer: Medicare HMO | Admitting: Internal Medicine

## 2018-09-06 VITALS — BP 139/93 | HR 70 | Temp 98.4°F | Ht 66.0 in | Wt 203.6 lb

## 2018-09-06 DIAGNOSIS — Z0000 Encounter for general adult medical examination without abnormal findings: Secondary | ICD-10-CM | POA: Diagnosis not present

## 2018-09-06 LAB — CBC WITH DIFFERENTIAL/PLATELET
BASOS ABS: 0 10*3/uL (ref 0.0–0.1)
Basophils Relative: 0.4 % (ref 0.0–3.0)
EOS PCT: 1.6 % (ref 0.0–5.0)
Eosinophils Absolute: 0.1 10*3/uL (ref 0.0–0.7)
HEMATOCRIT: 43.9 % (ref 39.0–52.0)
HEMOGLOBIN: 14.2 g/dL (ref 13.0–17.0)
LYMPHS ABS: 1.6 10*3/uL (ref 0.7–4.0)
LYMPHS PCT: 43.7 % (ref 12.0–46.0)
MCHC: 32.4 g/dL (ref 30.0–36.0)
MCV: 85.3 fl (ref 78.0–100.0)
MONOS PCT: 8.9 % (ref 3.0–12.0)
Monocytes Absolute: 0.3 10*3/uL (ref 0.1–1.0)
Neutro Abs: 1.7 10*3/uL (ref 1.4–7.7)
Neutrophils Relative %: 45.4 % (ref 43.0–77.0)
Platelets: 180 10*3/uL (ref 150.0–400.0)
RBC: 5.15 Mil/uL (ref 4.22–5.81)
RDW: 15.1 % (ref 11.5–15.5)
WBC: 3.7 10*3/uL — AB (ref 4.0–10.5)

## 2018-09-06 LAB — LIPID PANEL
CHOLESTEROL: 149 mg/dL (ref 0–200)
HDL: 42.1 mg/dL (ref >39.00)
LDL Cholesterol: 89 mg/dL (ref 0–99)
NONHDL: 107.38
TRIGLYCERIDES: 93 mg/dL (ref 0.0–149.0)
Total CHOL/HDL Ratio: 4
VLDL: 18.6 mg/dL (ref 0.0–40.0)

## 2018-09-06 LAB — COMPREHENSIVE METABOLIC PANEL
ALK PHOS: 53 U/L (ref 39–117)
ALT: 50 U/L (ref 0–53)
AST: 33 U/L (ref 0–37)
Albumin: 4.1 g/dL (ref 3.5–5.2)
BILIRUBIN TOTAL: 0.8 mg/dL (ref 0.2–1.2)
BUN: 11 mg/dL (ref 6–23)
CHLORIDE: 101 meq/L (ref 96–112)
CO2: 29 mEq/L (ref 19–32)
Calcium: 9.2 mg/dL (ref 8.4–10.5)
Creatinine, Ser: 0.88 mg/dL (ref 0.40–1.50)
GFR: 110.72 mL/min (ref >60.00)
GLUCOSE: 96 mg/dL (ref 70–99)
Potassium: 3.9 mEq/L (ref 3.5–5.1)
Sodium: 137 mEq/L (ref 135–145)
Total Protein: 7.1 g/dL (ref 6.0–8.3)

## 2018-09-06 LAB — TSH: TSH: 1.73 u[IU]/mL (ref 0.35–4.50)

## 2018-09-06 LAB — PSA: PSA: 1.71 ng/mL (ref 0.10–4.00)

## 2018-09-06 NOTE — Assessment & Plan Note (Signed)
-  Td ; pnm shot, zostavax, flu shot--- declined all immunizations again this year. -CCS: Normal colonoscopy December 2012 (had external hemorrhoids) -Prostate cancer screening:  +FH , DRE wnl today, check a PSA -Lifestyle: Goes to the gym 3 times a week, trying to eat healthy. -Labs: CMP, FLP, CBC, TSH, PSA

## 2018-09-06 NOTE — Patient Instructions (Signed)
GO TO THE LAB : Get the blood work     [facility]  --Schedule your next appointment for a nurse visit in 3 weeks for a blood pressure check --Schedule next appointment with me in 6 months  Check the  blood pressure 2 or 3 times a week, write them down Be sure your blood pressure is between 110/65 and  135/85. If it is consistently higher or lower, let me know

## 2018-09-06 NOTE — Progress Notes (Signed)
Subjective:    Patient ID: Dustin Wall, male    DOB: 06-09-1951, 67 y.o.   MRN: [id number]  DOS:  09/06/2018 Type of visit - description : cpx Interval history: No major concerns   BP Readings from Last 3 Encounters:  09/06/18 (!) 139/93  03/06/18 128/68  01/17/18 116/78     Review of Systems Has a long history of varicose veins, from time to time they "popped up" and hurt  a little.  Other than above, a 14 point review of systems is negative    Past Medical History:  Diagnosis Date  . Ascending aortic aneurysm ([place])    dx 2010 ~ 4.4 cm  . Hyperlipidemia   . Hypertension   . Hypothyroidism    s/p hyperthyroidism, s/p radioiodine    Past Surgical History:  Procedure Laterality Date  . NO PAST SURGERIES      Social History   Socioeconomic History  . Marital status: Married    Spouse name: Not on file  . Number of children: 0  . Years of education: Not on file  . Highest education level: Not on file  Occupational History  . Occupation: fully retired at age 11, sanitation  Social Needs  . Financial resource strain: Not on file  . Food insecurity:    Worry: Not on file    Inability: Not on file  . Transportation needs:    Medical: Not on file    Non-medical: Not on file  Tobacco Use  . Smoking status: Never Smoker  . Smokeless tobacco: Never Used  Substance and Sexual Activity  . Alcohol use: Yes    Comment: beer and wine occasional  . Drug use: No  . Sexual activity: Yes  Lifestyle  . Physical activity:    Days per week: Not on file    Minutes per session: Not on file  . Stress: Not on file  Relationships  . Social connections:    Talks on phone: Not on file    Gets together: Not on file    Attends religious service: Not on file    Active member of club or organization: Not on file    Attends meetings of clubs or organizations: Not on file    Relationship status: Not on file  . Intimate partner violence:    Fear of current or ex partner: Not  on file    Emotionally abused: Not on file    Physically abused: Not on file    Forced sexual activity: Not on file  Other Topics Concern  . Not on file  Social History Narrative   Lives w/ wife     Family History  Problem Relation Age of Onset  . Prostate cancer Brother   . Hypertension Other        several fam members   . Prostate cancer Brother   . Colon cancer Neg Hx   . CAD Neg Hx   . Diabetes Neg Hx   . Stroke Neg Hx      Allergies as of 09/06/2018      Reactions   Gadolinium Derivatives Nausea And Vomiting   Pt was given IV Zofran per Dr Kathlene Cote prior to imaging for n/v   Pt states prev n/v from gad at GI from MRA      Medication List        Accurate as of 09/06/18  6:37 PM. Always use your most recent med list.  atorvastatin 40 MG tablet Commonly known as:  LIPITOR Take 1 tablet (40 mg total) by mouth at bedtime.   levothyroxine 125 MCG tablet Commonly known as:  SYNTHROID, LEVOTHROID Take 1 tablet (125 mcg total) by mouth daily before breakfast.   metoprolol succinate 100 MG 24 hr tablet Commonly known as:  TOPROL-XL Take 1 tablet (100 mg total) by mouth daily. Take with or immediately following a meal.   triamcinolone 0.025 % ointment Commonly known as:  KENALOG Apply 1 application topically daily as needed.          Objective:   Physical Exam BP (!) 139/93 (BP Location: Left Arm, Patient Position: Sitting, Cuff Size: Large)   Pulse 70   Temp 98.4 F (36.9 C) (Oral)   Ht 5\' 6"  (1.676 m)   Wt 203 lb 9.6 oz (92.4 kg)   SpO2 100%   BMI 32.86 kg/m  General: Well developed, NAD, see BMI.  Neck: No  thyromegaly  HEENT:  Normocephalic . Face symmetric, atraumatic Lungs:  CTA B Normal respiratory effort, no intercostal retractions, no accessory muscle use. Heart: RRR,  no murmur.  No pretibial edema bilaterally  Abdomen:  Not distended, soft, non-tender. No rebound or rigidity.   Skin: few varicose veins both legs, above and  below knee, no phlebitis Rectal: External abnormalities: none. Normal sphincter tone. No rectal masses or tenderness.  No stools Prostate: Prostate gland firm and smooth, no enlargement, nodularity, tenderness, mass, asymmetry or induration Neurologic:  alert & oriented X3.  Speech normal, gait appropriate for age and unassisted Strength symmetric and appropriate for age.  Psych: Cognition and judgment appear intact.  Cooperative with normal attention span and concentration.  Behavior appropriate. No anxious or depressed appearing.     Assessment & Plan:   Assessment HTN Hyperlipidemia Hypothyroidism (hypothyroidism  > S/P  radioiodine) Ascending aortic aneurysm DX 2010, 4.5 cm. MRA 08-2017 stable; Dr. Roxan Hockey Vitiligo Acanthosis Nigricans Varicose veins   PLAN: HTN: On metoprolol, BP slightly elevated, reports he does get ambulatory BPs but does not recall his readings.  No change for now, check BPs, nurse visit in 3 weeks to check his blood pressure here. Hyperlipidemia: On Lipitor, labs Hypothyroidism: On Synthroid, labs. Aortic aneurysm: Recommend to follow-up recommendations regards imaging. Varicose veins, no evidence of phlebitis today, signs and symptoms of phlebitis and DVT discussed. RTC 3 weeks for nurse visit RTC 6 months with me.

## 2018-09-06 NOTE — Assessment & Plan Note (Signed)
HTN: On metoprolol, BP slightly elevated, reports he does get ambulatory BPs but does not recall his readings.  No change for now, check BPs, nurse visit in 3 weeks to check his blood pressure here. Hyperlipidemia: On Lipitor, labs Hypothyroidism: On Synthroid, labs. Aortic aneurysm: Recommend to follow-up recommendations regards imaging. Varicose veins, no evidence of phlebitis today, signs and symptoms of phlebitis and DVT discussed. RTC 3 weeks for nurse visit RTC 6 months with me.

## 2018-09-27 ENCOUNTER — Ambulatory Visit (INDEPENDENT_AMBULATORY_CARE_PROVIDER_SITE_OTHER): Payer: Medicare HMO | Admitting: Internal Medicine

## 2018-09-27 VITALS — BP 153/91 | HR 70

## 2018-09-27 DIAGNOSIS — I10 Essential (primary) hypertension: Secondary | ICD-10-CM

## 2018-09-27 MED ORDER — AMLODIPINE BESYLATE 10 MG PO TABS: 10.0000 mg | ORAL_TABLET | Freq: Every day | ORAL | 3 refills | Status: DC

## 2018-09-27 NOTE — Progress Notes (Signed)
Pre visit review using our clinic tool,if applicable. No additional management support is needed unless otherwise documented below in the visit note.   Pt here for Blood pressure check per Dr.Jose Paz  Pt currently takes: Metoprolol Succinate 100 mg daily. No complaints voiced this visit. Patient brought his home monitor and BP readings.   Pt reports compliance with medication.  BP today  = 153/91      Home Monitor Readings BP=  166/102  P=   73 HR = 70  Pt advised per Dr. Larose Kells add Amlodipine 10 mg daily and continue checking BP's at home. Call office with readings in 1 month. Watch fir side effects and inform Dr. Larose Kells if any develop. Patient agreed.

## 2018-09-27 NOTE — Progress Notes (Signed)
Here for BP check. Ambulatory BPs in the 150s, 160s. BP today by my nurse 161/98, 153/91. Currently on metoprolol Plan:  Add amlodipine 10 mg 1 daily #30 and 3 refills Advised patient to continue checking BPs and call with readings in 1 month.  This medication take few weeks to start working. Watch for side effects mainly lower extremity edema.  Let me know if that is the case. JP

## 2018-09-28 ENCOUNTER — Other Ambulatory Visit: Payer: Medicare HMO

## 2018-10-06 ENCOUNTER — Other Ambulatory Visit: Payer: Self-pay | Admitting: Thoracic Surgery (Cardiothoracic Vascular Surgery)

## 2018-10-06 ENCOUNTER — Ambulatory Visit
Admission: RE | Admit: 2018-10-06 | Discharge: 2018-10-06 | Disposition: A | Discharge status: Home / Self Care | Payer: Medicare HMO | Source: Ambulatory Visit | Attending: Thoracic Surgery (Cardiothoracic Vascular Surgery) | Admitting: Thoracic Surgery (Cardiothoracic Vascular Surgery)

## 2018-10-06 DIAGNOSIS — I712 Thoracic aortic aneurysm, without rupture, unspecified: Secondary | ICD-10-CM

## 2018-10-09 ENCOUNTER — Ambulatory Visit: Payer: Medicare HMO | Admitting: Thoracic Surgery (Cardiothoracic Vascular Surgery)

## 2018-10-09 VITALS — BP 127/86 | HR 68 | Resp 20 | Ht 66.0 in | Wt 206.0 lb

## 2018-10-09 DIAGNOSIS — I712 Thoracic aortic aneurysm, without rupture: Secondary | ICD-10-CM | POA: Diagnosis not present

## 2018-10-09 DIAGNOSIS — I7121 Aneurysm of the ascending aorta, without rupture: Secondary | ICD-10-CM

## 2018-10-09 NOTE — Progress Notes (Signed)
[address]       [place] [address]             [phone]     HPI: Mr. Rostad presents for a scheduled follow-up of his ascending aneurysm  Garron Eline is a 67 year old gentleman past medical history of hypertension, hyperlipidemia, and hypothyroidism following radioactive iodine treatment.  He was found to have a 4.4 cm ascending aneurysm in 2010.  We followed him annually for several years.  Last time I saw him his aneurysm was 4.5 cm.  I recommended that he come back in 6 months, but he refused and presents now for an annual follow-up.  He has been feeling well.  He recently was started on amlodipine 10 mg daily for his blood pressure.  He says is been well controlled since that change was made by Dr. Larose Kells.  He denies any chest pain, shortness of breath, or peripheral edema.  Past Medical History:  Diagnosis Date  . Ascending aortic aneurysm ([place])    dx 2010 ~ 4.4 cm  . Hyperlipidemia   . Hypertension   . Hypothyroidism    s/p hyperthyroidism, s/p radioiodine    Current Outpatient Medications  Medication Sig Dispense Refill  . amLODipine (NORVASC) 10 MG tablet Take 1 tablet (10 mg total) by mouth daily. 30 tablet 3  . atorvastatin (LIPITOR) 40 MG tablet Take 1 tablet (40 mg total) by mouth at bedtime. 90 tablet 1  . levothyroxine (SYNTHROID, LEVOTHROID) 125 MCG tablet Take 1 tablet (125 mcg total) by mouth daily before breakfast. 90 tablet 1  . metoprolol succinate (TOPROL-XL) 100 MG 24 hr tablet Take 1 tablet (100 mg total) by mouth daily. Take with or immediately following a meal. 90 tablet 3  . triamcinolone (KENALOG) 0.025 % ointment Apply 1 application topically daily as needed. 80 g 2   No current facility-administered medications for this visit.     Physical Exam BP 127/86   Pulse 68   Resp 20   Ht 5\' 6"  (1.676 m)   Wt 206 lb (93.4 kg)   SpO2 97%   BMI 33.26 kg/m  67 year old man in no acute distress Well-developed and  well-nourished Alert and oriented x3 with no focal deficits No carotid bruits Cardiac regular rate and rhythm normal S1-S2 Lungs clear with equal breath sounds bilaterally Pulses intact  Diagnostic Tests: MRA CHEST  WITHOUT CONTRAST  TECHNIQUE: Angiographic images of the chest were obtained using MRA technique without intravenous contrast.  CONTRAST:  Patient refused  COMPARISON:  09/21/2017  FINDINGS: VASCULAR  Aorta: No evidence of dissection or stenosis. Transverse dimensions as follows:  4.1 cm sinuses of Valsalva  3.9 cm sino-tubular junction  4.6 cm mid ascending (stable)  3.6 cm distal ascending/proximal arch  2.9 cm distal arch  3.3 cm proximal descending  2.7 cm distal descending  Bovine variant brachiocephalic arterial origin anatomy without suggestion of proximal stenosis. Visualized portions of proximal abdominal aorta unremarkable.  Heart: Normal size.  No pericardial effusion.  Pulmonary Arteries: Normal in caliber centrally. Limited evaluation of segmental and subsegmental branches.  Other: No pleural effusion.  NON-VASCULAR  Spinal cord: Negative limited evaluation  Brachial plexus: Negative limited evaluation  Muscles and tendons: Negative limited evaluation  Bones: Negative limited evaluation  Joints: Negative limited evaluation  IMPRESSION: VASCULAR  1. Stable 4.6 cm mid ascending aortic aneurysm without complicating features.  NON-VASCULAR  1. No acute findings.   Electronically Signed   By: Keturah Barre  Vernard Gambles M.D.   On: 10/06/2018 13:11 I personally reviewed the MR images and concur with the findings noted above.  Patient refused contrast.  Impression: Mr. Radoncic is a 67 year old gentleman with a history of hypertension, hyperlipidemia, and a known ascending aortic aneurysm.  This was first found in 2011 and measured about 4.4 cm at that time.  There has been minimal if any growth over the last 8  years.  I again recommended that we rescan him in 6 months, but he refused to do that and wants to wait for a year.  He is refusing the contrast with MR saying that it makes him sick.  He prefers to continue with MR is rather than do CT angiograms.  Hypertension-blood pressure well controlled today  Plan: Return in 1 year with MR angiogram of chest  Melrose Nakayama, MD [facility] [phone]

## 2018-10-25 ENCOUNTER — Telehealth: Payer: Self-pay

## 2018-10-25 NOTE — Telephone Encounter (Signed)
Pt dropped off BP readings.   10/01/2018 138/94 10/02/2018 123/82 10/03/2018 134/86 10/05/2018 114/80 10/07/2018 110/79 10/08/2018 111/70 10/09/2018 112/74 10/15/2018 106/78 10/16/2018 117/87 10/17/2018 123/77 10/21/2018 105/73 10/22/2018 120/82 10/23/2018 115/78

## 2018-10-25 NOTE — Telephone Encounter (Signed)
Added amlodipine recently, advised patient BP looks better, continue same medications including metoprolol and amlodipine.  Send a refill if needed

## 2018-10-26 NOTE — Telephone Encounter (Signed)
LMOM informing Pt of recommendations. Instructed to call if questions/concerns.  

## 2018-11-26 DIAGNOSIS — R69 Illness, unspecified: Secondary | ICD-10-CM | POA: Diagnosis not present

## 2018-12-03 DIAGNOSIS — R69 Illness, unspecified: Secondary | ICD-10-CM | POA: Diagnosis not present

## 2018-12-11 ENCOUNTER — Other Ambulatory Visit: Payer: Self-pay | Admitting: Internal Medicine

## 2019-02-13 ENCOUNTER — Other Ambulatory Visit: Payer: Self-pay | Admitting: Internal Medicine

## 2019-02-13 DIAGNOSIS — I10 Essential (primary) hypertension: Secondary | ICD-10-CM

## 2019-03-07 ENCOUNTER — Ambulatory Visit: Payer: Medicare HMO | Admitting: Internal Medicine

## 2019-03-12 ENCOUNTER — Ambulatory Visit: Payer: Medicare HMO | Admitting: Internal Medicine

## 2019-03-13 ENCOUNTER — Ambulatory Visit (INDEPENDENT_AMBULATORY_CARE_PROVIDER_SITE_OTHER): Payer: Medicare HMO | Admitting: Internal Medicine

## 2019-03-13 ENCOUNTER — Other Ambulatory Visit: Payer: Self-pay

## 2019-03-13 ENCOUNTER — Ambulatory Visit: Payer: Medicare HMO | Admitting: Internal Medicine

## 2019-03-13 ENCOUNTER — Encounter: Payer: Self-pay | Admitting: Internal Medicine

## 2019-03-13 VITALS — BP 132/84 | HR 84 | Temp 98.2°F | Resp 16 | Ht 66.0 in | Wt 208.2 lb

## 2019-03-13 DIAGNOSIS — E785 Hyperlipidemia, unspecified: Secondary | ICD-10-CM

## 2019-03-13 DIAGNOSIS — I10 Essential (primary) hypertension: Secondary | ICD-10-CM | POA: Diagnosis not present

## 2019-03-13 DIAGNOSIS — I712 Thoracic aortic aneurysm, without rupture, unspecified: Secondary | ICD-10-CM

## 2019-03-13 DIAGNOSIS — E039 Hypothyroidism, unspecified: Secondary | ICD-10-CM | POA: Diagnosis not present

## 2019-03-13 LAB — TSH: TSH: 1.66 u[IU]/mL (ref 0.35–4.50)

## 2019-03-13 NOTE — Progress Notes (Signed)
Pre visit review using our clinic review tool, if applicable. No additional management support is needed unless otherwise documented below in the visit note. 

## 2019-03-13 NOTE — Patient Instructions (Addendum)
GO TO THE LAB : Get the blood work     GO TO THE FRONT DESK Schedule your next appointment  For a physical in 6 months, fasting

## 2019-03-13 NOTE — Progress Notes (Signed)
Subjective:    Patient ID: Dustin Wall, male    DOB: 03-20-1951, 68 y.o.   MRN: [id number]  DOS:  03/13/2019 Type of visit - description: rov Here for routine checkup. No major concerns. Exercising less than before, avoiding the gym due to the coronavirus outbreak. Note from vascular surgery reviewed  Review of Systems Denies chest pain difficulty breathing Nausea, vomiting, diarrhea  Past Medical History:  Diagnosis Date  . Ascending aortic aneurysm ([place])    dx 2010 ~ 4.4 cm  . Hyperlipidemia   . Hypertension   . Hypothyroidism    s/p hyperthyroidism, s/p radioiodine    Past Surgical History:  Procedure Laterality Date  . NO PAST SURGERIES      Social History   Socioeconomic History  . Marital status: Married    Spouse name: Not on file  . Number of children: 0  . Years of education: Not on file  . Highest education level: Not on file  Occupational History  . Occupation: fully retired at age 10, sanitation  Social Needs  . Financial resource strain: Not on file  . Food insecurity:    Worry: Not on file    Inability: Not on file  . Transportation needs:    Medical: Not on file    Non-medical: Not on file  Tobacco Use  . Smoking status: Never Smoker  . Smokeless tobacco: Never Used  Substance and Sexual Activity  . Alcohol use: Yes    Comment: beer and wine occasional  . Drug use: No  . Sexual activity: Yes  Lifestyle  . Physical activity:    Days per week: Not on file    Minutes per session: Not on file  . Stress: Not on file  Relationships  . Social connections:    Talks on phone: Not on file    Gets together: Not on file    Attends religious service: Not on file    Active member of club or organization: Not on file    Attends meetings of clubs or organizations: Not on file    Relationship status: Not on file  . Intimate partner violence:    Fear of current or ex partner: Not on file    Emotionally abused: Not on file    Physically abused:  Not on file    Forced sexual activity: Not on file  Other Topics Concern  . Not on file  Social History Narrative   Lives w/ wife      Allergies as of 03/13/2019      Reactions   Gadolinium Derivatives Nausea And Vomiting   Pt was given IV Zofran per Dr Kathlene Cote prior to imaging for n/v   Pt states prev n/v from gad at GI from MRA      Medication List       Accurate as of March 13, 2019 10:41 AM. Always use your most recent med list.        amLODipine 10 MG tablet Commonly known as:  NORVASC Take 1 tablet (10 mg total) by mouth daily.   atorvastatin 40 MG tablet Commonly known as:  LIPITOR Take 1 tablet (40 mg total) by mouth at bedtime.   levothyroxine 125 MCG tablet Commonly known as:  SYNTHROID, LEVOTHROID Take 1 tablet (125 mcg total) by mouth daily before breakfast.   metoprolol succinate 100 MG 24 hr tablet Commonly known as:  TOPROL-XL Take 1 tablet (100 mg total) by mouth daily. Take with or immediately following a meal.  triamcinolone 0.025 % ointment Commonly known as:  KENALOG Apply 1 application topically daily as needed.           Objective:   Physical Exam BP 132/84 (BP Location: Left Arm, Patient Position: Sitting, Cuff Size: Normal)   Pulse 84   Temp 98.2 F (36.8 C) (Oral)   Resp 16   Ht 5\' 6"  (1.676 m)   Wt 208 lb 4 oz (94.5 kg)   SpO2 98%   BMI 33.61 kg/m  General:   Well developed, NAD, BMI noted. HEENT:  Normocephalic . Face symmetric, atraumatic Lungs:  CTA B Normal respiratory effort, no intercostal retractions, no accessory muscle use. Heart: RRR,  no murmur.  No pretibial edema bilaterally  Skin: Not pale. Not jaundice Neurologic:  alert & oriented X3.  Speech normal, gait appropriate for age and unassisted Psych--  Cognition and judgment appear intact.  Cooperative with normal attention span and concentration.  Behavior appropriate. No anxious or depressed appearing.      Assessment    Assessment HTN  Hyperlipidemia Hypothyroidism (hypothyroidism  > S/P  radioiodine) Ascending aortic aneurysm DX 2010, 4.5 cm. MRA 08-2017 stable; Dr. Roxan Hockey Vitiligo Acanthosis Nigricans Varicose veins   PLAN: HTN: Seems well controlled, continue amlodipine, metoprolol.  Last BMP satisfactory High cholesterol: On atorvastatin, last FLP satisfactory Hypothyroidism: On Synthroid, check a TSH. Ascending aortic aneurysm: Last seen by surgery 10/09/2018,  was recommended to RTC in 1 year Lifestyle discussed, avoiding the gym due to the coronavirus outbreak which is smart,  is still okay to go out and walk in the park, and do some exercises at home.  Diet discussed. RTC 6 months CPX

## 2019-03-14 NOTE — Assessment & Plan Note (Signed)
Assessment HTN Hyperlipidemia Hypothyroidism (hypothyroidism  > S/P  radioiodine) Ascending aortic aneurysm DX 2010, 4.5 cm. MRA 08-2017 stable; Dr. Roxan Hockey Vitiligo Acanthosis Nigricans Varicose veins   PLAN: HTN: Seems well controlled, continue amlodipine, metoprolol.  Last BMP satisfactory High cholesterol: On atorvastatin, last FLP satisfactory Hypothyroidism: On Synthroid, check a TSH. Ascending aortic aneurysm: Last seen by surgery 10/09/2018,  was recommended to RTC in 1 year Lifestyle discussed, avoiding the gym due to the coronavirus outbreak which is smart,  is still okay to go out and walk in the park, and do some exercises at home.  Diet discussed. RTC 6 months CPX

## 2019-05-15 ENCOUNTER — Other Ambulatory Visit: Payer: Self-pay

## 2019-05-15 ENCOUNTER — Encounter: Payer: Self-pay | Admitting: Internal Medicine

## 2019-05-15 ENCOUNTER — Ambulatory Visit (INDEPENDENT_AMBULATORY_CARE_PROVIDER_SITE_OTHER): Payer: Medicare HMO | Admitting: Internal Medicine

## 2019-05-15 VITALS — BP 119/79 | HR 70 | Temp 97.6°F | Resp 16 | Ht 66.0 in | Wt 208.2 lb

## 2019-05-15 DIAGNOSIS — R1010 Upper abdominal pain, unspecified: Secondary | ICD-10-CM

## 2019-05-15 MED ORDER — PANTOPRAZOLE SODIUM 40 MG PO TBEC: 40.0000 mg | DELAYED_RELEASE_TABLET | Freq: Every day | ORAL | 1 refills | Status: DC

## 2019-05-15 NOTE — Patient Instructions (Signed)
GO TO THE LAB : Get the blood work     GO TO THE FRONT DESK Schedule your next appointment  For a check up in 2 weeks   Start taking pantoprazole, early in the morning before your breakfast every day.  This is an acid reducer  If you have severe symptoms call anytime  Go to the ER if you have chest pain or upper back pain  If your symptoms are not improving within the next few days let me know

## 2019-05-15 NOTE — Progress Notes (Signed)
Subjective:    Patient ID: Dustin Wall, male    DOB: 1951-07-14, 68 y.o.   MRN: [id number]  DOS:  05/15/2019 Type of visit - description: Acute visit 1 week history of abdominal pain. The pain is a steady with on and off exacerbation.  The pain is worse after he eats.  At times it has been as intense as 9/10. There is no radiation, it is located at the epigastric area,  Described as bloated, not burning  I ask about back pain or radiation to the back:  he only has occasional low back pain on and off which is mild.  Review of Systems Denies fever chills Appetite remains good.  No weight loss No chest pain no difficulty breathing. No thoracic pain. No nausea, vomiting, diarrhea.  No blood in the stools. No GERD type of symptoms No rash Denies dysuria, gross hematuria or difficulty urinating no NSAIDs   Past Medical History:  Diagnosis Date  . Ascending aortic aneurysm ([place])    dx 2010 ~ 4.4 cm  . Hyperlipidemia   . Hypertension   . Hypothyroidism    s/p hyperthyroidism, s/p radioiodine    Past Surgical History:  Procedure Laterality Date  . NO PAST SURGERIES      Social History   Socioeconomic History  . Marital status: Married    Spouse name: Not on file  . Number of children: 0  . Years of education: Not on file  . Highest education level: Not on file  Occupational History  . Occupation: fully retired at age 49, sanitation  Social Needs  . Financial resource strain: Not on file  . Food insecurity:    Worry: Not on file    Inability: Not on file  . Transportation needs:    Medical: Not on file    Non-medical: Not on file  Tobacco Use  . Smoking status: Never Smoker  . Smokeless tobacco: Never Used  Substance and Sexual Activity  . Alcohol use: Yes    Comment: beer and wine occasional  . Drug use: No  . Sexual activity: Yes  Lifestyle  . Physical activity:    Days per week: Not on file    Minutes per session: Not on file  . Stress: Not on file   Relationships  . Social connections:    Talks on phone: Not on file    Gets together: Not on file    Attends religious service: Not on file    Active member of club or organization: Not on file    Attends meetings of clubs or organizations: Not on file    Relationship status: Not on file  . Intimate partner violence:    Fear of current or ex partner: Not on file    Emotionally abused: Not on file    Physically abused: Not on file    Forced sexual activity: Not on file  Other Topics Concern  . Not on file  Social History Narrative   Lives w/ wife      Allergies as of 05/15/2019      Reactions   Gadolinium Derivatives Nausea And Vomiting   Pt was given IV Zofran per Dr Kathlene Cote prior to imaging for n/v   Pt states prev n/v from gad at GI from MRA      Medication List       Accurate as of May 15, 2019  2:08 PM. If you have any questions, ask your nurse or doctor.  amLODipine 10 MG tablet Commonly known as:  NORVASC Take 1 tablet (10 mg total) by mouth daily.   atorvastatin 40 MG tablet Commonly known as:  LIPITOR Take 1 tablet (40 mg total) by mouth at bedtime.   levothyroxine 125 MCG tablet Commonly known as:  SYNTHROID Take 1 tablet (125 mcg total) by mouth daily before breakfast.   metoprolol succinate 100 MG 24 hr tablet Commonly known as:  TOPROL-XL Take 1 tablet (100 mg total) by mouth daily. Take with or immediately following a meal.   triamcinolone 0.025 % ointment Commonly known as:  KENALOG Apply 1 application topically daily as needed.           Objective:   Physical Exam Constitutional:       BP 119/79 (BP Location: Left Arm, Patient Position: Sitting, Cuff Size: Small)   Pulse 70   Temp 97.6 F (36.4 C) (Oral)   Resp 16   Ht 5\' 6"  (1.676 m)   Wt 208 lb 4 oz (94.5 kg)   SpO2 100%   BMI 33.61 kg/m  General:   Well developed, NAD, BMI noted.  HEENT:  Normocephalic . Face symmetric, atraumatic Lungs:  CTA B Normal  respiratory effort, no intercostal retractions, no accessory muscle use. Heart: RRR,  no murmur.  no pretibial edema bilaterally  Abdomen:  Not distended, soft, non-tender. No rebound or rigidity.  See graphic Skin: Not pale. Not jaundice Neurologic:  alert & oriented X3.  Speech normal, gait appropriate for age and unassisted Psych--  Cognition and judgment appear intact.  Cooperative with normal attention span and concentration.  Behavior appropriate. No anxious or depressed appearing.     Assessment     Assessment HTN Hyperlipidemia Hypothyroidism (hypothyroidism  > S/P  radioiodine) Ascending aortic aneurysm DX 2010, 4.5 cm. MRA 08-2017 stable; Dr. Roxan Hockey Vitiligo Acanthosis Nigricans Varicose veins   PLAN: Upper abdominal pain: The patient presents with 1 week history of upper abdominal pain with no fever, diarrhea or blood in the stools.  Pain increases with p.o. intake.  Abdomen exam is benign, DDX is dyspepsia, PUD, gallbladder, versus others. He has a history of an ascending aortic aneurysm, minimal growth since 2011, last MRI November 2019.  Doubt symptoms are related.  He also reports a history of a previous PUD with endoscopy years ago. Plan: CMP, CBC, amylase lipase. PPIs daily Ultrasound of the abdomen Reassess in 2 weeks, call if not gradually better with PPIs, ER if symptoms severe.

## 2019-05-16 LAB — COMPREHENSIVE METABOLIC PANEL
ALT: 31 U/L (ref 0–53)
AST: 22 U/L (ref 0–37)
Albumin: 4.1 g/dL (ref 3.5–5.2)
Alkaline Phosphatase: 56 U/L (ref 39–117)
BUN: 14 mg/dL (ref 6–23)
CO2: 30 mEq/L (ref 19–32)
Calcium: 9.3 mg/dL (ref 8.4–10.5)
Chloride: 102 mEq/L (ref 96–112)
Creatinine, Ser: 0.99 mg/dL (ref 0.40–1.50)
GFR: 90.75 mL/min (ref >60.00)
Glucose, Bld: 82 mg/dL (ref 70–99)
Potassium: 4.3 mEq/L (ref 3.5–5.1)
Sodium: 139 mEq/L (ref 135–145)
Total Bilirubin: 0.5 mg/dL (ref 0.2–1.2)
Total Protein: 7.3 g/dL (ref 6.0–8.3)

## 2019-05-16 LAB — CBC WITH DIFFERENTIAL/PLATELET
Basophils Absolute: 0.1 10*3/uL (ref 0.0–0.1)
Basophils Relative: 1.1 % (ref 0.0–3.0)
Eosinophils Absolute: 0.1 10*3/uL (ref 0.0–0.7)
Eosinophils Relative: 1.6 % (ref 0.0–5.0)
HCT: 43.8 % (ref 39.0–52.0)
Hemoglobin: 14.6 g/dL (ref 13.0–17.0)
Lymphocytes Relative: 48.1 % — ABNORMAL HIGH (ref 12.0–46.0)
Lymphs Abs: 2.3 10*3/uL (ref 0.7–4.0)
MCHC: 33.3 g/dL (ref 30.0–36.0)
MCV: 84.8 fl (ref 78.0–100.0)
Monocytes Absolute: 0.5 10*3/uL (ref 0.1–1.0)
Monocytes Relative: 10 % (ref 3.0–12.0)
Neutro Abs: 1.9 10*3/uL (ref 1.4–7.7)
Neutrophils Relative %: 39.2 % — ABNORMAL LOW (ref 43.0–77.0)
Platelets: 190 10*3/uL (ref 150.0–400.0)
RBC: 5.16 Mil/uL (ref 4.22–5.81)
RDW: 15.8 % — ABNORMAL HIGH (ref 11.5–15.5)
WBC: 4.8 10*3/uL (ref 4.0–10.5)

## 2019-05-16 LAB — AMYLASE: Amylase: 39 U/L (ref 27–131)

## 2019-05-16 LAB — LIPASE: Lipase: 28 U/L (ref 11.0–59.0)

## 2019-05-16 NOTE — Assessment & Plan Note (Signed)
Upper abdominal pain: The patient presents with 1 week history of upper abdominal pain with no fever, diarrhea or blood in the stools.  Pain increases with p.o. intake.  Abdomen exam is benign, DDX is dyspepsia, PUD, gallbladder, versus others. He has a history of an ascending aortic aneurysm, minimal growth since 2011, last MRI November 2019.  Doubt symptoms are related.  He also reports a history of a previous PUD with endoscopy years ago. Plan: CMP, CBC, amylase lipase. PPIs daily Ultrasound of the abdomen Reassess in 2 weeks, call if not gradually better with PPIs, ER if symptoms severe.

## 2019-05-21 ENCOUNTER — Ambulatory Visit (HOSPITAL_BASED_OUTPATIENT_CLINIC_OR_DEPARTMENT_OTHER)
Admission: RE | Admit: 2019-05-21 | Discharge: 2019-05-21 | Disposition: A | Discharge status: Home / Self Care | Payer: Medicare HMO | Source: Ambulatory Visit | Attending: Internal Medicine | Admitting: Internal Medicine

## 2019-05-21 ENCOUNTER — Other Ambulatory Visit: Payer: Self-pay

## 2019-05-21 DIAGNOSIS — R1010 Upper abdominal pain, unspecified: Secondary | ICD-10-CM | POA: Diagnosis not present

## 2019-05-29 ENCOUNTER — Encounter: Payer: Self-pay | Admitting: Internal Medicine

## 2019-05-29 ENCOUNTER — Ambulatory Visit (INDEPENDENT_AMBULATORY_CARE_PROVIDER_SITE_OTHER): Payer: Medicare HMO | Admitting: Internal Medicine

## 2019-05-29 ENCOUNTER — Other Ambulatory Visit: Payer: Self-pay

## 2019-05-29 VITALS — BP 120/82 | Ht 66.0 in

## 2019-05-29 DIAGNOSIS — R1010 Upper abdominal pain, unspecified: Secondary | ICD-10-CM | POA: Diagnosis not present

## 2019-05-29 NOTE — Progress Notes (Signed)
Subjective:    Patient ID: Dustin Wall, male    DOB: Mar 13, 1951, 68 y.o.   MRN: [id number]  DOS:  05/29/2019 Type of visit - description: Attempted  to make this a video visit, due to technical difficulties from the patient side it was not possible  thus we proceeded with a Virtual Visit via Telephone    I connected with@ on 05/30/19 at 10:20 AM EDT by telephone and verified that I am speaking with the correct person using two identifiers.  THIS ENCOUNTER IS A VIRTUAL VISIT DUE TO COVID-19 - PATIENT WAS NOT SEEN IN THE OFFICE. PATIENT HAS CONSENTED TO VIRTUAL VISIT / TELEMEDICINE VISIT   Location of patient: home  Location of provider: office  I discussed the limitations, risks, security and privacy concerns of performing an evaluation and management service by telephone and the availability of in person appointments. I also discussed with the patient that there may be a patient responsible charge related to this service. The patient expressed understanding and agreed to proceed.   History of Present Illness: Follow-up from previous visit He was seen with upper abdominal pain. Labs normal, ultrasound of the abdomen showed gallbladder polyp. The patient reports today that the pain is less severe but is still there, is located mostly at the upper abdomen, increased with food. Described as sharp/bloated feeling. Good compliance with PPIs   Review of Systems Denies fever chills or weight loss No nausea, vomiting, diarrhea.  No blood in the stools  Past Medical History:  Diagnosis Date  . Ascending aortic aneurysm ([place])    dx 2010 ~ 4.4 cm  . Hyperlipidemia   . Hypertension   . Hypothyroidism    s/p hyperthyroidism, s/p radioiodine    Past Surgical History:  Procedure Laterality Date  . NO PAST SURGERIES      Social History   Socioeconomic History  . Marital status: Married    Spouse name: Not on file  . Number of children: 0  . Years of education: Not on file  .  Highest education level: Not on file  Occupational History  . Occupation: fully retired at age 1, sanitation  Social Needs  . Financial resource strain: Not on file  . Food insecurity:    Worry: Not on file    Inability: Not on file  . Transportation needs:    Medical: Not on file    Non-medical: Not on file  Tobacco Use  . Smoking status: Never Smoker  . Smokeless tobacco: Never Used  Substance and Sexual Activity  . Alcohol use: Yes    Comment: beer and wine occasional  . Drug use: No  . Sexual activity: Yes  Lifestyle  . Physical activity:    Days per week: Not on file    Minutes per session: Not on file  . Stress: Not on file  Relationships  . Social connections:    Talks on phone: Not on file    Gets together: Not on file    Attends religious service: Not on file    Active member of club or organization: Not on file    Attends meetings of clubs or organizations: Not on file    Relationship status: Not on file  . Intimate partner violence:    Fear of current or ex partner: Not on file    Emotionally abused: Not on file    Physically abused: Not on file    Forced sexual activity: Not on file  Other Topics Concern  .  Not on file  Social History Narrative   Lives w/ wife      Allergies as of 05/29/2019      Reactions   Gadolinium Derivatives Nausea And Vomiting   Pt was given IV Zofran per Dr Kathlene Cote prior to imaging for n/v   Pt states prev n/v from gad at GI from MRA      Medication List       Accurate as of May 29, 2019 11:59 PM. If you have any questions, ask your nurse or doctor.        amLODipine 10 MG tablet Commonly known as:  NORVASC Take 1 tablet (10 mg total) by mouth daily.   atorvastatin 40 MG tablet Commonly known as:  LIPITOR Take 1 tablet (40 mg total) by mouth at bedtime.   levothyroxine 125 MCG tablet Commonly known as:  SYNTHROID Take 1 tablet (125 mcg total) by mouth daily before breakfast.   metoprolol succinate 100 MG 24 hr  tablet Commonly known as:  TOPROL-XL Take 1 tablet (100 mg total) by mouth daily. Take with or immediately following a meal.   pantoprazole 40 MG tablet Commonly known as:  PROTONIX Take 1 tablet (40 mg total) by mouth daily before breakfast.   triamcinolone 0.025 % ointment Commonly known as:  KENALOG Apply 1 application topically daily as needed.           Objective:   Physical Exam BP 120/82   Ht 5\' 6"  (1.676 m)   BMI 33.61 kg/m  This is a virtual phone visit, he is alert oriented x3.  BP today 120/82      Assessment     Assessment HTN Hyperlipidemia Hypothyroidism (hypothyroidism  > S/P  radioiodine) Ascending aortic aneurysm DX 2010, 4.5 cm. MRA 08-2017 stable; Dr. Roxan Hockey Vitiligo Acanthosis Nigricans Varicose veins   PLAN: Upper abdominal pain: Since the last visit, labs were okay, ultrasound showed a gallbladder polyp.  He is taking PPIs, pain decreased but not gone.  Since he is 67 he might need further work-up for this new onset abdominal discomfort (noting that he had a EGD 5 years ago) Plan: Refer to GI, Dr. Ardis Hughs Gallbladder polyp: Recheck ultrasound in 1 year     I discussed the assessment and treatment plan with the patient. The patient was provided an opportunity to ask questions and all were answered. The patient agreed with the plan and demonstrated an understanding of the instructions.   The patient was advised to call back or seek an in-person evaluation if the symptoms worsen or if the condition fails to improve as anticipated.  I provided 15 minutes of non-face-to-face time during this encounter.  Kathlene November, MD

## 2019-05-30 NOTE — Assessment & Plan Note (Signed)
Upper abdominal pain: Since the last visit, labs were okay, ultrasound showed a gallbladder polyp.  He is taking PPIs, pain decreased but not gone.  Since he is 28 he might need further work-up for this new onset abdominal discomfort (noting that he had a EGD 5 years ago) Plan: Refer to GI, Dr. Ardis Hughs Gallbladder polyp: Recheck ultrasound in 1 year

## 2019-06-07 ENCOUNTER — Other Ambulatory Visit: Payer: Self-pay | Admitting: Internal Medicine

## 2019-06-11 ENCOUNTER — Other Ambulatory Visit: Payer: Self-pay

## 2019-06-11 ENCOUNTER — Encounter: Payer: Self-pay | Admitting: Gastroenterology

## 2019-06-11 ENCOUNTER — Ambulatory Visit (INDEPENDENT_AMBULATORY_CARE_PROVIDER_SITE_OTHER): Payer: Medicare HMO | Admitting: Gastroenterology

## 2019-06-11 VITALS — Ht 66.0 in | Wt 200.0 lb

## 2019-06-11 DIAGNOSIS — R1013 Epigastric pain: Secondary | ICD-10-CM | POA: Diagnosis not present

## 2019-06-11 NOTE — Patient Instructions (Addendum)
We will arrange an upper endoscopy at his soonest convenience for epigastric pains.  Thank you for entrusting me with your care and choosing [facility].  Dr Ardis Hughs

## 2019-06-11 NOTE — Progress Notes (Signed)
This service was provided via virtual visit.  Only audio was used.  The patient was located at home.  I was located in my office.  The patient did consent to this virtual visit and is aware of possible charges through their insurance for this visit.  He was last in our office about 5 years ago.  My certified medical assistant, Grace Bushy, contributed to this visit by contacting the patient by phone 1 or 2 business days prior to the appointment and also followed up on the recommendations I made after the visit.  Time spent on virtual visit: 24 minutes   HPI: This is a very pleasant 68 year old man whom I last saw 4-1/2 years ago at the time of an upper endoscopy.  I did an upper endoscopy for him December 2015 for epigastric pain.  There was mild distal gastritis that was nonspecific.  Pathology was essentially normal.  I recommended that he continue his Protonix once daily since it seemed to be helping him fairly well.  Recommend that it was best to be taken 20 to 30 minutes prior to meal.  Abdominal ultrasound May 2020 for upper abdominal pain showed "probable 8 mm gallbladder polyp.  Follow-up ultrasound in 1 year is recommended to ensure stability and rule out neoplasm."  Also fatty liver was noted.  Lab testing May 2020 shows normal CBC, normal complete metabolic profile, normal amylase and lipase.  For 3 weeks he's had severe stomach pains.  He rates it at 8 or 9 out of 10.  Every single day, regardless of what he eats or drinks. Feels bloating.  The pains can last all day but sometime it is intermittent.  Starts in epigastrium, radiates to lower abdomen. No nausea or vomiting. The pain is not positional.    He does not take any OTC pain meds very often at all. No ASA.    Says he had a 'slight case of ulcers back in [place]' many years ago.  Overall his weight fluctuates.  He takes pantoprazole every day, this is for epigastic pains.  He never skips a day.  No bleeding, no constipation  or diarrhea.  MRA of the chest October 2019 shows "stable 4.6 cm mid ascending aortic aneurysm without complicating features"  Chief complaint is epigastric abdominal pain  ROS: complete GI ROS as described in HPI, all other review negative.  Constitutional:  No unintentional weight loss   Past Medical History:  Diagnosis Date  . Ascending aortic aneurysm ([place])    dx 2010 ~ 4.4 cm  . Hyperlipidemia   . Hypertension   . Hypothyroidism    s/p hyperthyroidism, s/p radioiodine    Past Surgical History:  Procedure Laterality Date  . NO PAST SURGERIES      Current Outpatient Medications  Medication Sig Dispense Refill  . amLODipine (NORVASC) 10 MG tablet Take 1 tablet (10 mg total) by mouth daily. 90 tablet 3  . atorvastatin (LIPITOR) 40 MG tablet Take 1 tablet (40 mg total) by mouth at bedtime. 90 tablet 2  . levothyroxine (SYNTHROID, LEVOTHROID) 125 MCG tablet Take 1 tablet (125 mcg total) by mouth daily before breakfast. 90 tablet 2  . metoprolol succinate (TOPROL-XL) 100 MG 24 hr tablet Take 1 tablet (100 mg total) by mouth daily. Take with or immediately following a meal. 90 tablet 2  . pantoprazole (PROTONIX) 40 MG tablet Take 1 tablet (40 mg total) by mouth daily before breakfast. 90 tablet 3   No current facility-administered medications for  this visit.     Allergies as of 06/11/2019 - Review Complete 06/11/2019  Allergen Reaction Noted  . Gadolinium derivatives Nausea And Vomiting 09/21/2017    Family History  Problem Relation Age of Onset  . Prostate cancer Brother   . Hypertension Other        several fam members   . Prostate cancer Brother   . Cancer Father   . Colon cancer Neg Hx   . CAD Neg Hx   . Diabetes Neg Hx   . Stroke Neg Hx     Social History   Socioeconomic History  . Marital status: Married    Spouse name: Not on file  . Number of children: 0  . Years of education: Not on file  . Highest education level: Not on file  Occupational History   . Occupation: fully retired at age 38, sanitation  Social Needs  . Financial resource strain: Not on file  . Food insecurity    Worry: Not on file    Inability: Not on file  . Transportation needs    Medical: Not on file    Non-medical: Not on file  Tobacco Use  . Smoking status: Never Smoker  . Smokeless tobacco: Never Used  Substance and Sexual Activity  . Alcohol use: Yes    Comment: beer and wine occasional  . Drug use: No  . Sexual activity: Yes  Lifestyle  . Physical activity    Days per week: Not on file    Minutes per session: Not on file  . Stress: Not on file  Relationships  . Social Herbalist on phone: Not on file    Gets together: Not on file    Attends religious service: Not on file    Active member of club or organization: Not on file    Attends meetings of clubs or organizations: Not on file    Relationship status: Not on file  . Intimate partner violence    Fear of current or ex partner: Not on file    Emotionally abused: Not on file    Physically abused: Not on file    Forced sexual activity: Not on file  Other Topics Concern  . Not on file  Social History Narrative   Lives w/ wife     Physical Exam: Unable to perform because this was a "telemed visit" due to current Covid-19 pandemic  Assessment and plan: 68 y.o. male with epigastric abdominal pain  Unclear etiology.  It seems unlikely that the finding in his gallbladder on recent ultrasound accounts for the pain.  Possibly he has an ulcer however he does not take NSAIDs very often at all or aspirin.  I recommended an EGD at his soonest convenience.  He will stay on proton pump inhibitor once daily for now.  I see no reason for any further blood tests or imaging studies prior to then.  Please see the "Patient Instructions" section for addition details about the plan.  Owens Loffler, MD [facility] 06/11/2019, 10:17 AM

## 2019-07-03 ENCOUNTER — Encounter: Payer: Self-pay | Admitting: Gastroenterology

## 2019-07-08 ENCOUNTER — Telehealth: Payer: Self-pay | Admitting: Gastroenterology

## 2019-07-08 NOTE — Telephone Encounter (Signed)

## 2019-07-09 ENCOUNTER — Other Ambulatory Visit: Payer: Self-pay

## 2019-07-09 ENCOUNTER — Ambulatory Visit (AMBULATORY_SURGERY_CENTER): Payer: Medicare HMO | Admitting: Gastroenterology

## 2019-07-09 ENCOUNTER — Encounter: Payer: Self-pay | Admitting: Gastroenterology

## 2019-07-09 VITALS — BP 121/79 | HR 55 | Temp 99.1°F | Resp 19 | Ht 66.0 in | Wt 200.0 lb

## 2019-07-09 DIAGNOSIS — R1013 Epigastric pain: Secondary | ICD-10-CM | POA: Diagnosis not present

## 2019-07-09 DIAGNOSIS — K2990 Gastroduodenitis, unspecified, without bleeding: Secondary | ICD-10-CM

## 2019-07-09 DIAGNOSIS — K2970 Gastritis, unspecified, without bleeding: Secondary | ICD-10-CM

## 2019-07-09 DIAGNOSIS — I10 Essential (primary) hypertension: Secondary | ICD-10-CM | POA: Diagnosis not present

## 2019-07-09 MED ORDER — SODIUM CHLORIDE 0.9 % IV SOLN: 500.0000 mL | Freq: Once | INTRAVENOUS | Status: DC

## 2019-07-09 NOTE — Progress Notes (Signed)
Temp-Nancy Sylvester CRNA is interviewing pt regarding when he had water last. He will have his procedure at 1500

## 2019-07-09 NOTE — Progress Notes (Signed)
Report given to PACU, vss 

## 2019-07-09 NOTE — Patient Instructions (Signed)
Read all of the handouts given to you by your recovery room nurse.  Thank-you for choosing [place] for your healthcare needs today.  YOU HAD AN ENDOSCOPIC PROCEDURE TODAY AT [facility]:   Refer to the procedure report that was given to you for any specific questions about what was found during the examination.  If the procedure report does not answer your questions, please call your gastroenterologist to clarify.  If you requested that your care partner not be given the details of your procedure findings, then the procedure report has been included in a sealed envelope for you to review at your convenience later.  YOU SHOULD EXPECT: Some feelings of bloating in the abdomen. Passage of more gas than usual.  Walking can help get rid of the air that was put into your GI tract during the procedure and reduce the bloating.  Please Note:  You might notice some irritation and congestion in your nose or some drainage.  This is from the oxygen used during your procedure.  There is no need for concern and it should clear up in a day or so.  SYMPTOMS TO REPORT IMMEDIATELY:   Following upper endoscopy (EGD)  Vomiting of blood or coffee ground material  New chest pain or pain under the shoulder blades  Painful or persistently difficult swallowing  New shortness of breath  Fever of 100F or higher  Black, tarry-looking stools  For urgent or emergent issues, a gastroenterologist can be reached at any hour by calling [phone].  DIET:  We do recommend a small meal at first, but then you may proceed to your regular diet.  Drink plenty of fluids but you should avoid alcoholic beverages for 24 hours.  ACTIVITY:  You should plan to take it easy for the rest of today and you should NOT DRIVE or use heavy machinery until tomorrow (because of the sedation medicines used during the test).    FOLLOW UP: Our staff will call the number listed on your records 48-72 hours following your procedure to  check on you and address any questions or concerns that you may have regarding the information given to you following your procedure. If we do not reach you, we will leave a message.  We will attempt to reach you two times.  During this call, we will ask if you have developed any symptoms of COVID 19. If you develop any symptoms (ie: fever, flu-like symptoms, shortness of breath, cough etc.) before then, please call [phone].  If you test positive for Covid 19 in the 2 weeks post procedure, please call and report this information to [place].    If any biopsies were taken you will be contacted by phone or by letter within the next 1-3 weeks.  Please call us at [phone] if you have not heard about the biopsies in 3 weeks.    SIGNATURES/CONFIDENTIALITY: You and/or your care partner have signed paperwork which will be entered into your electronic medical record.  These signatures attest to the fact that that the information above on your After Visit Summary has been reviewed and is understood.  Full responsibility of the confidentiality of this discharge information lies with you and/or your care-partner.

## 2019-07-09 NOTE — Progress Notes (Signed)
Patient's wife did not bring her learners permit, and states for [place] just to 'bring him to the car, and they will wait for the "ride."  Supervisor notified, and spoke with them both.  Patient and wife will call neighbor to drive them home.  To holding area. Discharged per Dr. Ardis Hughs.

## 2019-07-09 NOTE — Progress Notes (Signed)
Called to room to assist during endoscopic procedure.  Patient ID and intended procedure confirmed with present staff. Received instructions for my participation in the procedure from the performing physician.  

## 2019-07-09 NOTE — Op Note (Signed)
[facility] Patient Name: Dustin Wall Procedure Date: 07/09/2019 2:31 PM MRN: [id number] Endoscopist: Milus Banister , MD Age: 68 Referring MD:  Date of Birth: 21-Jan-1951 Gender: Male Account #: [id number] Procedure:                Upper GI endoscopy Indications:              Epigastric abdominal pain Medicines:                Monitored Anesthesia Care Procedure:                Pre-Anesthesia Assessment:                           - Prior to the procedure, a History and Physical                            was performed, and patient medications and                            allergies were reviewed. The patient's tolerance of                            previous anesthesia was also reviewed. The risks                            and benefits of the procedure and the sedation                            options and risks were discussed with the patient.                            All questions were answered, and informed consent                            was obtained. Prior Anticoagulants: The patient has                            taken no previous anticoagulant or antiplatelet                            agents. ASA Grade Assessment: II - A patient with                            mild systemic disease. After reviewing the risks                            and benefits, the patient was deemed in                            satisfactory condition to undergo the procedure.                           After obtaining informed consent, the endoscope was  passed under direct vision. Throughout the                            procedure, the patient's blood pressure, pulse, and                            oxygen saturations were monitored continuously. The                            Endoscope was introduced through the mouth, and                            advanced to the second part of duodenum. The upper                            GI endoscopy was accomplished  without difficulty.                            The patient tolerated the procedure well. Scope In: Scope Out: Findings:                 Mild inflammation characterized by erythema and                            friability was found in the gastric antrum.                            Biopsies were taken with a cold forceps for                            histology.                           The exam was otherwise without abnormality. Complications:            No immediate complications. Estimated blood loss:                            None. Estimated Blood Loss:     Estimated blood loss: none. Impression:               - Mild gastritis. Biopsied.                           - The examination was otherwise normal. Recommendation:           - Patient has a contact number available for                            emergencies. The signs and symptoms of potential                            delayed complications were discussed with the                            patient. Return to normal activities tomorrow.  Written discharge instructions were provided to the                            patient.                           - Resume previous diet.                           - Continue present medications.                           - Await pathology results. Milus Banister, MD 07/09/2019 2:51:53 PM This report has been signed electronically.

## 2019-07-11 ENCOUNTER — Telehealth: Payer: Self-pay

## 2019-07-11 NOTE — Telephone Encounter (Signed)
  Follow up Call-  Call back number 07/09/2019  Post procedure Call Back phone  # [phone]  Permission to leave phone message Yes  Some recent data might be hidden     Patient questions:  Do you have a fever, pain , or abdominal swelling? No. Pain Score  0 *  Have you tolerated food without any problems? Yes.    Have you been able to return to your normal activities? Yes.    Do you have any questions about your discharge instructions: Diet   No. Medications  No. Follow up visit  No.  Do you have questions or concerns about your Care? No.  Actions: * If pain score is 4 or above: 1. No action needed, pain <4.Have you developed a fever since your procedure? no  2.   Have you had an respiratory symptoms (SOB or cough) since your procedure? no  3.   Have you tested positive for COVID 19 since your procedure no  4.   Have you had any family members/close contacts diagnosed with the COVID 19 since your procedure?  no   If yes to any of these questions please route to Joylene John, RN and Alphonsa Gin, Therapist, sports.

## 2019-07-18 ENCOUNTER — Encounter: Payer: Self-pay | Admitting: Gastroenterology

## 2019-08-27 ENCOUNTER — Other Ambulatory Visit: Payer: Self-pay | Admitting: *Deleted

## 2019-08-27 DIAGNOSIS — I712 Thoracic aortic aneurysm, without rupture, unspecified: Secondary | ICD-10-CM

## 2019-09-10 ENCOUNTER — Encounter: Payer: Self-pay | Admitting: Internal Medicine

## 2019-09-11 ENCOUNTER — Encounter: Payer: Self-pay | Admitting: Internal Medicine

## 2019-09-11 ENCOUNTER — Other Ambulatory Visit: Payer: Self-pay

## 2019-09-11 ENCOUNTER — Ambulatory Visit (INDEPENDENT_AMBULATORY_CARE_PROVIDER_SITE_OTHER): Payer: Medicare HMO | Admitting: Internal Medicine

## 2019-09-11 VITALS — BP 124/83 | HR 69 | Temp 98.6°F | Resp 18 | Ht 66.0 in | Wt 204.8 lb

## 2019-09-11 DIAGNOSIS — I10 Essential (primary) hypertension: Secondary | ICD-10-CM

## 2019-09-11 DIAGNOSIS — Z8042 Family history of malignant neoplasm of prostate: Secondary | ICD-10-CM

## 2019-09-11 DIAGNOSIS — E785 Hyperlipidemia, unspecified: Secondary | ICD-10-CM | POA: Diagnosis not present

## 2019-09-11 DIAGNOSIS — Z23 Encounter for immunization: Secondary | ICD-10-CM | POA: Diagnosis not present

## 2019-09-11 DIAGNOSIS — M25511 Pain in right shoulder: Secondary | ICD-10-CM | POA: Diagnosis not present

## 2019-09-11 DIAGNOSIS — Z0001 Encounter for general adult medical examination with abnormal findings: Secondary | ICD-10-CM

## 2019-09-11 DIAGNOSIS — Z0000 Encounter for general adult medical examination without abnormal findings: Secondary | ICD-10-CM

## 2019-09-11 DIAGNOSIS — E039 Hypothyroidism, unspecified: Secondary | ICD-10-CM

## 2019-09-11 LAB — LIPID PANEL
Cholesterol: 204 mg/dL — ABNORMAL HIGH (ref 0–200)
HDL: 49.2 mg/dL (ref >39.00)
LDL Cholesterol: 121 mg/dL — ABNORMAL HIGH (ref 0–99)
NonHDL: 154.61
Total CHOL/HDL Ratio: 4
Triglycerides: 170 mg/dL — ABNORMAL HIGH (ref 0.0–149.0)
VLDL: 34 mg/dL (ref 0.0–40.0)

## 2019-09-11 LAB — PSA: PSA: 2.21 ng/mL (ref 0.10–4.00)

## 2019-09-11 LAB — TSH: TSH: 3.4 u[IU]/mL (ref 0.35–4.50)

## 2019-09-11 MED ORDER — HYDROCORTISONE 2.5 % EX CREA: TOPICAL_CREAM | Freq: Two times a day (BID) | CUTANEOUS | 1 refills | Status: DC

## 2019-09-11 NOTE — Patient Instructions (Addendum)
GO TO THE LAB : Get the blood work     GO TO THE FRONT DESK Schedule your next appointment   for a checkup in 6 months   Continue pantoprazole for 4 weeks, then stop

## 2019-09-11 NOTE — Progress Notes (Signed)
Subjective:    Patient ID: Dustin Wall, male    DOB: 1951-10-08, 68 y.o.   MRN: [id number]  DOS:  09/11/2019 Type of visit - description: CPX Here for CPX, in general feeling well. Multiple other issues discussed.  BP Readings from Last 3 Encounters:  09/11/19 124/83  07/09/19 121/79  05/29/19 120/82    Review of Systems Has a spot at the left scalp that itches from time to time, same at the abdominal fold (right side) The areas are never blistery, scaly they simply itch and topical steroid helps.  Right shoulder pain for few months, mostly with arm elevation   Other than above, a 14 point review of systems is negative    Past Medical History:  Diagnosis Date  . Ascending aortic aneurysm ([place])    dx 2010 ~ 4.4 cm  . GERD (gastroesophageal reflux disease)   . Hyperlipidemia   . Hypertension   . Hypothyroidism    s/p hyperthyroidism, s/p radioiodine    Past Surgical History:  Procedure Laterality Date  . HEMORRHOID SURGERY      Social History   Socioeconomic History  . Marital status: Married    Spouse name: Not on file  . Number of children: 0  . Years of education: Not on file  . Highest education level: Not on file  Occupational History  . Occupation: fully retired at age 47, sanitation  Social Needs  . Financial resource strain: Not on file  . Food insecurity    Worry: Not on file    Inability: Not on file  . Transportation needs    Medical: Not on file    Non-medical: Not on file  Tobacco Use  . Smoking status: Never Smoker  . Smokeless tobacco: Never Used  Substance and Sexual Activity  . Alcohol use: Yes    Comment: beer and wine occasional  . Drug use: No  . Sexual activity: Yes  Lifestyle  . Physical activity    Days per week: Not on file    Minutes per session: Not on file  . Stress: Not on file  Relationships  . Social Herbalist on phone: Not on file    Gets together: Not on file    Attends religious service: Not on  file    Active member of club or organization: Not on file    Attends meetings of clubs or organizations: Not on file    Relationship status: Not on file  . Intimate partner violence    Fear of current or ex partner: Not on file    Emotionally abused: Not on file    Physically abused: Not on file    Forced sexual activity: Not on file  Other Topics Concern  . Not on file  Social History Narrative   Lives w/ wife     Family History  Problem Relation Age of Onset  . Prostate cancer Brother   . Hypertension Other        several fam members   . Prostate cancer Brother   . Cancer Father   . Colon cancer Neg Hx   . CAD Neg Hx   . Diabetes Neg Hx   . Stroke Neg Hx   . Stomach cancer Neg Hx   . Rectal cancer Neg Hx      Allergies as of 09/11/2019      Reactions   Gadolinium Derivatives Nausea And Vomiting   Pt was given IV Zofran per Dr Kathlene Cote  prior to imaging for n/v   Pt states prev n/v from gad at GI from MRA      Medication List       Accurate as of September 11, 2019 11:59 PM. If you have any questions, ask your nurse or doctor.        amLODipine 10 MG tablet Commonly known as: NORVASC Take 1 tablet (10 mg total) by mouth daily.   atorvastatin 40 MG tablet Commonly known as: LIPITOR Take 1 tablet (40 mg total) by mouth at bedtime.   hydrocortisone 2.5 % cream Apply topically 2 (two) times daily. Started by: Kathlene November, MD   levothyroxine 125 MCG tablet Commonly known as: SYNTHROID Take 1 tablet (125 mcg total) by mouth daily before breakfast.   metoprolol succinate 100 MG 24 hr tablet Commonly known as: TOPROL-XL Take 1 tablet (100 mg total) by mouth daily. Take with or immediately following a meal.   pantoprazole 40 MG tablet Commonly known as: PROTONIX Take 1 tablet (40 mg total) by mouth daily before breakfast.           Objective:   Physical Exam HENT:     Head:     BP 124/83 (BP Location: Left Arm, Patient Position: Sitting, Cuff Size:  Normal)   Pulse 69   Temp 98.6 F (37 C) (Oral)   Resp 18   Ht 5\' 6"  (1.676 m)   Wt 204 lb 12.8 oz (92.9 kg)   SpO2 100%   BMI 33.06 kg/m  General: Well developed, NAD, BMI noted Neck: No  thyromegaly  HEENT:  Normocephalic . Face symmetric, atraumatic Lungs:  CTA B Normal respiratory effort, no intercostal retractions, no accessory muscle use. Heart: RRR,  no murmur.  No pretibial edema bilaterally  MSK: Shoulders symmetric, range of motion decrease on the right. Abdomen:  Not distended, soft, non-tender. No rebound or rigidity.   Skin: Right-sided abdominal fold which he reports itching is normal to inspection DRE: Normal sphincter tone, no stools, prostate is normal Neurologic:  alert & oriented X3.  Speech normal, gait appropriate for age and unassisted Strength symmetric and appropriate for age.  Psych: Cognition and judgment appear intact.  Cooperative with normal attention span and concentration.  Behavior appropriate. No anxious or depressed appearing.     Assessment     Assessment HTN Hyperlipidemia Hypothyroidism (hypothyroidism  > S/P  radioiodine) Ascending aortic aneurysm DX 2010, 4.5 cm. MRA 08-2017 stable; Dr. Roxan Hockey Vitiligo Acanthosis Nigricans Varicose veins   PLAN: Here for CPX HTN: Seems controlled, recent BMP satisfactory, continue amlodipine, metoprolol. Hyperlipidemia: On Lipitor, check FLP Hypothyroidism: On Synthroid, check a TSH Vitiligo: Occasionally has itching at the scalp the abdominal fold, symptoms decreased with topical steroids, Rx hydrocortisone 2.5% is to use only as needed. Right shoulder pain: Refer to sports medicine Upper abdominal pain: Saw GI, EGD done 07/09/2019, mild gastritis, BX negative/ wnl, neg H. pylori, see GI letter. On Protonix, overall symptoms have decreased, recommend to take Protonix for 4 weeks and then stop. RTC 6 months.   Today, in addition to CPX, I spent more than 22   min with the patient:  >50% of the time counseling regards chronic medical problems including high cholesterol, hypothyroidism.  Also assessing his skin concerns and reviewing the chart regards upper abdominal pain.

## 2019-09-11 NOTE — Assessment & Plan Note (Addendum)
-   had a flu shot today -Declines any other shots -CCS: Normal colonoscopy December 2012 (had external hemorrhoids) -Prostate cancer screening:  +FH , DRE wnl today, check a PSA -Lifestyle: doing well  -Labs:FLP, TSH, PSA

## 2019-09-12 NOTE — Assessment & Plan Note (Signed)
HTN: Seems controlled, recent BMP satisfactory, continue amlodipine, metoprolol. Hyperlipidemia: On Lipitor, check FLP Hypothyroidism: On Synthroid, check a TSH Vitiligo: Occasionally has itching at the scalp the abdominal fold, symptoms decreased with topical steroids, Rx hydrocortisone 2.5% is to use only as needed. Right shoulder pain: Refer to sports medicine Upper abdominal pain: Saw GI, EGD done 07/09/2019, mild gastritis, BX negative/ wnl, neg H. pylori, see GI letter. On Protonix, overall symptoms have decreased, recommend to take Protonix for 4 weeks and then stop. RTC 6 months.

## 2019-09-13 MED ORDER — LEVOTHYROXINE SODIUM 125 MCG PO TABS: 125.0000 ug | ORAL_TABLET | Freq: Every day | ORAL | 2 refills | Status: DC

## 2019-09-13 MED ORDER — AMLODIPINE BESYLATE 10 MG PO TABS: 10.0000 mg | ORAL_TABLET | Freq: Every day | ORAL | 2 refills | Status: DC

## 2019-09-13 MED ORDER — METOPROLOL SUCCINATE ER 100 MG PO TB24: 100.0000 mg | ORAL_TABLET | Freq: Every day | ORAL | 2 refills | Status: DC

## 2019-09-13 MED ORDER — ATORVASTATIN CALCIUM 40 MG PO TABS: 40.0000 mg | ORAL_TABLET | Freq: Every day | ORAL | 2 refills | Status: DC

## 2019-09-13 NOTE — Addendum Note (Signed)
Addended byDamita Dunnings D on: 09/13/2019 05:12 PM   Modules accepted: Orders

## 2019-09-30 ENCOUNTER — Ambulatory Visit: Payer: Self-pay

## 2019-09-30 ENCOUNTER — Ambulatory Visit (INDEPENDENT_AMBULATORY_CARE_PROVIDER_SITE_OTHER): Payer: Medicare HMO | Admitting: Family Medicine

## 2019-09-30 ENCOUNTER — Encounter: Payer: Self-pay | Admitting: Family Medicine

## 2019-09-30 ENCOUNTER — Other Ambulatory Visit: Payer: Self-pay

## 2019-09-30 VITALS — BP 118/79 | HR 72 | Ht 66.0 in | Wt 200.0 lb

## 2019-09-30 DIAGNOSIS — M25511 Pain in right shoulder: Secondary | ICD-10-CM | POA: Diagnosis not present

## 2019-09-30 DIAGNOSIS — G8929 Other chronic pain: Secondary | ICD-10-CM

## 2019-09-30 MED ORDER — PREDNISONE 5 MG PO TABS: ORAL_TABLET | ORAL | 0 refills | Status: DC

## 2019-09-30 NOTE — Progress Notes (Signed)
Dustin Wall - 68 y.o. male MRN [id number]  Date of birth: 12-26-1951  SUBJECTIVE:  Including CC & ROS.  Chief Complaint  Patient presents with  . Shoulder Pain    right shoulder    Dustin Wall is a 68 y.o. male that is presenting with acute on chronic right shoulder pain.  He has a history of a rotator cuff injury several years ago.  He denies any specific event leading to this current pain.  He feels the pain worse at night.  The pain is mild during the day.  It seems to be localized to the shoulder with some radiation down to the elbow.  It can be severe.  Denies any numbness or tingling.  Has tried ibuprofen and other conservative measures with no improvement.  No prior surgery on the shoulder.  He is currently retired.  He used to work out at [employer] but has not since they have been close.   Review of Systems  Constitutional: Negative for fever.  HENT: Negative for congestion.   Respiratory: Negative for cough.   Cardiovascular: Negative for chest pain.  Gastrointestinal: Negative for abdominal pain.  Musculoskeletal: Positive for arthralgias.  Skin: Negative for color change.  Neurological: Negative for weakness.  Hematological: Negative for adenopathy.    HISTORY: Past Medical, Surgical, Social, and Family History Reviewed & Updated per EMR.   Pertinent Historical Findings include:  Past Medical History:  Diagnosis Date  . Ascending aortic aneurysm ([place])    dx 2010 ~ 4.4 cm  . GERD (gastroesophageal reflux disease)   . Hyperlipidemia   . Hypertension   . Hypothyroidism    s/p hyperthyroidism, s/p radioiodine    Past Surgical History:  Procedure Laterality Date  . HEMORRHOID SURGERY      Allergies  Allergen Reactions  . Gadolinium Derivatives Nausea And Vomiting    Pt was given IV Zofran per Dr Kathlene Cote prior to imaging for n/v   Pt states prev n/v from gad at GI from [facility]    Family History  Problem Relation Age of Onset  . Prostate cancer Brother   .  Hypertension Other        several fam members   . Prostate cancer Brother   . Cancer Father   . Colon cancer Neg Hx   . CAD Neg Hx   . Diabetes Neg Hx   . Stroke Neg Hx   . Stomach cancer Neg Hx   . Rectal cancer Neg Hx      Social History   Socioeconomic History  . Marital status: Married    Spouse name: Not on file  . Number of children: 0  . Years of education: Not on file  . Highest education level: Not on file  Occupational History  . Occupation: fully retired at age 58, sanitation  Social Needs  . Financial resource strain: Not on file  . Food insecurity    Worry: Not on file    Inability: Not on file  . Transportation needs    Medical: Not on file    Non-medical: Not on file  Tobacco Use  . Smoking status: Never Smoker  . Smokeless tobacco: Never Used  Substance and Sexual Activity  . Alcohol use: Yes    Comment: beer and wine occasional  . Drug use: No  . Sexual activity: Yes  Lifestyle  . Physical activity    Days per week: Not on file    Minutes per session: Not on file  .  Stress: Not on file  Relationships  . Social Herbalist on phone: Not on file    Gets together: Not on file    Attends religious service: Not on file    Active member of club or organization: Not on file    Attends meetings of clubs or organizations: Not on file    Relationship status: Not on file  . Intimate partner violence    Fear of current or ex partner: Not on file    Emotionally abused: Not on file    Physically abused: Not on file    Forced sexual activity: Not on file  Other Topics Concern  . Not on file  Social History Narrative   Lives w/ wife     PHYSICAL EXAM:  VS: BP 118/79   Pulse 72   Ht 5\' 6"  (1.676 m)   Wt 200 lb (90.7 kg)   BMI 32.28 kg/m  Physical Exam Gen: NAD, alert, cooperative with exam, well-appearing ENT: normal lips, normal nasal mucosa,  Eye: normal EOM, normal conjunctiva and lids CV:  no edema, +2 pedal pulses   Resp: no  accessory muscle use, non-labored,  Skin: no rashes, no areas of induration  Neuro: normal tone, normal sensation to touch Psych:  normal insight, alert and oriented MSK:  Right shoulder: No tenderness to palpation over the acromium or AC joint. Normal internal and external rotation. Normal strength resistance. Pain with external rotation and abduction. Pain with empty can testing. Negative O'Brien's test. Negative speeds test. Neurovascular intact  Limited ultrasound: Right shoulder:  Normal-appearing biceps tendon in long and short axis. Normal-appearing subscapularis. There appears to be a mild effusion stemming from the joint space superficial to the subscapularis. Calcific tendinitis at the insertion of the supraspinatus.  No vascular uptake in this area.  No impingement noted on dynamic testing. Enlargement of the capsule in the posterior glenohumeral joint.  Summary: Findings suggestive of capsulitis versus related to degenerative changes.  Ultrasound and interpretation by Clearance Coots, MD      ASSESSMENT & PLAN:   Chronic right shoulder pain Symptoms seem more related to capsulitis versus degenerative changes of the glenohumeral joint.  Mild effusion noted on ultrasound.  Does have calcific changes of the supraspinatus but does not seem the main origin of his pain. -Prednisone. -Counseled on home exercise therapy and supportive care. -Referral to physical therapy. -If no improvement can consider x-ray and glenohumeral injection.

## 2019-09-30 NOTE — Assessment & Plan Note (Signed)
Symptoms seem more related to capsulitis versus degenerative changes of the glenohumeral joint.  Mild effusion noted on ultrasound.  Does have calcific changes of the supraspinatus but does not seem the main origin of his pain. -Prednisone. -Counseled on home exercise therapy and supportive care. -Referral to physical therapy. -If no improvement can consider x-ray and glenohumeral injection.

## 2019-09-30 NOTE — Patient Instructions (Signed)
Nice to meet you Please try the exercises  Please try ice  Please try tylenol  You will get a call about physical therapy.   Please send me a message in MyChart with any questions or updates.  Please see me back in 4 weeks or sooner if needed.   --Dr. Raeford Razor

## 2019-10-10 ENCOUNTER — Other Ambulatory Visit: Payer: Self-pay | Admitting: Thoracic Surgery (Cardiothoracic Vascular Surgery)

## 2019-10-10 ENCOUNTER — Ambulatory Visit
Admission: RE | Admit: 2019-10-10 | Discharge: 2019-10-10 | Disposition: A | Discharge status: Home / Self Care | Payer: Medicare HMO | Source: Ambulatory Visit | Attending: Thoracic Surgery (Cardiothoracic Vascular Surgery) | Admitting: Thoracic Surgery (Cardiothoracic Vascular Surgery)

## 2019-10-10 DIAGNOSIS — I712 Thoracic aortic aneurysm, without rupture, unspecified: Secondary | ICD-10-CM

## 2019-10-11 ENCOUNTER — Other Ambulatory Visit: Payer: Self-pay

## 2019-10-11 ENCOUNTER — Telehealth: Payer: Self-pay | Admitting: Internal Medicine

## 2019-10-11 ENCOUNTER — Ambulatory Visit: Payer: Medicare HMO | Attending: Family Medicine | Admitting: Physical Therapy

## 2019-10-11 ENCOUNTER — Encounter: Payer: Self-pay | Admitting: Physical Therapy

## 2019-10-11 DIAGNOSIS — M25611 Stiffness of right shoulder, not elsewhere classified: Secondary | ICD-10-CM | POA: Diagnosis not present

## 2019-10-11 DIAGNOSIS — M25512 Pain in left shoulder: Secondary | ICD-10-CM | POA: Diagnosis not present

## 2019-10-11 DIAGNOSIS — M6281 Muscle weakness (generalized): Secondary | ICD-10-CM | POA: Insufficient documentation

## 2019-10-11 DIAGNOSIS — R293 Abnormal posture: Secondary | ICD-10-CM

## 2019-10-11 DIAGNOSIS — G8929 Other chronic pain: Secondary | ICD-10-CM | POA: Diagnosis not present

## 2019-10-11 DIAGNOSIS — M25612 Stiffness of left shoulder, not elsewhere classified: Secondary | ICD-10-CM | POA: Diagnosis not present

## 2019-10-11 DIAGNOSIS — M25511 Pain in right shoulder: Secondary | ICD-10-CM | POA: Diagnosis not present

## 2019-10-11 MED ORDER — FLUTICASONE PROPIONATE 0.005 % EX OINT: 1.0000 "application " | TOPICAL_OINTMENT | Freq: Two times a day (BID) | CUTANEOUS | 0 refills | Status: DC

## 2019-10-11 NOTE — Telephone Encounter (Signed)
Please advise 

## 2019-10-11 NOTE — Telephone Encounter (Signed)
Spoke w/ Pt- informed of recommendations. Pt verbalized understanding.  

## 2019-10-11 NOTE — Telephone Encounter (Signed)
Pt came by office and stated he and Dr. Larose Kells had discussed an issue (rash) on the L side of his head that he has had a problem with at his last visit.  At his last visit, he stated Dr. Larose Kells wrote him a script for itching and it works for the "itching" but he still would like a script for Fluticasone Propionate Ointment , 0.005 because it will heal the issue%.  I did mention to the pt  this script was last filled by Dr. Danella Sensing, Dermatologist, but he stated Dr. Larose Kells had filled this previously for him as well. If Dr. Larose Kells can fill this script, pt would like it sent to [facility] on [facility].

## 2019-10-11 NOTE — Telephone Encounter (Signed)
Advise patient, I sent the prescription for fluticasone, stop hydrocortisone (the cream I prescribed). If problem persist, may need to see Dr. Ronnald Ramp again

## 2019-10-11 NOTE — Patient Instructions (Signed)
    Home exercise program created by Roann Merk, PT.  For questions, please contact Doye Montilla via phone at [phone] or email at [email or web address]  [facility] [place] [address]  [address] [place], [place], [address] Phone: [phone]   Fax:  [phone]    

## 2019-10-11 NOTE — Therapy (Signed)
[facility] [place] [address]  [place], [place], [address] Phone: [phone]   Fax:  [phone]  Physical Therapy Evaluation  Patient Details  Name: Dustin Wall MRN: [id number] Date of Birth: [birth year] Referring Provider (PT): Clearance Coots, MD   Encounter Date: 10/11/2019  PT End of Session - 10/11/19 1005    Visit Number  1    Number of Visits  16    Date for PT Re-Evaluation  12/06/19    Authorization Type  [employer]    PT Start Time  1005    PT Stop Time  1108    PT Time Calculation (min)  63 min    Activity Tolerance  Patient tolerated treatment well;Patient limited by pain    Behavior During Therapy  [facility] for tasks assessed/performed;Impulsive       Past Medical History:  Diagnosis Date  . Ascending aortic aneurysm ([place])    dx 2010 ~ 4.4 cm  . GERD (gastroesophageal reflux disease)   . Hyperlipidemia   . Hypertension   . Hypothyroidism    s/p hyperthyroidism, s/p radioiodine    Past Surgical History:  Procedure Laterality Date  . HEMORRHOID SURGERY      There were no vitals filed for this visit.   Subjective Assessment - 10/11/19 1007    Subjective  Pt reports R shoulder pain for "a couple months" with new onset of L shoulder pain following his flu shot a few weeks ago. Reports remote h/o R shoulder RTC ~30 yrs ago with therapy at the time. Pain present all the time, worsening with nearly all motions of shoulders and up to 10/10 at night making sleep difficult. Prescribed exercises by MD but states these also make pain worse.    Pertinent History  remote h/o R RTC injury ~30 yrs ago; HTN, ascending aortic aneurysm, GERD    Limitations  House hold activities    Diagnostic tests  09/30/19 - R shoulder [place]: Findings suggestive of capsulitis versus related to degenerative changes.    Patient Stated Goals  "to eliminate this pain and to heal"    Currently in Pain?  Yes    Pain Score  6    up to  10/10 at worst, esp at night   Pain Location  Shoulder    Pain Orientation  Right;Upper    Pain Descriptors / Indicators  Sharp;Stabbing    Pain Type  Acute pain;Chronic pain    Pain Radiating Towards  into R upper arm    Pain Onset  More than a month ago   "a few months"   Pain Frequency  Constant    Aggravating Factors   all motions of shoulder, esp reaching up; laying/sleeping in bed (all positions)    Pain Relieving Factors  nothing    Effect of Pain on Daily Activities  disrupts sleep; ADLs; limits household upkeep - has been trying to paint house but limited by pain    Pain Score  6   up to 10/10 at worst, esp at night   Pain Location  Shoulder    Pain Orientation  Left;Upper    Pain Descriptors / Indicators  Sharp;Stabbing    Pain Type  Acute pain    Pain Radiating Towards  into L anterior upper arm    Pain Onset  1 to 4 weeks ago   after flu shot   Pain Frequency  Constant    Aggravating Factors   all motions of  shoulder, esp reaching up; laying/sleeping in bed (all positions)    Pain Relieving Factors  nothing    Effect of Pain on Daily Activities  disrupts sleep; ADLs; limits household upkeep - has been trying to paint house but limited by pain         [facility] PT Assessment - 10/11/19 1005      Assessment   Medical Diagnosis  B shoulder pain - R chronic, L acute    Referring Provider (PT)  Clearance Coots, MD    Onset Date/Surgical Date  --   R shoulder - a few months; L shoulder ~2 wks   Hand Dominance  Right   somewhat ambidextrous   Next MD Visit  10/28/19    Prior Therapy  remote h/o PT for R shoulder RTC injury      Precautions   Precautions  None      Balance Screen   Has the patient fallen in the past 6 months  No    Has the patient had a decrease in activity level because of a fear of falling?   No    Is the patient reluctant to leave their home because of a fear of falling?   No      Home Film/video editor residence      Prior  Function   Level of Independence  Independent    Vocation  Retired    Leisure  Marketing executive, basketball; fixing up things around the house; gym 3x/wk before COVID-19 (cardio & weights)      Observation/Other Assessments   Focus on Therapeutic Outcomes (FOTO)   Shoulder - 47% (53% limitation); Predicted 67% (33% limitation)       Posture/Postural Control   Posture/Postural Control  Postural limitations    Postural Limitations  Forward head;Rounded Shoulders      ROM / Strength   AROM / PROM / Strength  AROM;PROM;Strength      AROM   AROM Assessment Site  Shoulder    Right/Left Shoulder  Right;Left    Right Shoulder Extension  20 Degrees    Right Shoulder Flexion  121 Degrees    Right Shoulder ABduction  75 Degrees    Right Shoulder Internal Rotation  --   FIR to L3   Right Shoulder External Rotation  --   FER to T2   Left Shoulder Extension  20 Degrees    Left Shoulder Flexion  116 Degrees    Left Shoulder ABduction  73 Degrees    Left Shoulder Internal Rotation  --   FIR to L3   Left Shoulder External Rotation  --   FER to T1     PROM   PROM Assessment Site  Shoulder    Right/Left Shoulder  Right;Left    Right Shoulder Flexion  144 Degrees    Right Shoulder ABduction  131 Degrees    Right Shoulder Internal Rotation  78 Degrees    Right Shoulder External Rotation  88 Degrees    Left Shoulder Flexion  148 Degrees    Left Shoulder ABduction  150 Degrees    Left Shoulder Internal Rotation  74 Degrees    Left Shoulder External Rotation  80 Degrees      Strength   Strength Assessment Site  Shoulder    Right/Left Shoulder  Right;Left    Right Shoulder Flexion  4/5    Right Shoulder Extension  4-/5    Right Shoulder ABduction  4/5  Right Shoulder Internal Rotation  4/5    Right Shoulder External Rotation  4-/5    Left Shoulder Flexion  4/5    Left Shoulder Extension  4/5    Left Shoulder ABduction  4/5    Left Shoulder Internal Rotation  4/5    Left Shoulder  External Rotation  4-/5      Palpation   Palpation comment  ttp t/o B UT, deltoids, lateral pecs & biceps; denies ttp in periscapular muscles                Objective measurements completed on examination: See above findings.      [facility] Adult PT Treatment/Exercise - 10/11/19 1005      Self-Care   Self-Care  Heat/Ice Application    Heat/Ice Application  Answered pt's questions re: use of ice vs heat for pain management.      Exercises   Exercises  Shoulder      Shoulder Exercises: Supine   Flexion  AAROM;Both    Flexion Limitations  attempted wand AAROM but pain worsening >90 dg      Shoulder Exercises: Seated   Retraction  Both;10 reps    Retraction Limitations  retraction + depression with 5" hold      Shoulder Exercises: ROM/Strengthening   Pendulum  B shoulder - flex/ext, horiz abd/add, CW/CCW circles - 2 x 30 sec each      Shoulder Exercises: Stretch   Corner Stretch Limitations  attempted low doorway stretch but deferred d/t increased pain    Table Stretch -Flexion Limitations  B AAROM - 10 x 3"    Table Stretch - ABduction Limitations  B AAROM - 10 x 3"    Other Shoulder Stretches  B shoulder table stretch/AAROM scaption 10 x 3"             PT Education - 10/11/19 1108    Education Details  PT eval findings, anticipated POC & initial HEP - patient instruced to defer MD prescribed exercises until able to review these exercises on next PT visit d/t reports of increased pain    Person(s) Educated  Patient    Methods  Explanation;Demonstration;Verbal cues;Handout    Comprehension  Verbalized understanding;Returned demonstration;Verbal cues required;Need further instruction       PT Short Term Goals - 10/11/19 1108      PT SHORT TERM GOAL #1   Title  Patient will be independent with initial HEP    Status  New    Target Date  11/01/19      PT SHORT TERM GOAL #2   Title  Patient to report pain reduction in frequency and intensity by >/= 50%     Status  New    Target Date  11/08/19        PT Long Term Goals - 10/11/19 1108      PT LONG TERM GOAL #1   Title  Patient will be independent with ongoing/advanced HEP    Status  New    Target Date  12/06/19      PT LONG TERM GOAL #2   Title  Patient to demonstrate appropriate posture and body mechanics needed for daily activities    Status  New    Target Date  12/06/19      PT LONG TERM GOAL #3   Title  B shoulder AROM WFL w/o pain provocation    Status  New    Target Date  12/06/19      PT LONG  TERM GOAL #4   Title  B shoulder strength >/= 4+/5 w/o pain on resistance    Status  New    Target Date  12/06/19      PT LONG TERM GOAL #5   Title  Patient to report ability to perform ADLs, household and work-related tasks without increased shoulder pain    Status  New    Target Date  12/06/19             Plan - 10/11/19 1108    Clinical Impression Statement  Dustin Wall is a 68 y/o male who presents to OP PT for B shoulder pain - acute on chronic in R shoulder and acute pain of L shoulder with MD indicating symptoms seem more related to capsulitis versus degenerative changes of the glenohumeral joint. Patient reports remote h/o R RTC injury ~30 yrs ago but no known trigger for recent R shoulder pain originating 2-3 months ago, however he attributes onset of L shoulder pain ~2 weeks ago to flu vaccine administered on 09/11/19. B shoulder AROM limited by pain in all planes with PROM closer to full ROM with less pain reported. Mild B shoulder strength deficits also present with increased pain reported on manual resistance. Pain and LOM limit self-care ADLs including bathing, grooming and dressing as well as functional tasks, such as carrying heavy objects, reaching or lifting to overhead cabinets or painting his house. Dustin Wall will benefit from skilled PT to address ROM, strength and postural deficits to decreased pain with and restore functional use of B UE with daily tasks.     Personal Factors and Comorbidities  Time since onset of injury/illness/exacerbation;Past/Current Experience;Comorbidity 3+    Comorbidities  remote h/o R RTC injury ~30 yrs ago; HTN, ascending aortic aneurysm, GERD    Examination-Activity Limitations  Bathing;Hygiene/Grooming;Caring for Others;Carry;Lift;Reach Overhead    Examination-Participation Restrictions  Community Activity;Yard Work    Merchant navy officer  Evolving/Moderate complexity    Clinical Decision Making  Moderate    Rehab Potential  Good    PT Frequency  2x / week    PT Duration  8 weeks    PT Treatment/Interventions  ADLs/Self Care Home Management;Cryotherapy;Electrical Stimulation;Iontophoresis 4mg /ml Dexamethasone;Moist Heat;Ultrasound;Functional mobility training;Therapeutic activities;Therapeutic exercise;Neuromuscular re-education;Patient/family education;Manual techniques;Passive range of motion;Dry needling;Taping;Vasopneumatic Device;Joint Manipulations    PT Next Visit Plan  Review initial HEP and assess MD prescribed exercises for proper technique; gentle P/AAROM and stretching; scapular & RTC strengthening as tolerated; manual therapy and modalities PRN    PT Home Exercise Plan  10/11/19 - pendulum exercises, AAROM table slides, scap retraction    Consulted and Agree with Plan of Care  Patient       Patient will benefit from skilled therapeutic intervention in order to improve the following deficits and impairments:  Pain, Postural dysfunction, Improper body mechanics, Decreased activity tolerance, Decreased endurance, Decreased mobility, Decreased range of motion, Decreased strength, Increased muscle spasms, Impaired perceived functional ability, Impaired UE functional use  Visit Diagnosis: Chronic right shoulder pain  Acute pain of left shoulder  Stiffness of right shoulder, not elsewhere classified  Stiffness of left shoulder, not elsewhere classified  Abnormal posture  Muscle weakness  (generalized)     Problem List Patient Active Problem List   Diagnosis Date Noted  . Chronic right shoulder pain 09/30/2019  . PCP NOTE >>>>>>>>>>>>>>>>>>>>>> 04/19/2016  . Vitiligo 04/19/2016  . Acanthosis nigricans 04/19/2016  . Abdominal pain, epigastric 10/27/2014  . Prostatitis 10/02/2014  . Ascending aortic aneurysm ([place])   . Annual  physical exam 03/19/2014  . Hyperlipidemia   . Hypothyroidism   . Hypertension     Percival Spanish, PT, MPT 10/11/2019, 12:04 PM  [facility] [address]  [place], [place], [address] Phone: [phone]   Fax:  [phone]  Name: Dustin Wall MRN: [id number] Date of Birth: 05/02/1951

## 2019-10-15 ENCOUNTER — Other Ambulatory Visit: Payer: Self-pay

## 2019-10-15 ENCOUNTER — Ambulatory Visit (INDEPENDENT_AMBULATORY_CARE_PROVIDER_SITE_OTHER): Payer: Medicare HMO | Admitting: Thoracic Surgery (Cardiothoracic Vascular Surgery)

## 2019-10-15 ENCOUNTER — Encounter: Payer: Self-pay | Admitting: Thoracic Surgery (Cardiothoracic Vascular Surgery)

## 2019-10-15 VITALS — BP 111/75 | HR 70 | Temp 97.5°F | Resp 16 | Ht 66.0 in | Wt 199.0 lb

## 2019-10-15 DIAGNOSIS — I7121 Aneurysm of the ascending aorta, without rupture: Secondary | ICD-10-CM

## 2019-10-15 DIAGNOSIS — I712 Thoracic aortic aneurysm, without rupture: Secondary | ICD-10-CM | POA: Diagnosis not present

## 2019-10-15 NOTE — Progress Notes (Signed)
[address]       [place] [address]             [phone]     HPI: [name] returns for a scheduled follow-up regarding an ascending aneurysm  Dustin Wall is a 68 year old man with a past medical history significant for hypertension, hyperlipidemia, ascending aneurysm, hypothyroidism following radioactive iodine, and arthritis.  He was first found to have an ascending aneurysm in 2010.  It was about 4.4 cm at that time.  There has been slight growth with it measuring 4.6 cm a year ago.  He feels well.  He is not having any chest pain, pressure, tightness, or shortness of breath.  He has been going through some physical therapy for some inflammation in his right shoulder.  He got a flu shot on the left shoulder a couple weeks ago and still has some soreness from that.  Past Medical History:  Diagnosis Date  . Ascending aortic aneurysm ([place])    dx 2010 ~ 4.4 cm  . GERD (gastroesophageal reflux disease)   . Hyperlipidemia   . Hypertension   . Hypothyroidism    s/p hyperthyroidism, s/p radioiodine    Current Outpatient Medications  Medication Sig Dispense Refill  . amLODipine (NORVASC) 10 MG tablet Take 1 tablet (10 mg total) by mouth daily. 90 tablet 2  . atorvastatin (LIPITOR) 40 MG tablet Take 1 tablet (40 mg total) by mouth at bedtime. 90 tablet 2  . fluticasone (CUTIVATE) 0.005 % ointment Apply 1 application topically 2 (two) times daily. 30 g 0  . levothyroxine (SYNTHROID) 125 MCG tablet Take 1 tablet (125 mcg total) by mouth daily before breakfast. 90 tablet 2  . metoprolol succinate (TOPROL-XL) 100 MG 24 hr tablet Take 1 tablet (100 mg total) by mouth daily. Take with or immediately following a meal. 90 tablet 2  . pantoprazole (PROTONIX) 40 MG tablet Take 1 tablet (40 mg total) by mouth daily before breakfast. 90 tablet 3   No current facility-administered medications for this visit.     Physical Exam BP 111/75 (BP Location: Left Arm, Patient  Position: Sitting, Cuff Size: Normal)   Pulse 70   Temp (!) 97.5 F (36.4 C)   Resp 16   Ht 5\' 6"  (1.676 m)   Wt 199 lb (90.3 kg)   SpO2 95% Comment: RA  BMI 32.84 kg/m  68 year old man in no acute distress Alert and oriented x3 with no focal deficits No carotid bruits Cardiac regular rate and rhythm normal S1 and S2, no murmur Lungs clear with equal breath sounds bilaterally Pulses intact, no edema  Diagnostic Tests: MRA CHEST WITH OR WITHOUT CONTRAST  TECHNIQUE: Angiographic images of the chest were obtained using MRA technique without intravenous contrast (patient refused).  CONTRAST:  Patient refused  COMPARISON:  10/06/2018 and previous  FINDINGS: VASCULAR  Aorta: No dissection or stenosis. Maximum transverse dimensions as follows:  4.4 cm sinuses of Valsalva  4.2 cm sino-tubular junction  4.6 cm mid ascending (stable)  3.7 cm distal ascending/proximal arch  3.0 cm distal arch  3.3cm proximal descending  2.7 cm distal descending  Bovine variant brachiocephalic arterial origin anatomy without proximal stenosis.  Heart: Unremarkable. No pericardial effusion.  Pulmonary Arteries: Unremarkable centrally, limited evaluation of distal branches.  Other: None  NON-VASCULAR  Spinal cord: Negative limited evaluation  Brachial plexus: Limited evaluation, no evidence of mass  Muscles and tendons: Negative limited evaluation  Bones:  Negative limited evaluation  Joints: Incomplete evaluation  IMPRESSION: 1. Stable 4.6 cm ascending thoracic aortic aneurysm without complicating features. Recommend semi-annual imaging followup by CTA or MRA and referral to cardiothoracic surgery if not already obtained. This recommendation follows 2010 ACCF/AHA/AATS/ACR/ASA/SCA/SCAI/SIR/STS/SVM Guidelines for the Diagnosis and Management of Patients With Thoracic Aortic Disease. Circulation. 2010; [id number]   Electronically Signed    By: Lucrezia Europe M.D.   On: 10/10/2019 12:09 I personally reviewed the MR images and concur with the findings noted above  Impression: Dustin Wall is a 68 year old man with a past medical history significant for hypertension, hyperlipidemia, ascending aneurysm, hypothyroidism following radioactive iodine, and arthritis.   Ascending aneurysm-first noted in 2010.  Was 4.4 cm at that time.  More recently the last 2 scans of shown 4.6 cm.  I did explain to him that the recommendation with semiannual follow-up for aneurysms greater than 4.5 cm.  He is willing to go along with that if insurance will approve his scan.  Hypertension-blood pressure well controlled  Hyperlipidemia-on Lipitor.  Followed by Dr. Larose Kells  Plan: Return in 6 months with MR angio of chest  Melrose Nakayama, MD [facility] [phone]

## 2019-10-17 ENCOUNTER — Other Ambulatory Visit: Payer: Self-pay

## 2019-10-17 ENCOUNTER — Ambulatory Visit: Payer: Medicare HMO | Admitting: Physical Therapy

## 2019-10-17 ENCOUNTER — Encounter: Payer: Self-pay | Admitting: Physical Therapy

## 2019-10-17 DIAGNOSIS — M6281 Muscle weakness (generalized): Secondary | ICD-10-CM | POA: Diagnosis not present

## 2019-10-17 DIAGNOSIS — R293 Abnormal posture: Secondary | ICD-10-CM

## 2019-10-17 DIAGNOSIS — M25611 Stiffness of right shoulder, not elsewhere classified: Secondary | ICD-10-CM

## 2019-10-17 DIAGNOSIS — M25511 Pain in right shoulder: Secondary | ICD-10-CM

## 2019-10-17 DIAGNOSIS — M25512 Pain in left shoulder: Secondary | ICD-10-CM

## 2019-10-17 DIAGNOSIS — G8929 Other chronic pain: Secondary | ICD-10-CM

## 2019-10-17 DIAGNOSIS — M25612 Stiffness of left shoulder, not elsewhere classified: Secondary | ICD-10-CM | POA: Diagnosis not present

## 2019-10-17 NOTE — Therapy (Addendum)
[facility] [address]  [place], [place], [address] Phone: [phone]   Fax:  [phone]  Physical Therapy Treatment / Discharge Summary  Patient Details  Name: Dustin Wall MRN: [id number] Date of Birth: 1951-06-21 Referring Provider (PT): Clearance Coots, MD   Encounter Date: 10/17/2019  PT End of Session - 10/17/19 1402    Visit Number  2    Number of Visits  16    Date for PT Re-Evaluation  12/06/19    Authorization Type  [employer] Medicare    PT Start Time  6962    PT Stop Time  1436    PT Time Calculation (min)  34 min    Activity Tolerance  Patient tolerated treatment well    Behavior During Therapy  [facility] for tasks assessed/performed;Impulsive       Past Medical History:  Diagnosis Date  . Ascending aortic aneurysm ([place])    dx 2010 ~ 4.4 cm  . GERD (gastroesophageal reflux disease)   . Hyperlipidemia   . Hypertension   . Hypothyroidism    s/p hyperthyroidism, s/p radioiodine    Past Surgical History:  Procedure Laterality Date  . HEMORRHOID SURGERY      There were no vitals filed for this visit.  Subjective Assessment - 10/17/19 1407    Subjective  Pt reporting pain much better today - feels like HEP is helping. States he is able to sleep much better.    Pertinent History  remote h/o R RTC injury ~30 yrs ago; HTN, ascending aortic aneurysm, GERD    Diagnostic tests  09/30/19 - R shoulder [place]: Findings suggestive of capsulitis versus related to degenerative changes.    Patient Stated Goals  "to eliminate this pain and to heal"    Currently in Pain?  Yes    Pain Score  3     Pain Location  Shoulder    Pain Orientation  Right    Pain Descriptors / Indicators  Dull    Pain Type  Acute pain;Chronic pain    Pain Frequency  Intermittent    Pain Score  3    Pain Location  Shoulder    Pain Orientation  Left    Pain Descriptors / Indicators  Dull    Pain Type  Acute pain    Pain Frequency   Intermittent         [facility] PT Assessment - 10/17/19 1402      Assessment   Medical Diagnosis  B shoulder pain - R chronic, L acute    Referring Provider (PT)  Clearance Coots, MD    Onset Date/Surgical Date  --   R shoulder - a few months; L shoulder ~2 wks   Hand Dominance  Right   somewhat ambidextrous   Next MD Visit  10/28/19      Observation/Other Assessments   Focus on Therapeutic Outcomes (FOTO)   Shoulder - 92% (8% limitation)       AROM   Right Shoulder Flexion  146 Degrees    Right Shoulder ABduction  161 Degrees    Right Shoulder Internal Rotation  --   FIR to L3   Right Shoulder External Rotation  --   FER to T2   Left Shoulder Flexion  144 Degrees    Left Shoulder ABduction  162 Degrees    Left Shoulder Internal Rotation  --   FIR to L3   Left Shoulder External Rotation  --   FER  to T1     Strength   Right Shoulder Flexion  4+/5    Right Shoulder Extension  4+/5    Right Shoulder ABduction  4+/5    Right Shoulder Internal Rotation  5/5    Right Shoulder External Rotation  4+/5    Left Shoulder Flexion  4+/5    Left Shoulder Extension  4+/5    Left Shoulder ABduction  4+/5    Left Shoulder Internal Rotation  5/5    Left Shoulder External Rotation  4+/5                   [facility] Adult PT Treatment/Exercise - 10/17/19 1402      Exercises   Exercises  Shoulder      Shoulder Exercises: Standing   Horizontal ABduction  Both;10 reps;Theraband;Strengthening    Theraband Level (Shoulder Horizontal ABduction)  Level 1 (Yellow)    External Rotation  Right;Left;10 reps;Theraband;Strengthening    Theraband Level (Shoulder External Rotation)  Level 1 (Yellow)    External Rotation Limitations  heavy cues to maintain neutral shoulder - better after placing towel under elbow    Internal Rotation  Right;Left;10 reps;Theraband;Strengthening    Theraband Level (Shoulder Internal Rotation)  Level 1 (Yellow)    Flexion  Both;10 reps;Theraband;Strengthening     Theraband Level (Shoulder Flexion)  Level 1 (Yellow)    Extension  Both;10 reps;Theraband;Strengthening    Theraband Level (Shoulder Extension)  Level 1 (Yellow)    Extension Limitations  cues for scap retraction    Row  Both;10 reps;Theraband;Strengthening    Theraband Level (Shoulder Row)  Level 1 (Yellow)    Row Limitations  cues to pull elbows into ribs & avoiding upward rotation of elbows      Shoulder Exercises: ROM/Strengthening   UBE (Upper Arm Bike)  L2.0 x 6 min (3' fwd/3" back)    Pendulum  B shoulder - flex/ext, horiz abd/add, CW/CCW circles - 2 x 30 sec each               PT Short Term Goals - 10/17/19 1410      PT SHORT TERM GOAL #1   Title  Patient will be independent with initial HEP    Status  Achieved    Target Date  11/01/19      PT SHORT TERM GOAL #2   Title  Patient to report pain reduction in frequency and intensity by >/= 50%    Status  Achieved    Target Date  11/08/19        PT Long Term Goals - 10/17/19 1411      PT LONG TERM GOAL #1   Title  Patient will be independent with ongoing/advanced HEP    Status  Achieved    Target Date  12/06/19      PT LONG TERM GOAL #2   Title  Patient to demonstrate appropriate posture and body mechanics needed for daily activities    Status  Deferred    Target Date  12/06/19      PT LONG TERM GOAL #3   Title  B shoulder AROM WFL w/o pain provocation    Status  Achieved    Target Date  12/06/19      PT LONG TERM GOAL #4   Title  B shoulder strength >/= 4+/5 w/o pain on resistance    Status  Achieved    Target Date  12/06/19      PT LONG TERM GOAL #5  Title  Patient to report ability to perform ADLs, household and work-related tasks without increased shoulder pain    Status  Achieved    Target Date  12/06/19            Plan - 10/17/19 1411    Clinical Impression Statement  Dustin Wall returning for first treatment visit since eval reporting pain much better and he is "feeling like his old  self". Reports he has been able to do all the exercises prescribed by PT and MD w/o increased pain and notes no limitations with normal daily tasks. PT & MD HEPs reviewed with patient requiring minor corrections of positioning and technique but otherwise able to demonstrate all exercises appropriately w/o pain reported. Patient expressing desire to make this his last PT given degree of improvement, therefore reassessment ROM and strength with patient able to demonstrate full AROM and 4+/5 to 5/5 strength without pain reported. He reports no further limitation with daily activities and feels confident continuing with MD and PT prescribed HEPs. All goals met with exception of posture and body mechanics goal deferred due to resolution of symptoms. Given significantly shortened POC, patient will be placed on hold for 30-days in the event that pain or other symptoms recur.    Personal Factors and Comorbidities  Time since onset of injury/illness/exacerbation;Past/Current Experience;Comorbidity 3+    Comorbidities  remote h/o R RTC injury ~30 yrs ago; HTN, ascending aortic aneurysm, GERD    Rehab Potential  Good    PT Frequency  2x / week    PT Duration  8 weeks    PT Treatment/Interventions  ADLs/Self Care Home Management;Cryotherapy;Electrical Stimulation;Iontophoresis 59m/ml Dexamethasone;Moist Heat;Ultrasound;Functional mobility training;Therapeutic activities;Therapeutic exercise;Neuromuscular re-education;Patient/family education;Manual techniques;Passive range of motion;Dry needling;Taping;Vasopneumatic Device;Joint Manipulations    PT Next Visit Plan  30-day hold    PT Home Exercise Plan  10/11/19 - pendulum exercises, AAROM table slides, scap retraction    Consulted and Agree with Plan of Care  Patient       Patient will benefit from skilled therapeutic intervention in order to improve the following deficits and impairments:  Pain, Postural dysfunction, Improper body mechanics, Decreased activity  tolerance, Decreased endurance, Decreased mobility, Decreased range of motion, Decreased strength, Increased muscle spasms, Impaired perceived functional ability, Impaired UE functional use  Visit Diagnosis: Chronic right shoulder pain  Acute pain of left shoulder  Stiffness of right shoulder, not elsewhere classified  Stiffness of left shoulder, not elsewhere classified  Abnormal posture  Muscle weakness (generalized)     Problem List Patient Active Problem List   Diagnosis Date Noted  . Chronic right shoulder pain 09/30/2019  . PCP NOTE >>>>>>>>>>>>>>>>>>>>>> 04/19/2016  . Vitiligo 04/19/2016  . Acanthosis nigricans 04/19/2016  . Abdominal pain, epigastric 10/27/2014  . Prostatitis 10/02/2014  . Ascending aortic aneurysm ([place]   . Annual physical exam 03/19/2014  . Hyperlipidemia   . Hypothyroidism   . Hypertension     JPercival Spanish PT, MPT 10/17/2019, 6:10 PM  [facility] [place] [address]Phone: [phone]  Fax:  [phone] Name: Dustin TomaszewskiMRN: [id number] of Birth: [birth year] PHYSICAL THERAPY DISCHARGE SUMMARY  Visits from Start of Care: 2  Current functional level related to goals / functional outcomes:   Refer to above clinical impression for status as of last visit on 10/17/2019. Patient was placed on hold for 30 days and has not needed to return to PT, therefore will  proceed with discharge from PT for this episode.   Remaining deficits:   As above.    Education / Equipment:   HEP  Plan: Patient agrees to discharge.  Patient goals were met. Patient is being discharged due to being pleased with the current functional level.  ?????     Percival Spanish, PT, MPT 11/18/19, 9:16 AM  [facility] [address]  [place], [place], [address] Phone: [phone]   Fax:  [phone]

## 2019-10-28 ENCOUNTER — Ambulatory Visit: Payer: Self-pay

## 2019-10-28 ENCOUNTER — Ambulatory Visit (INDEPENDENT_AMBULATORY_CARE_PROVIDER_SITE_OTHER): Payer: Medicare HMO | Admitting: Family Medicine

## 2019-10-28 ENCOUNTER — Other Ambulatory Visit: Payer: Self-pay

## 2019-10-28 VITALS — BP 120/75 | HR 69 | Ht 66.0 in | Wt 198.0 lb

## 2019-10-28 DIAGNOSIS — M25511 Pain in right shoulder: Secondary | ICD-10-CM

## 2019-10-28 DIAGNOSIS — M25512 Pain in left shoulder: Secondary | ICD-10-CM | POA: Diagnosis not present

## 2019-10-28 DIAGNOSIS — G8929 Other chronic pain: Secondary | ICD-10-CM

## 2019-10-28 MED ORDER — TRIAMCINOLONE ACETONIDE 40 MG/ML IJ SUSP
40.0000 mg | Freq: Once | INTRAMUSCULAR | Status: AC
  Administered 2019-10-28: 11:00:00 40 mg via INTRA_ARTICULAR

## 2019-10-28 NOTE — Assessment & Plan Note (Signed)
Pain at night is his main concern.  Seems less rotator cuff and more joint related. -Glenohumeral injection. -Counseled on home exercise therapy and supportive care. -Could consider subacromial injection and get imaging.

## 2019-10-28 NOTE — Progress Notes (Signed)
Dustin Wall - 68 y.o. male MRN [id number]  Date of birth: 09-07-1951  SUBJECTIVE:  Including CC & ROS.  No chief complaint on file.   Dustin Wall is a 68 y.o. male that is presenting with right and left shoulder pain.  He has been through physical therapy and noticed some improvement initially.  Now he has ongoing night pain.  This is occurring in both shoulders.  It will wake him up from his sleep.  The pain is localized to the shoulder.  Denies any pain through the course the day.  Does experience mild pain when he reaches above his head.  No radicular symptoms.  Has not tried any medications.  Denies any numbness or tingling.    Review of Systems  Constitutional: Negative for fever.  HENT: Negative for congestion.   Respiratory: Negative for cough.   Cardiovascular: Negative for chest pain.  Gastrointestinal: Negative for abdominal pain.  Musculoskeletal: Positive for arthralgias.  Skin: Negative for color change.  Neurological: Negative for weakness.  Hematological: Negative for adenopathy.    HISTORY: Past Medical, Surgical, Social, and Family History Reviewed & Updated per EMR.   Pertinent Historical Findings include:  Past Medical History:  Diagnosis Date  . Ascending aortic aneurysm ([place])    dx 2010 ~ 4.4 cm  . GERD (gastroesophageal reflux disease)   . Hyperlipidemia   . Hypertension   . Hypothyroidism    s/p hyperthyroidism, s/p radioiodine    Past Surgical History:  Procedure Laterality Date  . HEMORRHOID SURGERY      Allergies  Allergen Reactions  . Gadolinium Derivatives Nausea And Vomiting    Pt was given IV Zofran per Dr Kathlene Cote prior to imaging for n/v   Pt states prev n/v from gad at GI from MRA    Family History  Problem Relation Age of Onset  . Prostate cancer Brother   . Hypertension Other        several fam members   . Prostate cancer Brother   . Cancer Father   . Colon cancer Neg Hx   . CAD Neg Hx   . Diabetes Neg Hx   . Stroke Neg Hx    . Stomach cancer Neg Hx   . Rectal cancer Neg Hx      Social History   Socioeconomic History  . Marital status: Married    Spouse name: Not on file  . Number of children: 0  . Years of education: Not on file  . Highest education level: Not on file  Occupational History  . Occupation: fully retired at age 89, sanitation  Social Needs  . Financial resource strain: Not on file  . Food insecurity    Worry: Not on file    Inability: Not on file  . Transportation needs    Medical: Not on file    Non-medical: Not on file  Tobacco Use  . Smoking status: Never Smoker  . Smokeless tobacco: Never Used  Substance and Sexual Activity  . Alcohol use: Yes    Comment: beer and wine occasional  . Drug use: No  . Sexual activity: Yes  Lifestyle  . Physical activity    Days per week: Not on file    Minutes per session: Not on file  . Stress: Not on file  Relationships  . Social Herbalist on phone: Not on file    Gets together: Not on file    Attends religious service: Not on file  Active member of club or organization: Not on file    Attends meetings of clubs or organizations: Not on file    Relationship status: Not on file  . Intimate partner violence    Fear of current or ex partner: Not on file    Emotionally abused: Not on file    Physically abused: Not on file    Forced sexual activity: Not on file  Other Topics Concern  . Not on file  Social History Narrative   Lives w/ wife     PHYSICAL EXAM:  VS: BP 120/75   Pulse 69   Ht 5\' 6"  (1.676 m)   Wt 198 lb (89.8 kg)   BMI 31.96 kg/m  Physical Exam Gen: NAD, alert, cooperative with exam, well-appearing ENT: normal lips, normal nasal mucosa,  Eye: normal EOM, normal conjunctiva and lids CV:  no edema, +2 pedal pulses   Resp: no accessory muscle use, non-labored,  Skin: no rashes, no areas of induration  Neuro: normal tone, normal sensation to touch Psych:  normal insight, alert and oriented MSK:   Right and left shoulder: Normal active flexion and abduction. Normal internal and external rotation. Normal external rotation and abduction. Normal empty can testing. Negative Hawkin's test. Negative speeds test. Neurovascularly intact   Aspiration/Injection Procedure Note Dustin Wall [birth year]  Procedure: Injection Indications: Right shoulder pain  Procedure Details Consent: Risks of procedure as well as the alternatives and risks of each were explained to the (patient/caregiver).  Consent for procedure obtained. Time Out: Verified patient identification, verified procedure, site/side was marked, verified correct patient position, special equipment/implants available, medications/allergies/relevent history reviewed, required imaging and test results available.  Performed.  The area was cleaned with iodine and alcohol swabs.    The right shoulder glenohumeral joint was injected using 1 cc's of 40 mg Kenalog and 4 cc's of 0.25% bupivacaine with a 22 3 1/2" needle.  Ultrasound was used. Images were obtained in short views showing the injection.     A sterile dressing was applied.  Patient did tolerate procedure well.   Aspiration/Injection Procedure Note Dustin Wall October 03, 1951  Procedure: Injection Indications: Left shoulder pain  Procedure Details Consent: Risks of procedure as well as the alternatives and risks of each were explained to the (patient/caregiver).  Consent for procedure obtained. Time Out: Verified patient identification, verified procedure, site/side was marked, verified correct patient position, special equipment/implants available, medications/allergies/relevent history reviewed, required imaging and test results available.  Performed.  The area was cleaned with iodine and alcohol swabs.    The left shoulder glenohumeral joint was injected using 1 cc's of 40 mg Kenalog and 4 cc's of 0.25% bupivacaine with a 22 3 1/2" needle.  Ultrasound was used. Images were  obtained in short views showing the injection.     A sterile dressing was applied.  Patient did tolerate procedure well.      ASSESSMENT & PLAN:   Chronic right shoulder pain Pain at night seems more degenerative in nature.  Has had some improvement with his other pain with physical therapy. -Glenohumeral injection. -Counseled on home exercise therapy and supportive care. -Could consider subacromial injection and get imaging.  Acute pain of left shoulder Pain at night is his main concern.  Seems less rotator cuff and more joint related. -Glenohumeral injection. -Counseled on home exercise therapy and supportive care. -Could consider subacromial injection and get imaging.

## 2019-10-28 NOTE — Assessment & Plan Note (Addendum)
Pain at night seems more degenerative in nature.  Has had some improvement with his other pain with physical therapy. -Glenohumeral injection. -Counseled on home exercise therapy and supportive care. -Could consider subacromial injection and get imaging.

## 2019-10-28 NOTE — Patient Instructions (Signed)
Good to see you Please tr ice  Please continue the exercises   Please send me a message in MyChart with any questions or updates.  Please see me back in 4 weeks or sooner if needed.   --Dr. Raeford Razor

## 2020-02-20 ENCOUNTER — Ambulatory Visit: Payer: Medicare HMO | Attending: Internal Medicine

## 2020-02-20 DIAGNOSIS — Z23 Encounter for immunization: Secondary | ICD-10-CM | POA: Insufficient documentation

## 2020-02-20 NOTE — Progress Notes (Signed)
   Covid-19 Vaccination Clinic  Name:  Dustin Wall    MRN: [id number] DOB: 01-Feb-1951  02/20/2020  [name] was observed post Covid-19 immunization for 15 minutes without incidence. He was provided with Vaccine Information Sheet and instruction to access the V-Safe system.   [name] was instructed to call 911 with any severe reactions post vaccine: Marland Kitchen Difficulty breathing  . Swelling of your face and throat  . A fast heartbeat  . A bad rash all over your body  . Dizziness and weakness    Immunizations Administered    Name Date Dose VIS Date Route   Pfizer COVID-19 Vaccine 02/20/2020 11:05 AM 0.3 mL 12/06/2019 Intramuscular   Manufacturer: [place]   Lot: [id number]   [place]: [id number]

## 2020-02-25 ENCOUNTER — Encounter: Payer: Self-pay | Admitting: Internal Medicine

## 2020-02-25 ENCOUNTER — Ambulatory Visit (INDEPENDENT_AMBULATORY_CARE_PROVIDER_SITE_OTHER): Payer: Medicare HMO | Admitting: Internal Medicine

## 2020-02-25 ENCOUNTER — Other Ambulatory Visit: Payer: Self-pay

## 2020-02-25 VITALS — BP 131/84 | HR 66 | Temp 96.9°F | Resp 16 | Ht 66.0 in | Wt 205.0 lb

## 2020-02-25 DIAGNOSIS — E039 Hypothyroidism, unspecified: Secondary | ICD-10-CM

## 2020-02-25 DIAGNOSIS — M25511 Pain in right shoulder: Secondary | ICD-10-CM | POA: Diagnosis not present

## 2020-02-25 DIAGNOSIS — E785 Hyperlipidemia, unspecified: Secondary | ICD-10-CM

## 2020-02-25 DIAGNOSIS — M25512 Pain in left shoulder: Secondary | ICD-10-CM | POA: Diagnosis not present

## 2020-02-25 NOTE — Patient Instructions (Addendum)
Please schedule Medicare Wellness with Glenard Haring.   GO TO THE LAB : Get the blood work     Jackson back for a checkup in 3 months, please make an appointment   STOP BY THE FIRST FLOOR:  get the XR    Stop taking atorvastatin for 3 weeks then go back on it. When you come back in 3 months I like to see how the pain was without taking the cholesterol medication.

## 2020-02-25 NOTE — Progress Notes (Signed)
Subjective:    Patient ID: Dustin Wall, male    DOB: 1951-03-24, 69 y.o.   MRN: [id number]  DOS:  02/25/2020 Type of visit - description: Follow-up Today we discussed the following: Cholesterol management Hypothyroidism He also is concerned about shoulder pain: Started as a right shoulder pain and now is bilateral, was seen by sports medicine, did some physical therapy without much improvement, eventually had local injections on each side with temporary relief. At this point he continue with pain, located at the deltoid areas, it is nocturnal sometimes.  Denies neck pain, no fever chills.  No weight loss No headaches No hip girdle pain.   Wt Readings from Last 3 Encounters:  02/25/20 205 lb (93 kg)  10/28/19 198 lb (89.8 kg)  10/15/19 199 lb (90.3 kg)      Review of Systems See above   Past Medical History:  Diagnosis Date  . Ascending aortic aneurysm ([place])    dx 2010 ~ 4.4 cm  . GERD (gastroesophageal reflux disease)   . Hyperlipidemia   . Hypertension   . Hypothyroidism    s/p hyperthyroidism, s/p radioiodine    Past Surgical History:  Procedure Laterality Date  . HEMORRHOID SURGERY      Allergies as of 02/25/2020      Reactions   Gadolinium Derivatives Nausea And Vomiting   Pt was given IV Zofran per Dr Kathlene Cote prior to imaging for n/v   Pt states prev n/v from gad at GI from MRA      Medication List       Accurate as of February 25, 2020  2:26 PM. If you have any questions, ask your nurse or doctor.        amLODipine 10 MG tablet Commonly known as: NORVASC Take 1 tablet (10 mg total) by mouth daily.   atorvastatin 40 MG tablet Commonly known as: LIPITOR Take 1 tablet (40 mg total) by mouth at bedtime.   fluticasone 0.005 % ointment Commonly known as: CUTIVATE Apply 1 application topically 2 (two) times daily.   levothyroxine 125 MCG tablet Commonly known as: SYNTHROID Take 1 tablet (125 mcg total) by mouth daily before breakfast.   metoprolol  succinate 100 MG 24 hr tablet Commonly known as: TOPROL-XL Take 1 tablet (100 mg total) by mouth daily. Take with or immediately following a meal.   pantoprazole 40 MG tablet Commonly known as: PROTONIX Take 1 tablet (40 mg total) by mouth daily before breakfast.        Objective:   Physical Exam BP 131/84 (BP Location: Left Arm, Patient Position: Sitting, Cuff Size: Normal)   Pulse 66   Temp (!) 96.9 F (36.1 C) (Temporal)   Resp 16   Ht 5\' 6"  (1.676 m)   Wt 205 lb (93 kg)   SpO2 99%   BMI 33.09 kg/m  General:   Well developed, NAD, BMI noted. HEENT:  Normocephalic . Face symmetric, atraumatic Shoulders: Symmetric, range of motion normal, no TTP at the [facility] joint Lungs:  CTA B Normal respiratory effort, no intercostal retractions, no accessory muscle use. Heart: RRR,  no murmur.  Lower extremities: no pretibial edema bilaterally  Skin: Not pale. Not jaundice Neurologic:  alert & oriented X3.  Speech normal, gait appropriate for age and unassisted Psych--  Cognition and judgment appear intact.  Cooperative with normal attention span and concentration.  Behavior appropriate. No anxious or depressed appearing.      Assessment     Assessment HTN Hyperlipidemia Hypothyroidism (hypothyroidism  >  S/P  radioiodine) Ascending aortic aneurysm DX 2010, 4.5 cm. MRA 08-2017 stable; Dr. Roxan Hockey Vitiligo Acanthosis Nigricans Varicose veins   PLAN: HTN: Continue amlodipine High cholesterol, last FLP not at goal, reports good compliance with Lipitor, check a FLP.  See next Shoulder pain: The patient has persistent bilateral shoulder pain located at the deltoid areas.  He is very adamant about getting x-rays.  Will do. He is on statins, this could be a side effect, recommend to hold atorvastatin for 3 weeks and then go back on it, reassess in 3 months. For completeness we'll check a sed rate and total CK. Hypothyroidism: Last TSH in range but slightly elevated.   Recheck. RTC 3 months   This visit occurred during the SARS-CoV-2 public health emergency.  Safety protocols were in place, including screening questions prior to the visit, additional usage of staff PPE, and extensive cleaning of exam room while observing appropriate contact time as indicated for disinfecting solutions.

## 2020-02-25 NOTE — Progress Notes (Signed)
Pre visit review using our clinic review tool, if applicable. No additional management support is needed unless otherwise documented below in the visit note. 

## 2020-02-26 LAB — CK: Total CK: 256 U/L — ABNORMAL HIGH (ref 7–232)

## 2020-02-26 LAB — TSH: TSH: 0.86 u[IU]/mL (ref 0.35–4.50)

## 2020-02-26 LAB — LIPID PANEL
Cholesterol: 173 mg/dL (ref 0–200)
HDL: 42 mg/dL (ref >39.00)
LDL Cholesterol: 108 mg/dL — ABNORMAL HIGH (ref 0–99)
NonHDL: 130.57
Total CHOL/HDL Ratio: 4
Triglycerides: 112 mg/dL (ref 0.0–149.0)
VLDL: 22.4 mg/dL (ref 0.0–40.0)

## 2020-02-26 LAB — SEDIMENTATION RATE: Sed Rate: 7 mm/hr (ref 0–20)

## 2020-02-26 NOTE — Assessment & Plan Note (Signed)
HTN: Continue amlodipine High cholesterol, last FLP not at goal, reports good compliance with Lipitor, check a FLP.  See next Shoulder pain: The patient has persistent bilateral shoulder pain located at the deltoid areas.  He is very adamant about getting x-rays.  Will do. He is on statins, this could be a side effect, recommend to hold atorvastatin for 3 weeks and then go back on it, reassess in 3 months. For completeness we'll check a sed rate and total CK. Hypothyroidism: Last TSH in range but slightly elevated.  Recheck. RTC 3 months

## 2020-02-27 ENCOUNTER — Other Ambulatory Visit: Payer: Self-pay | Admitting: *Deleted

## 2020-02-27 DIAGNOSIS — I712 Thoracic aortic aneurysm, without rupture, unspecified: Secondary | ICD-10-CM

## 2020-03-02 ENCOUNTER — Ambulatory Visit (HOSPITAL_BASED_OUTPATIENT_CLINIC_OR_DEPARTMENT_OTHER)
Admission: RE | Admit: 2020-03-02 | Discharge: 2020-03-02 | Disposition: A | Discharge status: Home / Self Care | Payer: Medicare HMO | Source: Ambulatory Visit | Attending: Internal Medicine | Admitting: Internal Medicine

## 2020-03-02 ENCOUNTER — Other Ambulatory Visit: Payer: Self-pay

## 2020-03-02 DIAGNOSIS — M25511 Pain in right shoulder: Secondary | ICD-10-CM | POA: Diagnosis not present

## 2020-03-02 DIAGNOSIS — M25512 Pain in left shoulder: Secondary | ICD-10-CM | POA: Insufficient documentation

## 2020-03-02 DIAGNOSIS — M19012 Primary osteoarthritis, left shoulder: Secondary | ICD-10-CM | POA: Diagnosis not present

## 2020-03-05 ENCOUNTER — Telehealth: Payer: Self-pay | Admitting: Internal Medicine

## 2020-03-05 NOTE — Chronic Care Management (AMB) (Signed)
°  Chronic Care Management   Note  03/05/2020 Name: Dustin Wall MRN: [id number] DOB: 03-13-1951  Dustin Wall is a 69 y.o. year old male who is a primary care patient of Larose Kells, Alda Berthold, MD. I reached out to [facility] by phone today in response to a referral sent by Dustin Wall PCP, Colon Branch, MD.   Dustin Wall was given information about Chronic Care Management services today including:  1. CCM service includes personalized support from designated clinical staff supervised by his physician, including individualized plan of care and coordination with other care providers 2. 24/7 contact phone numbers for assistance for urgent and routine care needs. 3. Service will only be billed when office clinical staff spend 20 minutes or more in a month to coordinate care. 4. Only one practitioner may furnish and bill the service in a calendar month. 5. The patient may stop CCM services at any time (effective at the end of the month) by phone call to the office staff.   Patient did not agree to services and wishes to consider information provided before deciding about enrollment in care management services.   Follow up plan:   Raynicia Dukes [employer] Scheduler

## 2020-03-11 ENCOUNTER — Ambulatory Visit: Payer: Medicare HMO | Attending: Internal Medicine

## 2020-03-11 ENCOUNTER — Ambulatory Visit: Payer: Medicare HMO | Admitting: Internal Medicine

## 2020-03-11 DIAGNOSIS — Z23 Encounter for immunization: Secondary | ICD-10-CM

## 2020-03-11 NOTE — Progress Notes (Signed)
   Covid-19 Vaccination Clinic  Name:  Dustin Wall    MRN: [id number] DOB: 1951/05/28  03/11/2020  Dustin Wall was observed post Covid-19 immunization for 15 minutes without incident. He was provided with Vaccine Information Sheet and instruction to access the V-Safe system.   Dustin Wall was instructed to call 911 with any severe reactions post vaccine: Marland Kitchen Difficulty breathing  . Swelling of face and throat  . A fast heartbeat  . A bad rash all over body  . Dizziness and weakness   Immunizations Administered    Name Date Dose VIS Date Route   Pfizer COVID-19 Vaccine 03/11/2020 12:50 PM 0.3 mL 12/06/2019 Intramuscular   Manufacturer: Malanie Koloski   Lot: [id number]   [place]: [id number]

## 2020-04-14 ENCOUNTER — Encounter: Payer: Medicare HMO | Admitting: Thoracic Surgery (Cardiothoracic Vascular Surgery)

## 2020-05-15 ENCOUNTER — Telehealth: Payer: Self-pay | Admitting: Internal Medicine

## 2020-05-15 DIAGNOSIS — K824 Cholesterolosis of gallbladder: Secondary | ICD-10-CM

## 2020-05-15 NOTE — Telephone Encounter (Signed)
Please schedule RUQ abdominal ultrasound DX gallbladder polyp. Let the patient know.

## 2020-05-15 NOTE — Telephone Encounter (Signed)
Order placed

## 2020-05-18 ENCOUNTER — Telehealth: Payer: Self-pay | Admitting: Internal Medicine

## 2020-05-18 NOTE — Telephone Encounter (Signed)
LMOM informing Pt reason for RUQ ultrasound f/u. Instructed to let us know if he doesn't receive call in the next several days to schedule appt.

## 2020-05-18 NOTE — Telephone Encounter (Signed)
CallerShermaine Wall  Call Back # [phone]  Patient requesting a call back in reference to call from Friday from [place]. Patient states he is overdue to for ultrasound.

## 2020-05-24 DIAGNOSIS — H44002 Unspecified purulent endophthalmitis, left eye: Secondary | ICD-10-CM | POA: Diagnosis not present

## 2020-05-24 DIAGNOSIS — L03213 Periorbital cellulitis: Secondary | ICD-10-CM | POA: Diagnosis not present

## 2020-05-26 ENCOUNTER — Encounter: Payer: Self-pay | Admitting: Internal Medicine

## 2020-05-26 ENCOUNTER — Ambulatory Visit (INDEPENDENT_AMBULATORY_CARE_PROVIDER_SITE_OTHER): Payer: Medicare HMO | Admitting: Internal Medicine

## 2020-05-26 ENCOUNTER — Other Ambulatory Visit: Payer: Self-pay

## 2020-05-26 VITALS — BP 139/91 | HR 66 | Temp 97.7°F | Resp 16 | Ht 66.0 in | Wt 205.1 lb

## 2020-05-26 DIAGNOSIS — E039 Hypothyroidism, unspecified: Secondary | ICD-10-CM | POA: Diagnosis not present

## 2020-05-26 DIAGNOSIS — I10 Essential (primary) hypertension: Secondary | ICD-10-CM | POA: Diagnosis not present

## 2020-05-26 DIAGNOSIS — L03213 Periorbital cellulitis: Secondary | ICD-10-CM

## 2020-05-26 DIAGNOSIS — H1012 Acute atopic conjunctivitis, left eye: Secondary | ICD-10-CM | POA: Diagnosis not present

## 2020-05-26 NOTE — Patient Instructions (Addendum)
Continue checking your blood pressure BP GOAL is between 110/65 and  135/85. If it is consistently higher or lower, let me know  Continue taking antibiotic for your eyes Start Claritin OTC 10 mg 1 tablet daily Cold pack twice a day Get some hydrocortisone cream 1% OTC: Apply twice a day around the eyelids. We are referring you to the eye doctor Call immediately if you have severe facial swelling, difficulty with your vision.   GO TO THE LAB : Get the blood work     [place], [place] Come back for a physical exam in 3 months  Stop by the first floor, schedule the ultrasound of your gallbladder

## 2020-05-26 NOTE — Progress Notes (Signed)
Pre visit review using our clinic review tool, if applicable. No additional management support is needed unless otherwise documented below in the visit note. 

## 2020-05-26 NOTE — Assessment & Plan Note (Signed)
Periorbital cellulitis: Suspect periorbital cellulitis in the context of allergic conjunctivitis.  The swelling is better since he is started taking Augmentin prescribed at the urgent care 2 days ago. Plan: Ice pack, finish Augmentin, Claritin, hydrocortisone OTC and the skin around the eyes, refer to ophthalmology (is schedule for tomorrow), definitely call if not improving. Allergic conjunctivitis: As above HTN: Reports normal ambulatory BPs, continue with Amlodipine, metoprolol, check a CMP and CBC Hypothyroidism: On Synthroid, check a TSH RTC CPX 3 months

## 2020-05-26 NOTE — Progress Notes (Signed)
Subjective:    Patient ID: Dustin Wall, male    DOB: 01/07/1951, 69 y.o.   MRN: [id number]  DOS:  05/26/2020 Type of visit - description: Acute visit.  Developed left-sided eye irritation itching about a week ago. On 05/24/2020 he woke up swollen and she saw some discharge. Went to urgent care, they confirmed the swelling. Ocular movements, conjunctiva and sclera were normal.  Pupils were normal. Was Rx Augmentin for possible preseptal cellulitis. Since then, the area is less puffy.  Since he is here today we also talk about his chronic medical problems  Review of Systems Denies fever chills No blurred or double vision   Past Medical History:  Diagnosis Date   Ascending aortic aneurysm ([place])    dx 2010 ~ 4.4 cm   GERD (gastroesophageal reflux disease)    Hyperlipidemia    Hypertension    Hypothyroidism    s/p hyperthyroidism, s/p radioiodine    Past Surgical History:  Procedure Laterality Date   HEMORRHOID SURGERY      Allergies as of 05/26/2020      Reactions   Gadolinium Derivatives Nausea And Vomiting   Pt was given IV Zofran per Dr Kathlene Cote prior to imaging for n/v   Pt states prev n/v from gad at GI from MRA      Medication List       Accurate as of May 26, 2020  2:58 PM. If you have any questions, ask your nurse or doctor.        amLODipine 10 MG tablet Commonly known as: NORVASC Take 1 tablet (10 mg total) by mouth daily.   amoxicillin-clavulanate 875-125 MG tablet Commonly known as: AUGMENTIN Take 1 tablet by mouth in the morning and at bedtime.   atorvastatin 40 MG tablet Commonly known as: LIPITOR Take 1 tablet (40 mg total) by mouth at bedtime.   fluticasone 0.005 % ointment Commonly known as: CUTIVATE Apply 1 application topically 2 (two) times daily.   levothyroxine 125 MCG tablet Commonly known as: SYNTHROID Take 1 tablet (125 mcg total) by mouth daily before breakfast.   metoprolol succinate 100 MG 24 hr tablet Commonly known  as: TOPROL-XL Take 1 tablet (100 mg total) by mouth daily. Take with or immediately following a meal.   pantoprazole 40 MG tablet Commonly known as: PROTONIX Take 1 tablet (40 mg total) by mouth daily before breakfast.          Objective:   Physical Exam HENT:     Head:     BP (!) 139/91 (BP Location: Left Arm, Patient Position: Sitting, Cuff Size: Normal)    Pulse 66    Temp 97.7 F (36.5 C) (Temporal)    Resp 16    Ht 5\' 6"  (1.676 m)    Wt 205 lb 2 oz (93 kg)    SpO2 100%    BMI 33.11 kg/m  General:   Well developed, NAD, BMI noted. HEENT:  Normocephalic . Face atraumatic. See graphic EOMI, pupils equal and reactive, conjunctivas not red.  No photophobia, anterior chambers normal The periorbital area and eyelids (L) slightly swollen and warm.  Minimal discharge noted. Lungs:  CTA B Normal respiratory effort, no intercostal retractions, no accessory muscle use. Heart: RRR,  no murmur.  Lower extremities: no pretibial edema bilaterally  Skin: Not pale. Not jaundice Neurologic:  alert & oriented X3.  Speech normal, gait appropriate for age and unassisted Psych--  Cognition and judgment appear intact.  Cooperative with normal attention span and  concentration.  Behavior appropriate. No anxious or depressed appearing.      Assessment    Assessment HTN Hyperlipidemia Hypothyroidism (hypothyroidism  > S/P  radioiodine) Ascending aortic aneurysm DX 2010, 4.5 cm. MRA 08-2017 stable; Dr. Roxan Hockey Vitiligo Acanthosis Nigricans Varicose veins   PLAN: Periorbital cellulitis: Suspect periorbital cellulitis in the context of allergic conjunctivitis.  The swelling is better since he is started taking Augmentin prescribed at the urgent care 2 days ago. Plan: Ice pack, finish Augmentin, Claritin, hydrocortisone OTC and the skin around the eyes, refer to ophthalmology (is schedule for tomorrow), definitely call if not improving. Allergic conjunctivitis: As above HTN:  Reports normal ambulatory BPs, continue with Amlodipine, metoprolol, check a CMP and CBC Hypothyroidism: On Synthroid, check a TSH RTC CPX 3 months   This visit occurred during the SARS-CoV-2 public health emergency.  Safety protocols were in place, including screening questions prior to the visit, additional usage of staff PPE, and extensive cleaning of exam room while observing appropriate contact time as indicated for disinfecting solutions.

## 2020-05-27 ENCOUNTER — Ambulatory Visit: Payer: Medicare HMO | Admitting: Internal Medicine

## 2020-05-27 DIAGNOSIS — H2513 Age-related nuclear cataract, bilateral: Secondary | ICD-10-CM | POA: Diagnosis not present

## 2020-05-27 DIAGNOSIS — H00035 Abscess of left lower eyelid: Secondary | ICD-10-CM | POA: Diagnosis not present

## 2020-05-27 DIAGNOSIS — H00034 Abscess of left upper eyelid: Secondary | ICD-10-CM | POA: Diagnosis not present

## 2020-05-27 DIAGNOSIS — H04123 Dry eye syndrome of bilateral lacrimal glands: Secondary | ICD-10-CM | POA: Diagnosis not present

## 2020-05-27 LAB — COMPREHENSIVE METABOLIC PANEL
ALT: 40 U/L (ref 0–53)
AST: 28 U/L (ref 0–37)
Albumin: 4.3 g/dL (ref 3.5–5.2)
Alkaline Phosphatase: 65 U/L (ref 39–117)
BUN: 12 mg/dL (ref 6–23)
CO2: 30 mEq/L (ref 19–32)
Calcium: 9.5 mg/dL (ref 8.4–10.5)
Chloride: 101 mEq/L (ref 96–112)
Creatinine, Ser: 0.94 mg/dL (ref 0.40–1.50)
GFR: 96.05 mL/min (ref >60.00)
Glucose, Bld: 97 mg/dL (ref 70–99)
Potassium: 4.2 mEq/L (ref 3.5–5.1)
Sodium: 135 mEq/L (ref 135–145)
Total Bilirubin: 0.5 mg/dL (ref 0.2–1.2)
Total Protein: 7.2 g/dL (ref 6.0–8.3)

## 2020-05-27 LAB — CBC WITH DIFFERENTIAL/PLATELET
Basophils Absolute: 0 10*3/uL (ref 0.0–0.1)
Basophils Relative: 1 % (ref 0.0–3.0)
Eosinophils Absolute: 0.2 10*3/uL (ref 0.0–0.7)
Eosinophils Relative: 3.7 % (ref 0.0–5.0)
HCT: 43.5 % (ref 39.0–52.0)
Hemoglobin: 14.4 g/dL (ref 13.0–17.0)
Lymphocytes Relative: 44.6 % (ref 12.0–46.0)
Lymphs Abs: 2 10*3/uL (ref 0.7–4.0)
MCHC: 33 g/dL (ref 30.0–36.0)
MCV: 85.6 fl (ref 78.0–100.0)
Monocytes Absolute: 0.4 10*3/uL (ref 0.1–1.0)
Monocytes Relative: 9.7 % (ref 3.0–12.0)
Neutro Abs: 1.9 10*3/uL (ref 1.4–7.7)
Neutrophils Relative %: 41 % — ABNORMAL LOW (ref 43.0–77.0)
Platelets: 202 10*3/uL (ref 150.0–400.0)
RBC: 5.09 Mil/uL (ref 4.22–5.81)
RDW: 15.8 % — ABNORMAL HIGH (ref 11.5–15.5)
WBC: 4.6 10*3/uL (ref 4.0–10.5)

## 2020-05-27 LAB — TSH: TSH: 5.73 u[IU]/mL — ABNORMAL HIGH (ref 0.35–4.50)

## 2020-05-29 MED ORDER — LEVOTHYROXINE SODIUM 150 MCG PO TABS: 150.0000 ug | ORAL_TABLET | Freq: Every day | ORAL | 1 refills | Status: DC

## 2020-05-29 NOTE — Addendum Note (Signed)
Addended byDamita Dunnings D on: 05/29/2020 02:19 PM   Modules accepted: Orders

## 2020-06-03 ENCOUNTER — Other Ambulatory Visit: Payer: Self-pay

## 2020-06-03 ENCOUNTER — Ambulatory Visit (HOSPITAL_BASED_OUTPATIENT_CLINIC_OR_DEPARTMENT_OTHER)
Admission: RE | Admit: 2020-06-03 | Discharge: 2020-06-03 | Disposition: A | Discharge status: Home / Self Care | Payer: Medicare HMO | Source: Ambulatory Visit | Attending: Internal Medicine | Admitting: Internal Medicine

## 2020-06-03 DIAGNOSIS — K824 Cholesterolosis of gallbladder: Secondary | ICD-10-CM | POA: Diagnosis not present

## 2020-06-03 DIAGNOSIS — K760 Fatty (change of) liver, not elsewhere classified: Secondary | ICD-10-CM | POA: Diagnosis not present

## 2020-06-15 DIAGNOSIS — H04123 Dry eye syndrome of bilateral lacrimal glands: Secondary | ICD-10-CM | POA: Diagnosis not present

## 2020-06-15 DIAGNOSIS — H0102B Squamous blepharitis left eye, upper and lower eyelids: Secondary | ICD-10-CM | POA: Diagnosis not present

## 2020-06-15 DIAGNOSIS — H00035 Abscess of left lower eyelid: Secondary | ICD-10-CM | POA: Diagnosis not present

## 2020-06-15 DIAGNOSIS — H00034 Abscess of left upper eyelid: Secondary | ICD-10-CM | POA: Diagnosis not present

## 2020-06-27 ENCOUNTER — Other Ambulatory Visit: Payer: Self-pay | Admitting: Internal Medicine

## 2020-07-01 ENCOUNTER — Telehealth: Payer: Self-pay | Admitting: Internal Medicine

## 2020-07-01 ENCOUNTER — Other Ambulatory Visit (INDEPENDENT_AMBULATORY_CARE_PROVIDER_SITE_OTHER): Payer: Medicare HMO

## 2020-07-01 ENCOUNTER — Other Ambulatory Visit: Payer: Self-pay

## 2020-07-01 DIAGNOSIS — E039 Hypothyroidism, unspecified: Secondary | ICD-10-CM

## 2020-07-01 LAB — TSH: TSH: 0.52 u[IU]/mL (ref 0.35–4.50)

## 2020-07-01 MED ORDER — TRIAMCINOLONE ACETONIDE 0.025 % EX OINT: 1.0000 "application " | TOPICAL_OINTMENT | Freq: Every day | CUTANEOUS | 0 refills | Status: DC | PRN

## 2020-07-01 NOTE — Telephone Encounter (Signed)
Rx sent 

## 2020-07-01 NOTE — Telephone Encounter (Signed)
Pt came in office stating is needing a refill on Triamcinolon 0.025% Finger send to pharmacy, , [id number] - [place], [place] [address]  [place], [place] [address]  Phone:  [phone] Fax:  [phone]  DEA #:  [id number], pt mentioned it has been a while that he had this cream requested for refill.  Please advise.

## 2020-07-02 MED ORDER — LEVOTHYROXINE SODIUM 150 MCG PO TABS: 150.0000 ug | ORAL_TABLET | Freq: Every day | ORAL | 1 refills | Status: DC

## 2020-07-02 NOTE — Addendum Note (Signed)
Addended byDamita Dunnings D on: 07/02/2020 08:50 AM   Modules accepted: Orders

## 2020-08-28 ENCOUNTER — Ambulatory Visit: Payer: Medicare HMO | Admitting: Internal Medicine

## 2020-08-28 DIAGNOSIS — H2513 Age-related nuclear cataract, bilateral: Secondary | ICD-10-CM | POA: Diagnosis not present

## 2020-08-28 DIAGNOSIS — H0102B Squamous blepharitis left eye, upper and lower eyelids: Secondary | ICD-10-CM | POA: Diagnosis not present

## 2020-08-28 DIAGNOSIS — H00034 Abscess of left upper eyelid: Secondary | ICD-10-CM | POA: Diagnosis not present

## 2020-08-28 DIAGNOSIS — H00035 Abscess of left lower eyelid: Secondary | ICD-10-CM | POA: Diagnosis not present

## 2020-09-07 ENCOUNTER — Other Ambulatory Visit: Payer: Self-pay | Admitting: Internal Medicine

## 2020-09-08 ENCOUNTER — Ambulatory Visit (INDEPENDENT_AMBULATORY_CARE_PROVIDER_SITE_OTHER): Payer: Medicare HMO | Admitting: Internal Medicine

## 2020-09-08 ENCOUNTER — Encounter: Payer: Self-pay | Admitting: Internal Medicine

## 2020-09-08 ENCOUNTER — Other Ambulatory Visit: Payer: Self-pay

## 2020-09-08 VITALS — BP 99/69 | HR 75 | Temp 98.0°F | Resp 18 | Ht 66.0 in | Wt 200.5 lb

## 2020-09-08 DIAGNOSIS — E039 Hypothyroidism, unspecified: Secondary | ICD-10-CM | POA: Diagnosis not present

## 2020-09-08 DIAGNOSIS — I10 Essential (primary) hypertension: Secondary | ICD-10-CM | POA: Diagnosis not present

## 2020-09-08 LAB — TSH: TSH: 0.02 mIU/L — ABNORMAL LOW (ref 0.40–4.50)

## 2020-09-08 NOTE — Progress Notes (Signed)
Subjective:    Patient ID: Dustin Wall, male    DOB: [birth year], 69 y.o.   MRN: [id number]  DOS:  09/08/2020 Type of visit - description: Routine visit Since the last visit here few months ago, had another episode of preseptal cellulitis. Was treated by Dr. Jerline Pain. He is fully recuperated.   BP Readings from Last 3 Encounters:  09/08/20 99/69  05/26/20 (!) 139/91  02/25/20 131/84    Review of Systems See above   Past Medical History:  Diagnosis Date  . Ascending aortic aneurysm ([place])    dx 2010 ~ 4.4 cm  . GERD (gastroesophageal reflux disease)   . Hyperlipidemia   . Hypertension   . Hypothyroidism    s/p hyperthyroidism, s/p radioiodine    Past Surgical History:  Procedure Laterality Date  . HEMORRHOID SURGERY      Allergies as of 09/08/2020      Reactions   Gadolinium Derivatives Nausea And Vomiting   Pt was given IV Zofran per Dr Kathlene Cote prior to imaging for n/v   Pt states prev n/v from gad at GI from MRA      Medication List       Accurate as of September 08, 2020 11:59 PM. If you have any questions, ask your nurse or doctor.        amLODipine 10 MG tablet Commonly known as: NORVASC Take 1 tablet (10 mg total) by mouth daily.   atorvastatin 40 MG tablet Commonly known as: LIPITOR Take 1 tablet (40 mg total) by mouth at bedtime.   fluticasone 0.005 % ointment Commonly known as: CUTIVATE Apply 1 application topically 2 (two) times daily.   levothyroxine 150 MCG tablet Commonly known as: SYNTHROID Take 1 tablet (150 mcg total) by mouth daily before breakfast.   metoprolol succinate 100 MG 24 hr tablet Commonly known as: TOPROL-XL Take 1 tablet (100 mg total) by mouth daily. Take with or immediately following a meal.   pantoprazole 40 MG tablet Commonly known as: PROTONIX Take 1 tablet (40 mg total) by mouth daily before breakfast.   triamcinolone 0.025 % ointment Commonly known as: KENALOG Apply 1 application topically daily as needed.           Objective:   Physical Exam BP 99/69 (BP Location: Left Arm, Patient Position: Sitting, Cuff Size: Normal)   Pulse 75   Temp 98 F (36.7 C) (Oral)   Resp 18   Ht 5\' 6"  (1.676 m)   Wt 200 lb 8 oz (90.9 kg)   SpO2 98%   BMI 32.36 kg/m  General:   Well developed, NAD, BMI noted. HEENT:  Normocephalic . Face symmetric, atraumatic Lungs:  CTA B Normal respiratory effort, no intercostal retractions, no accessory muscle use. Heart: RRR,  no murmur.  Lower extremities: no pretibial edema bilaterally  Skin: Not pale. Not jaundice Neurologic:  alert & oriented X3.  Speech normal, gait appropriate for age and unassisted Psych--  Cognition and judgment appear intact.  Cooperative with normal attention span and concentration.  Behavior appropriate. No anxious or depressed appearing.      Assessment     Assessment HTN Hyperlipidemia Hypothyroidism (hypothyroidism  > S/P  radioiodine) Ascending aortic aneurysm DX 2010, 4.5 cm. MRA 08-2017 stable; Dr. Roxan Hockey Vitiligo Acanthosis Nigricans Varicose veins   PLAN: HTN: BP slightly low today, he is asymptomatic, continue amlodipine, metoprolol.  Heart rate is 75. Hyperlipidemia: On Lipitor, no change Hypothyroidism: Check TSH Periorbital cellulitis: has a episode  05/2020 and again last  month.  Patient concerned.  He wonders why.  It is probably related to allergies, he admits he scratched his eyelids frequently which could lead to skin openings and subsequently cellulitis.  The key would be to keep his allergies controlled as they are now with eyedrops as prescribed by Dr. Jerline Pain. Preventive care: Strongly encouraged to have a flu shot. RTC 3 to 4 months CPX This visit occurred during the SARS-CoV-2 public health emergency.  Safety protocols were in place, including screening questions prior to the visit, additional usage of staff PPE, and extensive cleaning of exam room while observing appropriate contact time as  indicated for disinfecting solutions.

## 2020-09-08 NOTE — Progress Notes (Signed)
Pre visit review using our clinic review tool, if applicable. No additional management support is needed unless otherwise documented below in the visit note. 

## 2020-09-08 NOTE — Patient Instructions (Signed)
Get a flu shot this fall  GO TO THE LAB : Get the blood work     [place], [place] back for a physical exam in 3 to 4 months

## 2020-09-09 NOTE — Assessment & Plan Note (Signed)
HTN: BP slightly low today, he is asymptomatic, continue amlodipine, metoprolol.  Heart rate is 75. Hyperlipidemia: On Lipitor, no change Hypothyroidism: Check TSH Periorbital cellulitis: has a episode  05/2020 and again last month.  Patient concerned.  He wonders why.  It is probably related to allergies, he admits he scratched his eyelids frequently which could lead to skin openings and subsequently cellulitis.  The key would be to keep his allergies controlled as they are now with eyedrops as prescribed by Dr. Jerline Pain. Preventive care: Strongly encouraged to have a flu shot. RTC 3 to 4 months CPX

## 2020-09-10 MED ORDER — LEVOTHYROXINE SODIUM 137 MCG PO TABS: 137.0000 ug | ORAL_TABLET | Freq: Every day | ORAL | 2 refills | Status: DC

## 2020-09-10 NOTE — Addendum Note (Signed)
Addended byDamita Dunnings D on: 09/10/2020 12:58 PM   Modules accepted: Orders

## 2020-09-28 DIAGNOSIS — H2513 Age-related nuclear cataract, bilateral: Secondary | ICD-10-CM | POA: Diagnosis not present

## 2020-09-28 DIAGNOSIS — H04123 Dry eye syndrome of bilateral lacrimal glands: Secondary | ICD-10-CM | POA: Diagnosis not present

## 2020-09-28 DIAGNOSIS — H35033 Hypertensive retinopathy, bilateral: Secondary | ICD-10-CM | POA: Diagnosis not present

## 2020-09-28 DIAGNOSIS — H00034 Abscess of left upper eyelid: Secondary | ICD-10-CM | POA: Diagnosis not present

## 2020-10-13 ENCOUNTER — Other Ambulatory Visit: Payer: Self-pay | Admitting: Internal Medicine

## 2020-10-13 DIAGNOSIS — I10 Essential (primary) hypertension: Secondary | ICD-10-CM

## 2020-10-27 DIAGNOSIS — Z0100 Encounter for examination of eyes and vision without abnormal findings: Secondary | ICD-10-CM | POA: Diagnosis not present

## 2020-11-02 ENCOUNTER — Other Ambulatory Visit: Payer: Self-pay | Admitting: Thoracic Surgery (Cardiothoracic Vascular Surgery)

## 2020-11-02 ENCOUNTER — Ambulatory Visit: Payer: Medicare HMO | Attending: Internal Medicine

## 2020-11-02 DIAGNOSIS — I712 Thoracic aortic aneurysm, without rupture, unspecified: Secondary | ICD-10-CM

## 2020-11-02 DIAGNOSIS — I7121 Aneurysm of the ascending aorta, without rupture: Secondary | ICD-10-CM

## 2020-11-02 DIAGNOSIS — Z23 Encounter for immunization: Secondary | ICD-10-CM

## 2020-11-02 NOTE — Progress Notes (Signed)
   Covid-19 Vaccination Clinic  Name:  Frankey Botting    MRN: [id number] DOB: 09-05-1951  11/02/2020  Mr. Leyh was observed post Covid-19 immunization for 15 minutes without incident. He was provided with Vaccine Information Sheet and instruction to access the V-Safe system.   Mr. Choyce was instructed to call 911 with any severe reactions post vaccine: Marland Kitchen Difficulty breathing  . Swelling of face and throat  . A fast heartbeat  . A bad rash all over body  . Dizziness and weakness

## 2020-11-03 ENCOUNTER — Other Ambulatory Visit: Payer: Self-pay | Admitting: Thoracic Surgery (Cardiothoracic Vascular Surgery)

## 2020-11-04 ENCOUNTER — Other Ambulatory Visit: Payer: Self-pay | Admitting: Internal Medicine

## 2020-11-05 ENCOUNTER — Ambulatory Visit
Admission: RE | Admit: 2020-11-05 | Discharge: 2020-11-05 | Disposition: A | Discharge status: Home / Self Care | Payer: Medicare HMO | Source: Ambulatory Visit | Attending: Thoracic Surgery (Cardiothoracic Vascular Surgery) | Admitting: Thoracic Surgery (Cardiothoracic Vascular Surgery)

## 2020-11-05 DIAGNOSIS — I712 Thoracic aortic aneurysm, without rupture, unspecified: Secondary | ICD-10-CM

## 2020-11-05 DIAGNOSIS — K449 Diaphragmatic hernia without obstruction or gangrene: Secondary | ICD-10-CM | POA: Diagnosis not present

## 2020-11-05 DIAGNOSIS — I700 Atherosclerosis of aorta: Secondary | ICD-10-CM | POA: Diagnosis not present

## 2020-11-05 DIAGNOSIS — I2510 Atherosclerotic heart disease of native coronary artery without angina pectoris: Secondary | ICD-10-CM | POA: Diagnosis not present

## 2020-11-09 NOTE — Addendum Note (Signed)
Addended by: Manuela Schwartz on: 11/09/2020 08:42 AM   Modules accepted: Orders

## 2020-11-10 ENCOUNTER — Other Ambulatory Visit: Payer: Self-pay

## 2020-11-10 ENCOUNTER — Other Ambulatory Visit (INDEPENDENT_AMBULATORY_CARE_PROVIDER_SITE_OTHER): Payer: Medicare HMO

## 2020-11-10 DIAGNOSIS — E039 Hypothyroidism, unspecified: Secondary | ICD-10-CM

## 2020-11-10 LAB — TSH: TSH: 0.47 u[IU]/mL (ref 0.35–4.50)

## 2020-11-12 MED ORDER — LEVOTHYROXINE SODIUM 137 MCG PO TABS: 137.0000 ug | ORAL_TABLET | Freq: Every day | ORAL | 1 refills | Status: DC

## 2020-11-12 NOTE — Addendum Note (Signed)
Addended byDamita Dunnings D on: 11/12/2020 12:08 PM   Modules accepted: Orders

## 2020-11-17 ENCOUNTER — Ambulatory Visit (INDEPENDENT_AMBULATORY_CARE_PROVIDER_SITE_OTHER): Payer: Medicare HMO | Admitting: Thoracic Surgery (Cardiothoracic Vascular Surgery)

## 2020-11-17 ENCOUNTER — Encounter: Payer: Self-pay | Admitting: Thoracic Surgery (Cardiothoracic Vascular Surgery)

## 2020-11-17 ENCOUNTER — Other Ambulatory Visit: Payer: Self-pay

## 2020-11-17 VITALS — BP 116/75 | HR 73 | Temp 98.4°F | Resp 18 | Wt 208.4 lb

## 2020-11-17 DIAGNOSIS — I7121 Aneurysm of the ascending aorta, without rupture: Secondary | ICD-10-CM

## 2020-11-17 DIAGNOSIS — I712 Thoracic aortic aneurysm, without rupture: Secondary | ICD-10-CM

## 2020-11-17 NOTE — Progress Notes (Signed)
[place] x2

## 2020-11-17 NOTE — Progress Notes (Signed)
[address]       [place], [address]             [phone]    HPI: Dustin Wall returns for scheduled follow-up visit  Dustin Wall is a 69 year old man with a history of hypertension, hyperlipidemia, aortic atherosclerosis, ascending aneurysm, hypothyroidism following radioactive iodine, and arthritis.  He was first found to have an ascending aneurysm in 2010.  It was 4.4 cm at that time.  Over time he has had slight interval growth.  I last saw him a year ago and it was 4.6 cm.  I recommended 62-month follow-up based on guidelines, but he wanted to continue with once a year.  He has been feeling well.  He is not having any chest pain, pressure, or tightness.  He denies shortness of breath.  He remains active.  Past Medical History:  Diagnosis Date  . Ascending aortic aneurysm ([place])    dx 2010 ~ 4.4 cm  . GERD (gastroesophageal reflux disease)   . Hyperlipidemia   . Hypertension   . Hypothyroidism    s/p hyperthyroidism, s/p radioiodine    Current Outpatient Medications  Medication Sig Dispense Refill  . amLODipine (NORVASC) 10 MG tablet Take 1 tablet (10 mg total) by mouth daily. 90 tablet 1  . atorvastatin (LIPITOR) 40 MG tablet Take 1 tablet (40 mg total) by mouth at bedtime. 90 tablet 2  . fluticasone (CUTIVATE) 0.005 % ointment Apply 1 application topically 2 (two) times daily. 30 g 0  . levothyroxine (SYNTHROID) 137 MCG tablet Take 1 tablet (137 mcg total) by mouth daily before breakfast. 90 tablet 1  . metoprolol succinate (TOPROL-XL) 100 MG 24 hr tablet Take 1 tablet (100 mg total) by mouth daily. Take with or immediately following a meal. 90 tablet 1  . pantoprazole (PROTONIX) 40 MG tablet Take 1 tablet (40 mg total) by mouth daily before breakfast. 90 tablet 3  . triamcinolone (KENALOG) 0.025 % ointment Apply 1 application topically daily as needed. 80 g 0   No current facility-administered medications for this visit.    Physical Exam BP 116/75  (BP Location: Right Arm, Patient Position: Sitting, Cuff Size: Large)   Pulse 73   Temp 98.4 F (36.9 C) (Skin)   Resp 18   Wt 208 lb 6.4 oz (94.5 kg)   SpO2 95% Comment: RA  BMI 33.65 kg/m  69 year old man in no acute distress Alert and oriented x3 with no focal deficits Lungs clear with equal breath sounds bilaterally Cardiac regular rate and rhythm no rubs or murmurs No carotid bruits No peripheral edema  Diagnostic Tests: CT CHEST WITHOUT CONTRAST  TECHNIQUE: Multidetector CT imaging of the chest was performed following the standard protocol without IV contrast.  COMPARISON:  MR 10/10/2019 CT 08/05/2014, 10/08/2014  FINDINGS: Cardiovascular: Heart size unchanged. No pericardial fluid/thickening. Calcifications of the left anterior descending and right coronary arteries.  Minimal aortic valve calcifications.  Greatest diameter of the ascending aorta estimated 47 mm. Greatest diameter of the ascending aorta on the prior MR was measured 46 mm.  Atherosclerotic changes of the aortic arch.  Mediastinum/Nodes: Unremarkable thoracic inlet. No mediastinal adenopathy. Unremarkable appearance of the thoracic esophagus. Small hiatal hernia.  Lungs/Pleura: No pneumothorax. No pleural effusion. No confluent airspace disease.  Upper Abdomen:  No acute finding of the upper abdomen  Musculoskeletal: No acute displaced fracture. Degenerative changes of the spine with multiple endplate changes similar to the comparison CT of 2015. No  bony canal narrowing.  IMPRESSION: Similar diameter of the ascending aorta, estimated 4.7 cm on the current CT. Aortic aneurysm NOS (ICD10-I71.9).  Aortic atherosclerosis and coronary artery disease. Aortic Atherosclerosis (ICD10-I70.0).  Signed,  Dulcy Fanny. Dellia Nims, RPVI  [facility]  [facility]   Electronically Signed   By: Corrie Mckusick D.O.   On: 11/05/2020  13:47 I personally reviewed the CT images and concur with the findings noted above  Impression: Dustin Wall is a 69 year old man with a history of hypertension, hyperlipidemia, aortic atherosclerosis, ascending aneurysm, hypothyroidism following radioactive iodine, and arthritis.  Ascending aortic aneurysm-remained stable.  First noted in 2010 was 4.4 cm.  A year ago was 4.6 cm.  Measured about 4.7 cm on today's scan, but difficult to get a precise measurement due to the lack of contrast.  In the event there is been no significant change.  I again reviewed with him the guidelines recommend every 6 months follow-up.  He says he just cannot go through the machine more than once a year.  Plan return in 1 year with MR angio.  Thoracic aortic atherosclerosis-on Lipitor  Hypertension-blood pressure well controlled on current regimen  Plan: Return in 1 year with MR angio If the patient will change his mind and go to 34-month intervals he will call and we will schedule that for him.  I spent over 20 minutes in review of records, images, and in consultation with Dustin Wall today. Melrose Nakayama, MD [facility] [phone]

## 2021-01-11 ENCOUNTER — Encounter: Payer: Medicare HMO | Admitting: Internal Medicine

## 2021-01-18 ENCOUNTER — Encounter: Payer: Self-pay | Admitting: Internal Medicine

## 2021-01-18 ENCOUNTER — Other Ambulatory Visit: Payer: Self-pay

## 2021-01-18 ENCOUNTER — Ambulatory Visit (INDEPENDENT_AMBULATORY_CARE_PROVIDER_SITE_OTHER): Payer: Medicare HMO | Admitting: Internal Medicine

## 2021-01-18 VITALS — BP 118/80 | HR 68 | Temp 98.0°F | Resp 16 | Ht 66.0 in | Wt 199.5 lb

## 2021-01-18 DIAGNOSIS — E039 Hypothyroidism, unspecified: Secondary | ICD-10-CM

## 2021-01-18 DIAGNOSIS — E785 Hyperlipidemia, unspecified: Secondary | ICD-10-CM | POA: Diagnosis not present

## 2021-01-18 DIAGNOSIS — Z0000 Encounter for general adult medical examination without abnormal findings: Secondary | ICD-10-CM | POA: Diagnosis not present

## 2021-01-18 DIAGNOSIS — I10 Essential (primary) hypertension: Secondary | ICD-10-CM | POA: Diagnosis not present

## 2021-01-18 LAB — COMPREHENSIVE METABOLIC PANEL
ALT: 35 U/L (ref 0–53)
AST: 23 U/L (ref 0–37)
Albumin: 4.4 g/dL (ref 3.5–5.2)
Alkaline Phosphatase: 61 U/L (ref 39–117)
BUN: 13 mg/dL (ref 6–23)
CO2: 30 mEq/L (ref 19–32)
Calcium: 9.7 mg/dL (ref 8.4–10.5)
Chloride: 100 mEq/L (ref 96–112)
Creatinine, Ser: 0.89 mg/dL (ref 0.40–1.50)
GFR: 86.85 mL/min (ref >60.00)
Glucose, Bld: 98 mg/dL (ref 70–99)
Potassium: 4.2 mEq/L (ref 3.5–5.1)
Sodium: 135 mEq/L (ref 135–145)
Total Bilirubin: 0.7 mg/dL (ref 0.2–1.2)
Total Protein: 7.6 g/dL (ref 6.0–8.3)

## 2021-01-18 LAB — LIPID PANEL
Cholesterol: 187 mg/dL (ref 0–200)
HDL: 45.5 mg/dL (ref >39.00)
LDL Cholesterol: 114 mg/dL — ABNORMAL HIGH (ref 0–99)
NonHDL: 141.5
Total CHOL/HDL Ratio: 4
Triglycerides: 136 mg/dL (ref 0.0–149.0)
VLDL: 27.2 mg/dL (ref 0.0–40.0)

## 2021-01-18 LAB — PSA: PSA: 1.7 ng/mL (ref 0.10–4.00)

## 2021-01-18 LAB — TSH: TSH: 0.17 u[IU]/mL — ABNORMAL LOW (ref 0.35–4.50)

## 2021-01-18 NOTE — Assessment & Plan Note (Signed)
Here for CPX HTN: BP today is very good, continue amlodipine, metoprolol. High cholesterol: On atorvastatin, labs. Hypothyroidism: On Synthroid, labs. Ascending aortic aneurysm Saw cardiothoracic surgery 11/17/2020, next visit and MRI 1 year Dermatitis: Episodic pruritus near the right hip, skin is normal at this point, he uses topical steroids from time to time, no change, RF is needed. Priority: Reports he had a thorough ophthalmologic examination, was told he has dry eye, recommend Systane use consistently.  See AVS RTC 6 months

## 2021-01-18 NOTE — Progress Notes (Signed)
Pre visit review using our clinic review tool, if applicable. No additional management support is needed unless otherwise documented below in the visit note. 

## 2021-01-18 NOTE — Patient Instructions (Signed)
Check the  blood pressure 2 or 3 times a month   BP GOAL is between 110/65 and  135/85. If it is consistently higher or lower, let me know  For dry eye: Systane eyedrops during the daytime as needed There is a Systane ointment that you could use at bedtime  GO TO THE LAB : Get the blood work     [place], [place] back for a checkup in 6 months

## 2021-01-18 NOTE — Assessment & Plan Note (Signed)
Zostavax 2013 COVID vaccine x3. Flu shot: Declined  Other vaccinations: Declined consistently. -CCS: Normal colonoscopy December 2012 (had external hemorrhoids).  Refer to GI in few months -Prostate cancer screening:  +FH 2 brothers , DRE wnl 2020, check a PSA -Lifestyle: doing well, he remains very active -Labs: CMP, FLP, TSH,  PSA

## 2021-01-18 NOTE — Progress Notes (Signed)
Subjective:    Patient ID: Dustin Wall, male    DOB: Mar 26, 1951, 70 y.o.   MRN: [id number]  DOS:  01/18/2021 Type of visit - description: CPX Since the last office visit he is doing well. Had a thorough checkup with ophthalmology, still has some puffiness under the eyes, occasional tearing.  Symptoms are mostly at night.   Review of Systems  Other than above, a 14 point review of systems is negative      Past Medical History:  Diagnosis Date  . Ascending aortic aneurysm ([place])    dx 2010 ~ 4.4 cm  . GERD (gastroesophageal reflux disease)   . Hyperlipidemia   . Hypertension   . Hypothyroidism    s/p hyperthyroidism, s/p radioiodine    Past Surgical History:  Procedure Laterality Date  . HEMORRHOID SURGERY      Allergies as of 01/18/2021      Reactions   Gadolinium Derivatives Nausea And Vomiting   Pt was given IV Zofran per Dr Kathlene Cote prior to imaging for n/v   Pt states prev n/v from gad at GI from MRA      Medication List       Accurate as of January 18, 2021  9:38 PM. If you have any questions, ask your nurse or doctor.        STOP taking these medications   pantoprazole 40 MG tablet Commonly known as: PROTONIX Stopped by: Kathlene November, MD     TAKE these medications   amLODipine 10 MG tablet Commonly known as: NORVASC Take 1 tablet (10 mg total) by mouth daily.   atorvastatin 40 MG tablet Commonly known as: LIPITOR Take 1 tablet (40 mg total) by mouth at bedtime.   fluticasone 0.005 % ointment Commonly known as: CUTIVATE Apply 1 application topically 2 (two) times daily.   levothyroxine 137 MCG tablet Commonly known as: SYNTHROID Take 1 tablet (137 mcg total) by mouth daily before breakfast.   metoprolol succinate 100 MG 24 hr tablet Commonly known as: TOPROL-XL Take 1 tablet (100 mg total) by mouth daily. Take with or immediately following a meal.   triamcinolone 0.025 % ointment Commonly known as: KENALOG Apply 1 application topically daily  as needed.          Objective:   Physical Exam BP 118/80 (BP Location: Left Arm, Patient Position: Sitting, Cuff Size: Normal)   Pulse 68   Temp 98 F (36.7 C) (Oral)   Resp 16   Ht 5\' 6"  (1.676 m)   Wt 199 lb 8 oz (90.5 kg)   SpO2 98%   BMI 32.20 kg/m  General: Well developed, NAD, BMI noted Neck: No  thyromegaly  HEENT:  Normocephalic . Face symmetric, atraumatic Lungs:  CTA B Normal respiratory effort, no intercostal retractions, no accessory muscle use. Heart: RRR,  no murmur.  Abdomen:  Not distended, soft, non-tender. No rebound or rigidity.   Lower extremities: no pretibial edema bilaterally  Skin:  He is concerned about the skin around the right hip, it is normal to inspection and palpation. Neurologic:  alert & oriented X3.  Speech normal, gait appropriate for age and unassisted Strength symmetric and appropriate for age.  Psych: Cognition and judgment appear intact.  Cooperative with normal attention span and concentration.  Behavior appropriate. No anxious or depressed appearing.     Assessment      Assessment HTN Hyperlipidemia Hypothyroidism (hypothyroidism  > S/P  radioiodine) Ascending aortic aneurysm DX 2010, 4.5 cm. MRA 08-2017 stable; Dr.  Hendrickson Vitiligo. Acanthosis Nigricans Varicose veins   PLAN: Here for CPX HTN: BP today is very good, continue amlodipine, metoprolol. High cholesterol: On atorvastatin, labs. Hypothyroidism: On Synthroid, labs. Ascending aortic aneurysm Saw cardiothoracic surgery 11/17/2020, next visit and MRI 1 year Dermatitis: Episodic pruritus near the right hip, skin is normal at this point, he uses topical steroids from time to time, no change, RF is needed. Priority: Reports he had a thorough ophthalmologic examination, was told he has dry eye, recommend Systane use consistently.  See AVS RTC 6 months   This visit occurred during the SARS-CoV-2 public health emergency.  Safety protocols were in place,  including screening questions prior to the visit, additional usage of staff PPE, and extensive cleaning of exam room while observing appropriate contact time as indicated for disinfecting solutions.

## 2021-01-22 NOTE — Addendum Note (Signed)
Addended byDamita Dunnings D on: 01/22/2021 08:23 AM   Modules accepted: Orders

## 2021-02-03 ENCOUNTER — Other Ambulatory Visit: Payer: Self-pay | Admitting: Internal Medicine

## 2021-03-08 ENCOUNTER — Other Ambulatory Visit: Payer: Self-pay

## 2021-03-08 ENCOUNTER — Other Ambulatory Visit (INDEPENDENT_AMBULATORY_CARE_PROVIDER_SITE_OTHER): Payer: Medicare HMO

## 2021-03-08 DIAGNOSIS — E039 Hypothyroidism, unspecified: Secondary | ICD-10-CM | POA: Diagnosis not present

## 2021-03-08 LAB — TSH: TSH: 0.3 u[IU]/mL — ABNORMAL LOW (ref 0.35–4.50)

## 2021-03-11 ENCOUNTER — Other Ambulatory Visit: Payer: Self-pay

## 2021-03-11 DIAGNOSIS — E039 Hypothyroidism, unspecified: Secondary | ICD-10-CM

## 2021-03-14 ENCOUNTER — Other Ambulatory Visit: Payer: Self-pay | Admitting: Internal Medicine

## 2021-03-15 ENCOUNTER — Other Ambulatory Visit: Payer: Self-pay

## 2021-03-15 MED ORDER — LEVOTHYROXINE SODIUM 125 MCG PO TABS
125.0000 ug | ORAL_TABLET | Freq: Every day | ORAL | 1 refills | Status: DC
Start: 2021-03-15 — End: 2021-04-07

## 2021-03-15 MED ORDER — METOPROLOL SUCCINATE ER 100 MG PO TB24: 100.0000 mg | ORAL_TABLET | Freq: Every day | ORAL | 1 refills | Status: DC

## 2021-03-15 NOTE — Addendum Note (Signed)
Addended byDamita Dunnings D on: 03/15/2021 09:05 AM   Modules accepted: Orders

## 2021-03-27 ENCOUNTER — Other Ambulatory Visit: Payer: Self-pay | Admitting: Internal Medicine

## 2021-03-27 DIAGNOSIS — I10 Essential (primary) hypertension: Secondary | ICD-10-CM

## 2021-04-07 ENCOUNTER — Other Ambulatory Visit: Payer: Self-pay | Admitting: Internal Medicine

## 2021-04-23 ENCOUNTER — Other Ambulatory Visit: Payer: Medicare HMO

## 2021-04-26 ENCOUNTER — Telehealth: Payer: Self-pay | Admitting: Internal Medicine

## 2021-04-26 ENCOUNTER — Telehealth (INDEPENDENT_AMBULATORY_CARE_PROVIDER_SITE_OTHER): Payer: Medicare HMO | Admitting: Internal Medicine

## 2021-04-26 ENCOUNTER — Other Ambulatory Visit (HOSPITAL_BASED_OUTPATIENT_CLINIC_OR_DEPARTMENT_OTHER): Payer: Self-pay

## 2021-04-26 VITALS — Ht 66.0 in | Wt 194.0 lb

## 2021-04-26 DIAGNOSIS — B349 Viral infection, unspecified: Secondary | ICD-10-CM

## 2021-04-26 MED ORDER — MOLNUPIRAVIR EUA 200MG CAPSULE
4.0000 | ORAL_CAPSULE | Freq: Two times a day (BID) | ORAL | 0 refills | Status: AC
  Filled 2021-04-26: qty 40, 5d supply, fill #0

## 2021-04-26 NOTE — Telephone Encounter (Signed)
Recommend to be checked for COVID himself if not done already, home test okay.  Communicate with me the results immediately. Then schedule a virtual appointing ASAP.

## 2021-04-26 NOTE — Telephone Encounter (Signed)
Patient states his wife has Covid. HE states Paz told him to call him if he becomes sick as well. Patient states he is  Coughing fever chills sore throat. I offered appt but he declined and stated he was to only call back and let him know if he becomes sick. Patient request for Larose Kells to call him back.

## 2021-04-26 NOTE — Telephone Encounter (Signed)
Pt took a in home Covid test over the weekend and it was negative. Today symptoms include sore throat, trouble swallowing, no appetite, and chills. Pt agree schedule  video call at 10:20 am today. -JMA

## 2021-04-26 NOTE — Progress Notes (Signed)
Subjective:    Patient ID: Dustin Wall, male    DOB: August 02, 1951, 70 y.o.   MRN: [id number]  DOS:  04/26/2021 Type of visit - description: Virtual Visit via Telephone    I connected with above mentioned patient  by telephone and verified that I am speaking with the correct person using two identifiers.  THIS ENCOUNTER IS A VIRTUAL VISIT DUE TO COVID-19 - PATIENT WAS NOT SEEN IN THE OFFICE. PATIENT HAS CONSENTED TO VIRTUAL VISIT / TELEMEDICINE VISIT   Location of patient: home  Location of provider: office  Persons participating in the virtual visit: patient, provider   I discussed the limitations, risks, security and privacy concerns of performing an evaluation and management service by telephone and the availability of in person appointments. I also discussed with the patient that there may be a patient responsible charge related to this service. The patient expressed understanding and agreed to proceed.  Acute Symptoms started April 27: Sore throat, chills, decreased appetite, some nausea, mild cough, mild myalgias. His wife was diagnosed with COVID few days before Dustin Wall started to have symptoms. Overall feels about the same today except for the myalgias that are getting better. On April 29 he had at home COVID test: Negative. He has checked his temperature: No fever. Denies chest pain no difficulty breathing. No headaches.   Review of Systems See above   Past Medical History:  Diagnosis Date  . Ascending aortic aneurysm ([place])    dx 2010 ~ 4.4 cm  . GERD (gastroesophageal reflux disease)   . Hyperlipidemia   . Hypertension   . Hypothyroidism    s/p hyperthyroidism, s/p radioiodine    Past Surgical History:  Procedure Laterality Date  . HEMORRHOID SURGERY      Allergies as of 04/26/2021      Reactions   Gadolinium Derivatives Nausea And Vomiting   Pt was given IV Zofran per Dr Kathlene Cote prior to imaging for n/v   Pt states prev n/v from gad at GI from MRA       Medication List       Accurate as of Apr 26, 2021 11:59 PM. If you have any questions, ask your nurse or doctor.        amLODipine 10 MG tablet Commonly known as: NORVASC TAKE 1 TABLET BY MOUTH EVERY DAY   atorvastatin 40 MG tablet Commonly known as: LIPITOR Take 1 tablet (40 mg total) by mouth at bedtime.   fluticasone 0.005 % ointment Commonly known as: CUTIVATE Apply 1 application topically 2 (two) times daily.   levothyroxine 125 MCG tablet Commonly known as: SYNTHROID TAKE 1 TABLET BY MOUTH EVERY DAY   metoprolol succinate 100 MG 24 hr tablet Commonly known as: TOPROL-XL Take 1 tablet (100 mg total) by mouth daily. Take with or immediately following a meal.   Molnupiravir 200 MG Caps Take 4 capsules (800 mg total) by mouth 2 (two) times daily for 5 days. Started by: Kathlene November, MD   triamcinolone 0.025 % ointment Commonly known as: KENALOG Apply 1 application topically daily as needed.          Objective:   Physical Exam Ht 5\' 6"  (1.676 m)   Wt 194 lb (88 kg)   BMI 31.31 kg/m  This is a virtual telephone visit, alert oriented x3, in no distress.  Speaking in complete sentences, no cough or chest congestion noted. Ambulatory BPs or O2 sats: Not available    Assessment       Assessment  HTN Hyperlipidemia Hypothyroidism (hypothyroidism  > S/P  radioiodine) Ascending aortic aneurysm DX 2010, 4.5 cm. MRA 08-2017 stable; Dr. Roxan Hockey Vitiligo. Acanthosis Nigricans Varicose veins   PLAN: Viral syndrome: Symptoms a started 5 days ago, as described above.  He has been exposed to [place] (I diagnosed his wife few days ago).   At home test negative, 20% possibility that this could be false negative. With this in mind my clinical diagnosis is COVID, I am electing to prescribe molnupiravir. I explained the patient that this is again a clinical diagnosis. In addition recommend rest, fluids, continue Tylenol Mucinex DM, call if not gradually better.     I  discussed the assessment and treatment plan with the patient. The patient was provided an opportunity to ask questions and all were answered. The patient agreed with the plan and demonstrated an understanding of the instructions.   The patient was advised to call back or seek an in-person evaluation if the symptoms worsen or if the condition fails to improve as anticipated.  I provided 22  minutes of non-face-to-face time during this encounter.  Kathlene November, MD

## 2021-04-27 NOTE — Assessment & Plan Note (Signed)
Viral syndrome: Symptoms a started 5 days ago, as described above.  He has been exposed to [place] (I diagnosed his wife few days ago).   At home test negative, 20% possibility that this could be false negative. With this in mind my clinical diagnosis is COVID, I am electing to prescribe molnupiravir. I explained the patient that this is again a clinical diagnosis. In addition recommend rest, fluids, continue Tylenol Mucinex DM, call if not gradually better.

## 2021-05-04 ENCOUNTER — Other Ambulatory Visit: Payer: Self-pay | Admitting: Internal Medicine

## 2021-05-10 ENCOUNTER — Other Ambulatory Visit: Payer: Self-pay | Admitting: Family Medicine

## 2021-05-10 ENCOUNTER — Other Ambulatory Visit (INDEPENDENT_AMBULATORY_CARE_PROVIDER_SITE_OTHER): Payer: Medicare HMO

## 2021-05-10 ENCOUNTER — Encounter: Payer: Self-pay | Admitting: Family Medicine

## 2021-05-10 ENCOUNTER — Other Ambulatory Visit: Payer: Self-pay

## 2021-05-10 DIAGNOSIS — E039 Hypothyroidism, unspecified: Secondary | ICD-10-CM | POA: Diagnosis not present

## 2021-05-10 LAB — TSH: TSH: 0.2 u[IU]/mL — ABNORMAL LOW (ref 0.35–4.50)

## 2021-05-10 MED ORDER — LEVOTHYROXINE SODIUM 100 MCG PO TABS: 100.0000 ug | ORAL_TABLET | Freq: Every day | ORAL | 1 refills | Status: DC

## 2021-07-07 ENCOUNTER — Other Ambulatory Visit: Payer: Self-pay | Admitting: Family Medicine

## 2021-07-07 DIAGNOSIS — E039 Hypothyroidism, unspecified: Secondary | ICD-10-CM

## 2021-07-19 ENCOUNTER — Other Ambulatory Visit: Payer: Self-pay

## 2021-07-19 ENCOUNTER — Ambulatory Visit (INDEPENDENT_AMBULATORY_CARE_PROVIDER_SITE_OTHER): Payer: Medicare HMO | Admitting: Internal Medicine

## 2021-07-19 VITALS — BP 119/80 | HR 58 | Temp 97.6°F | Resp 16 | Ht 66.0 in | Wt 201.0 lb

## 2021-07-19 DIAGNOSIS — E039 Hypothyroidism, unspecified: Secondary | ICD-10-CM

## 2021-07-19 DIAGNOSIS — Z7185 Encounter for immunization safety counseling: Secondary | ICD-10-CM | POA: Diagnosis not present

## 2021-07-19 DIAGNOSIS — I10 Essential (primary) hypertension: Secondary | ICD-10-CM

## 2021-07-19 DIAGNOSIS — E785 Hyperlipidemia, unspecified: Secondary | ICD-10-CM

## 2021-07-19 LAB — LIPID PANEL
Cholesterol: 186 mg/dL (ref 0–200)
HDL: 51.2 mg/dL (ref >39.00)
LDL Cholesterol: 115 mg/dL — ABNORMAL HIGH (ref 0–99)
NonHDL: 134.53
Total CHOL/HDL Ratio: 4
Triglycerides: 98 mg/dL (ref 0.0–149.0)
VLDL: 19.6 mg/dL (ref 0.0–40.0)

## 2021-07-19 LAB — TSH: TSH: 7.45 u[IU]/mL — ABNORMAL HIGH (ref 0.35–5.50)

## 2021-07-19 NOTE — Progress Notes (Signed)
Subjective:    Patient ID: Dustin Wall, male    DOB: 29-Aug-1951, 70 y.o.   MRN: [id number]  DOS:  07/19/2021 Type of visit - description: ROV Today with talk about hypothyroidism, high cholesterol and allergies. I also provided vaccine advise.  He had COVID recently, he feels fully recuperated.  Every summer he has on and off watery eyes and sinus congestion. Typically takes Benadryl.  Review of Systems Reports no other concerns  Past Medical History:  Diagnosis Date   Ascending aortic aneurysm ([place])    dx 2010 ~ 4.4 cm   GERD (gastroesophageal reflux disease)    Hyperlipidemia    Hypertension    Hypothyroidism    s/p hyperthyroidism, s/p radioiodine    Past Surgical History:  Procedure Laterality Date   HEMORRHOID SURGERY      Allergies as of 07/19/2021       Reactions   Gadolinium Derivatives Nausea And Vomiting   Pt was given IV Zofran per Dr Kathlene Cote prior to imaging for n/v   Pt states prev n/v from gad at GI from MRA        Medication List        Accurate as of July 19, 2021 11:59 PM. If you have any questions, ask your nurse or doctor.          STOP taking these medications    fluticasone 0.005 % ointment Commonly known as: CUTIVATE Stopped by: Kathlene November, MD   triamcinolone 0.025 % ointment Commonly known as: KENALOG Stopped by: Kathlene November, MD       TAKE these medications    amLODipine 10 MG tablet Commonly known as: NORVASC TAKE 1 TABLET BY MOUTH EVERY DAY   atorvastatin 40 MG tablet Commonly known as: LIPITOR Take 1 tablet (40 mg total) by mouth at bedtime.   levothyroxine 100 MCG tablet Commonly known as: SYNTHROID Take 1 tablet (100 mcg total) by mouth daily before breakfast.   metoprolol succinate 100 MG 24 hr tablet Commonly known as: TOPROL-XL Take 1 tablet (100 mg total) by mouth daily. Take with or immediately following a meal.           Objective:   Physical Exam BP 119/80 (BP Location: Right Arm, Patient  Position: Sitting, Cuff Size: Large)   Pulse (!) 58   Temp 97.6 F (36.4 C) (Oral)   Resp 16   Ht '5\' 6"'$  (1.676 m)   Wt 201 lb (91.2 kg)   SpO2 100%   BMI 32.44 kg/m  General:   Well developed, NAD, BMI noted. HEENT:  Normocephalic . Face symmetric, atraumatic Lungs:  CTA B Normal respiratory effort, no intercostal retractions, no accessory muscle use. Heart: RRR,  no murmur.  Lower extremities: no pretibial edema bilaterally  Skin: Not pale. Not jaundice Neurologic:  alert & oriented X3.  Speech normal, gait appropriate for age and unassisted Psych--  Cognition and judgment appear intact.  Cooperative with normal attention span and concentration.  Behavior appropriate. No anxious or depressed appearing.      Assessment      Assessment HTN Hyperlipidemia Hypothyroidism (hypothyroidism  > S/P  radioiodine) Ascending aortic aneurysm DX 2010, 4.5 cm. MRA 08-2017 stable; Dr. Roxan Hockey Vitiligo. Acanthosis Nigricans Varicose veins  COVID infection  04/2021.  PLAN: HTN: On amlodipine, metoprolol.  BP today is very good, normal ambulatory BPs. Hyperlipidemia: On Lipitor 40 mg, current cardiovascular risk in 10 years is 16.9 %  We will recheck FLP, consider increase atorvastatin or add Zetia.  Hypothyroidism: TSH not well-balanced, recheck today.  RF 90-day supply with results if possible. Seasonal allergies: Currently on Benadryl only, recommend Flonase, Claritin.  See AVS. Vaccines: Recommend COVID booster and consider flu shot. RTC 6 months CPX    This visit occurred during the SARS-CoV-2 public health emergency.  Safety protocols were in place, including screening questions prior to the visit, additional usage of staff PPE, and extensive cleaning of exam room while observing appropriate contact time as indicated for disinfecting solutions.

## 2021-07-19 NOTE — Patient Instructions (Addendum)
Check the  blood pressure   BP GOAL is between 110/65 and  135/85. If it is consistently higher or lower, let me know   For allergies: Flonase 2 sprays on each side of the nose every day. Claritin 10 mg over-the-counter: 1 a day as needed You still can take Benadryl sometimes to help you sleep   GO TO THE LAB : Get the blood work     [place], [place] back for a physical exam by 12-2021

## 2021-07-20 NOTE — Assessment & Plan Note (Signed)
HTN: On amlodipine, metoprolol.  BP today is very good, normal ambulatory BPs. Hyperlipidemia: On Lipitor 40 mg, current cardiovascular risk in 10 years is 16.9 %  We will recheck FLP, consider increase atorvastatin or add Zetia. Hypothyroidism: TSH not well-balanced, recheck today.  RF 90-day supply with results if possible. Seasonal allergies: Currently on Benadryl only, recommend Flonase, Claritin.  See AVS. Vaccines: Recommend COVID booster and consider flu shot. RTC 6 months CPX

## 2021-07-21 ENCOUNTER — Encounter: Payer: Self-pay | Admitting: Internal Medicine

## 2021-07-26 DIAGNOSIS — H04123 Dry eye syndrome of bilateral lacrimal glands: Secondary | ICD-10-CM | POA: Diagnosis not present

## 2021-07-26 DIAGNOSIS — H01115 Allergic dermatitis of left lower eyelid: Secondary | ICD-10-CM | POA: Diagnosis not present

## 2021-07-26 DIAGNOSIS — H1045 Other chronic allergic conjunctivitis: Secondary | ICD-10-CM | POA: Diagnosis not present

## 2021-07-29 MED ORDER — ATORVASTATIN CALCIUM 80 MG PO TABS
80.0000 mg | ORAL_TABLET | Freq: Every day | ORAL | 1 refills | Status: DC
Start: 2021-07-29 — End: 2022-01-31

## 2021-07-29 MED ORDER — LEVOTHYROXINE SODIUM 112 MCG PO TABS: 112.0000 ug | ORAL_TABLET | Freq: Every day | ORAL | 0 refills | Status: DC

## 2021-07-29 NOTE — Addendum Note (Signed)
Addended byDamita Dunnings D on: 07/29/2021 09:48 AM   Modules accepted: Orders

## 2021-07-31 ENCOUNTER — Other Ambulatory Visit: Payer: Self-pay | Admitting: Internal Medicine

## 2021-08-02 MED ORDER — LEVOTHYROXINE SODIUM 112 MCG PO TABS: 112.0000 ug | ORAL_TABLET | Freq: Every day | ORAL | 0 refills | Status: DC

## 2021-08-31 ENCOUNTER — Telehealth: Payer: Self-pay

## 2021-08-31 DIAGNOSIS — Z1211 Encounter for screening for malignant neoplasm of colon: Secondary | ICD-10-CM

## 2021-08-31 NOTE — Telephone Encounter (Signed)
Spoke w/ Pt- informed that referral has been sent.

## 2021-08-31 NOTE — Telephone Encounter (Signed)
Referral placed. Will call Pt back later today to inform that referral was placed.

## 2021-08-31 NOTE — Telephone Encounter (Signed)
Patient called stating he needs colonoscopy in September. Patient is needing order placed GI on [address] [facility]. Patient would like call back when order placed.

## 2021-09-26 ENCOUNTER — Encounter: Payer: Self-pay | Admitting: Internal Medicine

## 2021-09-28 ENCOUNTER — Other Ambulatory Visit (INDEPENDENT_AMBULATORY_CARE_PROVIDER_SITE_OTHER): Payer: Medicare HMO

## 2021-09-28 ENCOUNTER — Other Ambulatory Visit: Payer: Self-pay

## 2021-09-28 DIAGNOSIS — E039 Hypothyroidism, unspecified: Secondary | ICD-10-CM | POA: Diagnosis not present

## 2021-09-28 LAB — TSH: TSH: 5.88 u[IU]/mL — ABNORMAL HIGH (ref 0.35–5.50)

## 2021-10-01 ENCOUNTER — Other Ambulatory Visit: Payer: Self-pay | Admitting: Thoracic Surgery (Cardiothoracic Vascular Surgery)

## 2021-10-01 DIAGNOSIS — I7121 Aneurysm of the ascending aorta, without rupture: Secondary | ICD-10-CM

## 2021-10-01 MED ORDER — LEVOTHYROXINE SODIUM 137 MCG PO TABS: 137.0000 ug | ORAL_TABLET | Freq: Every day | ORAL | 0 refills | Status: DC

## 2021-10-01 NOTE — Addendum Note (Signed)
Addended byDamita Dunnings D on: 10/01/2021 03:16 PM   Modules accepted: Orders

## 2021-10-07 ENCOUNTER — Other Ambulatory Visit: Payer: Self-pay | Admitting: Internal Medicine

## 2021-10-07 ENCOUNTER — Other Ambulatory Visit: Payer: Self-pay | Admitting: Thoracic Surgery (Cardiothoracic Vascular Surgery)

## 2021-10-07 DIAGNOSIS — I7121 Aneurysm of the ascending aorta, without rupture: Secondary | ICD-10-CM

## 2021-10-09 ENCOUNTER — Other Ambulatory Visit: Payer: Self-pay | Admitting: Internal Medicine

## 2021-10-09 DIAGNOSIS — I10 Essential (primary) hypertension: Secondary | ICD-10-CM

## 2021-10-19 ENCOUNTER — Ambulatory Visit: Payer: Medicare HMO | Attending: Internal Medicine

## 2021-10-19 DIAGNOSIS — Z23 Encounter for immunization: Secondary | ICD-10-CM

## 2021-10-19 NOTE — Progress Notes (Signed)
   Covid-19 Vaccination Clinic  Name:  Dustin Wall    MRN: [id number] DOB: May 01, 1951  10/19/2021  Dustin Wall was observed post Covid-19 immunization for 15 minutes without incident. He was provided with Vaccine Information Sheet and instruction to access the V-Safe system.   Dustin Wall was instructed to call 911 with any severe reactions post vaccine: Difficulty breathing  Swelling of face and throat  A fast heartbeat  A bad rash all over body  Dizziness and weakness   Immunizations Administered     Name Date Dose VIS Date Route   Pfizer Covid-19 Vaccine Bivalent Booster 10/19/2021 11:30 AM 0.3 mL 08/25/2021 Intramuscular   Manufacturer: [place]   Lot: [id number]   [place]: [phone]

## 2021-10-25 ENCOUNTER — Other Ambulatory Visit: Payer: Self-pay | Admitting: Internal Medicine

## 2021-11-08 ENCOUNTER — Ambulatory Visit (INDEPENDENT_AMBULATORY_CARE_PROVIDER_SITE_OTHER): Payer: Medicare HMO

## 2021-11-08 VITALS — Ht 66.0 in | Wt 193.0 lb

## 2021-11-08 DIAGNOSIS — Z1211 Encounter for screening for malignant neoplasm of colon: Secondary | ICD-10-CM | POA: Diagnosis not present

## 2021-11-08 DIAGNOSIS — Z0000 Encounter for general adult medical examination without abnormal findings: Secondary | ICD-10-CM

## 2021-11-08 NOTE — Progress Notes (Addendum)
Subjective:   Dustin Wall is a 70 y.o. male who presents for Medicare Annual/Subsequent preventive examination.  I connected with Dustin Wall today by telephone and verified that I am speaking with the correct person using two identifiers. Location patient: home Location provider: work Persons participating in the virtual visit: patient, Marine scientist.    I discussed the limitations, risks, security and privacy concerns of performing an evaluation and management service by telephone and the availability of in person appointments. I also discussed with the patient that there may be a patient responsible charge related to this service. The patient expressed understanding and verbally consented to this telephonic visit.    Interactive audio and video telecommunications were attempted between this provider and patient, however failed, due to patient having technical difficulties OR patient did not have access to video capability.  We continued and completed visit with audio only.  Some vital signs may be absent or patient reported.   Time Spent with patient on telephone encounter: 25 minutes   Review of Systems     Cardiac Risk Factors include: advanced age (>37men, >50 women);male gender;dyslipidemia;hypertension;obesity (BMI >30kg/m2)     Objective:    Today's Vitals   11/08/21 1422  Weight: 193 lb (87.5 kg)  Height: 5\' 6"  (1.676 m)   Body mass index is 31.15 kg/m.  Advanced Directives 11/08/2021 10/11/2019 09/05/2017  Does Patient Have a Medical Advance Directive? No No No  Would patient like information on creating a medical advance directive? No - Patient declined No - Patient declined Yes (MAU/Ambulatory/Procedural Areas - Information given)    Current Medications (verified) Outpatient Encounter Medications as of 11/08/2021  Medication Sig   amLODipine (NORVASC) 10 MG tablet TAKE 1 TABLET BY MOUTH EVERY DAY   atorvastatin (LIPITOR) 80 MG tablet Take 1 tablet (80 mg total) by mouth  at bedtime.   levothyroxine (SYNTHROID) 137 MCG tablet Take 1 tablet (137 mcg total) by mouth daily before breakfast.   metoprolol succinate (TOPROL-XL) 100 MG 24 hr tablet TAKE 1 TABLET BY MOUTH DAILY. TAKE WITH OR IMMEDIATELY FOLLOWING A MEAL.   No facility-administered encounter medications on file as of 11/08/2021.    Allergies (verified) Gadolinium derivatives   History: Past Medical History:  Diagnosis Date   Ascending aortic aneurysm    dx 2010 ~ 4.4 cm   GERD (gastroesophageal reflux disease)    Hyperlipidemia    Hypertension    Hypothyroidism    s/p hyperthyroidism, s/p radioiodine   Past Surgical History:  Procedure Laterality Date   HEMORRHOID SURGERY     Family History  Problem Relation Age of Onset   Prostate cancer Brother    Hypertension Other        several fam members    Prostate cancer Brother    Cancer Father    Colon cancer Neg Hx    CAD Neg Hx    Diabetes Neg Hx    Stroke Neg Hx    Stomach cancer Neg Hx    Rectal cancer Neg Hx    Social History   Socioeconomic History   Marital status: Married    Spouse name: Not on file   Number of children: 0   Years of education: Not on file   Highest education level: Not on file  Occupational History   Occupation: fully retired at age 11, sanitation  Tobacco Use   Smoking status: Never   Smokeless tobacco: Never  Vaping Use   Vaping Use: Never used  Substance and Sexual Activity  Alcohol use: Yes    Comment: beer and wine occasional   Drug use: No   Sexual activity: Yes  Other Topics Concern   Not on file  Social History Narrative   Lives w/ wife   Social Determinants of Health   Financial Resource Strain: Low Risk    Difficulty of Paying Living Expenses: Not hard at all  Food Insecurity: No Food Insecurity   Worried About Charity fundraiser in the Last Year: Never true   [place] in the Last Year: Never true  Transportation Needs: No Transportation Needs   Lack of  Transportation (Medical): No   Lack of Transportation (Non-Medical): No  Physical Activity: Sufficiently Active   Days of Exercise per Week: 5 days   Minutes of Exercise per Session: 60 min  Stress: No Stress Concern Present   Feeling of Stress : Not at all  Social Connections: Socially Isolated   Frequency of Communication with Friends and Family: Once a week   Frequency of Social Gatherings with Friends and Family: Once a week   Attends Religious Services: Never   Marine scientist or Organizations: No   Attends Music therapist: Never   Marital Status: Married    Tobacco Counseling Counseling given: Not Answered   Clinical Intake:  Pre-visit preparation completed: Yes  Pain : No/denies pain     BMI - recorded: 31.15 Nutritional Status: BMI > 30  Obese Nutritional Risks: None Diabetes: No  How often do you need to have someone help you when you read instructions, pamphlets, or other written materials from your doctor or pharmacy?: 1 - Never  Diabetic?No  Interpreter Needed?: No  Information entered by :: Caroleen Hamman LPN   Activities of Daily Living In your present state of health, do you have any difficulty performing the following activities: 11/08/2021 07/19/2021  Hearing? N N  Vision? N N  Difficulty concentrating or making decisions? N N  Walking or climbing stairs? N N  Dressing or bathing? N N  Doing errands, shopping? N N  Preparing Food and eating ? N -  Using the Toilet? N -  In the past six months, have you accidently leaked urine? N -  Do you have problems with loss of bowel control? N -  Managing your Medications? N -  Managing your Finances? N -  Housekeeping or managing your Housekeeping? N -  Some recent data might be hidden    Patient Care Team: Colon Branch, MD as PCP - General (Internal Medicine) Zehr, Laban Emperor, PA-C as Physician Assistant (Gastroenterology) Melrose Nakayama, MD as Consulting Physician  (Cardiothoracic Surgery) Lonia Skinner, MD as Consulting Physician (Ophthalmology)  Indicate any recent Medical Services you may have received from other than [facility] providers in the past year (date may be approximate).     Assessment:   This is a routine wellness examination for Dustin Wall.  Hearing/Vision screen Hearing Screening - Comments:: No issues Vision Screening - Comments:: Last eye exam-03/2021  Dietary issues and exercise activities discussed: Current Exercise Habits: Home exercise routine, Type of exercise: walking;treadmill;strength training/weights (exercise bike), Time (Minutes): 60, Frequency (Times/Week): 5, Weekly Exercise (Minutes/Week): 300, Intensity: Mild   Goals Addressed             This Visit's Progress    Patient Stated       Maintain current healthy lifestyle       Depression Screen PHQ 2/9 Scores 11/08/2021 07/21/2021 01/18/2021 05/26/2020 03/13/2019  09/05/2017 09/07/2016  PHQ - 2 Score 0 0 0 0 0 0 0    Fall Risk Fall Risk  11/08/2021 07/21/2021 01/18/2021 02/25/2020 09/06/2018  Falls in the past year? 0 0 0 0 No  Number falls in past yr: 0 0 0 0 -  Injury with Fall? 0 0 0 0 -  Follow up Falls prevention discussed Falls evaluation completed - Falls evaluation completed -    FALL RISK PREVENTION PERTAINING TO THE HOME:  Any stairs in or around the home? Yes  If so, are there any without handrails? No  Home free of loose throw rugs in walkways, pet beds, electrical cords, etc? Yes  Adequate lighting in your home to reduce risk of falls? Yes   ASSISTIVE DEVICES UTILIZED TO PREVENT FALLS:  Life alert? No  Use of a cane, walker or w/c? No  Grab bars in the bathroom? No  Shower chair or bench in shower? Yes  Elevated toilet seat or a handicapped toilet? No   TIMED UP AND GO:  Was the test performed? No . Phone visit   Cognitive Function:Normal cognitive status assessed by this Nurse Health Advisor. No abnormalities found.           Immunizations Immunization History  Administered Date(s) Administered   Fluad Quad(high Dose 65+) 09/11/2019   PFIZER(Purple Top)SARS-COV-2 Vaccination 02/20/2020, 03/11/2020, 11/02/2020   Pfizer Covid-19 Vaccine Bivalent Booster 20yrs & up 10/19/2021   Zoster, Live 12/27/2011    TDAP status: Due, Education has been provided regarding the importance of this vaccine. Advised may receive this vaccine at local pharmacy or Health Dept. Aware to provide a copy of the vaccination record if obtained from local pharmacy or Health Dept. Verbalized acceptance and understanding.  Flu Vaccine status: Due, Education has been provided regarding the importance of this vaccine. Advised may receive this vaccine at local pharmacy or Health Dept. Aware to provide a copy of the vaccination record if obtained from local pharmacy or Health Dept. Verbalized acceptance and understanding.  Pneumococcal vaccine status: Due, Education has been provided regarding the importance of this vaccine. Advised may receive this vaccine at local pharmacy or Health Dept. Aware to provide a copy of the vaccination record if obtained from local pharmacy or Health Dept. Verbalized acceptance and understanding.  Covid-19 vaccine status: Completed vaccines  Qualifies for Shingles Vaccine? Yes   Zostavax completed Yes   Shingrix Completed?: No.    Education has been provided regarding the importance of this vaccine. Patient has been advised to call insurance company to determine out of pocket expense if they have not yet received this vaccine. Advised may also receive vaccine at local pharmacy or Health Dept. Verbalized acceptance and understanding.  Screening Tests Health Maintenance  Topic Date Due   TETANUS/TDAP  Never done   Zoster Vaccines- Shingrix (1 of 2) Never done   Pneumonia Vaccine 49+ Years old (1 - PCV) Never done   INFLUENZA VACCINE  07/26/2021   COLONOSCOPY (Pts 45-37yrs Insurance coverage will need to be  confirmed)  04/05/2022 (Originally 12/26/2020)   COVID-19 Vaccine  Completed   Hepatitis C Screening  Completed   HPV VACCINES  Aged Out    Health Maintenance  Health Maintenance Due  Topic Date Due   TETANUS/TDAP  Never done   Zoster Vaccines- Shingrix (1 of 2) Never done   Pneumonia Vaccine 70+ Years old (1 - PCV) Never done   INFLUENZA VACCINE  07/26/2021    Colorectal cancer screening: Referral to GI  placed today to [facility]. Pt aware the office will call re: appt. Referral was previously placed back in Sept to [place]. Patient states he has never heard anything from the referral & also states he has never heard of [facility] & wants to go to [facility] where he has been seen in the past.  Lung Cancer Screening: (Low Dose CT Chest recommended if Age 55-80 years, 30 pack-year currently smoking OR have quit w/in 15years.) does not qualify.     Additional Screening:  Hepatitis C Screening: Completed 05/08/2017  Vision Screening: Recommended annual ophthalmology exams for early detection of glaucoma and other disorders of the eye. Is the patient up to date with their annual eye exam?  Yes  Who is the provider or what is the name of the office in which the patient attends annual eye exams? Pt unsure of name   Dental Screening: Recommended annual dental exams for proper oral hygiene  Community Resource Referral / Chronic Care Management: CRR required this visit?  No   CCM required this visit?  No      Plan:     I have personally reviewed and noted the following in the patient's chart:   Medical and social history Use of alcohol, tobacco or illicit drugs  Current medications and supplements including opioid prescriptions. Patient is not currently taking opioid prescriptions. Functional ability and status Nutritional status Physical activity Advanced directives List of other physicians Hospitalizations, surgeries, and ER visits in previous 12  months Vitals Screenings to include cognitive, depression, and falls Referrals and appointments  In addition, I have reviewed and discussed with patient certain preventive protocols, quality metrics, and best practice recommendations. A written personalized care plan for preventive services as well as general preventive health recommendations were provided to patient.   Due to this being a telephonic visit, the after visit summary with patients personalized plan was offered to patient via mail or my-chart.  Patient would like to access on my-chart.   Marta Antu, LPN   [id number]  Nurse Health Advisor  Nurse Notes: None  I have reviewed and agree with Health Coaches documentation.  Kathlene November, MD

## 2021-11-08 NOTE — Patient Instructions (Signed)
Dustin Wall , Thank you for taking time to complete your Medicare Wellness Visit. I appreciate your ongoing commitment to your health goals. Please review the following plan we discussed and let me know if I can assist you in the future.   Screening recommendations/referrals: Colonoscopy: Referral ordered today to [facility]. Recommended yearly ophthalmology/optometry visit for glaucoma screening and checkup Recommended yearly dental visit for hygiene and checkup  Vaccinations: Influenza vaccine: Due-May obtain vaccine at our office or your local pharmacy. Pneumococcal vaccine: Due-May obtain vaccine at our office or your local pharmacy. Tdap vaccine: Discuss with pharmacy Shingles vaccine: Discuss with pharmacy   Covid-19: Up to date  Advanced directives: Information mailed today  Conditions/risks identified: See problem list  Next appointment: Follow up in one year for your annual wellness visit.   Preventive Care 23 Years and Older, Male Preventive care refers to lifestyle choices and visits with your health care provider that can promote health and wellness. What does preventive care include? A yearly physical exam. This is also called an annual well check. Dental exams once or twice a year. Routine eye exams. Ask your health care provider how often you should have your eyes checked. Personal lifestyle choices, including: Daily care of your teeth and gums. Regular physical activity. Eating a healthy diet. Avoiding tobacco and drug use. Limiting alcohol use. Practicing safe sex. Taking low doses of aspirin every day. Taking vitamin and mineral supplements as recommended by your health care provider. What happens during an annual well check? The services and screenings done by your health care provider during your annual well check will depend on your age, overall health, lifestyle risk factors, and family history of disease. Counseling  Your health care provider may ask  you questions about your: Alcohol use. Tobacco use. Drug use. Emotional well-being. Home and relationship well-being. Sexual activity. Eating habits. History of falls. Memory and ability to understand (cognition). Work and work Statistician. Screening  You may have the following tests or measurements: Height, weight, and BMI. Blood pressure. Lipid and cholesterol levels. These may be checked every 5 years, or more frequently if you are over 58 years old. Skin check. Lung cancer screening. You may have this screening every year starting at age 24 if you have a 30-pack-year history of smoking and currently smoke or have quit within the past 15 years. Fecal occult blood test (FOBT) of the stool. You may have this test every year starting at age 68. Flexible sigmoidoscopy or colonoscopy. You may have a sigmoidoscopy every 5 years or a colonoscopy every 10 years starting at age 77. Prostate cancer screening. Recommendations will vary depending on your family history and other risks. Hepatitis C blood test. Hepatitis B blood test. Sexually transmitted disease (STD) testing. Diabetes screening. This is done by checking your blood sugar (glucose) after you have not eaten for a while (fasting). You may have this done every 1-3 years. Abdominal aortic aneurysm (AAA) screening. You may need this if you are a current or former smoker. Osteoporosis. You may be screened starting at age 19 if you are at high risk. Talk with your health care provider about your test results, treatment options, and if necessary, the need for more tests. Vaccines  Your health care provider may recommend certain vaccines, such as: Influenza vaccine. This is recommended every year. Tetanus, diphtheria, and acellular pertussis (Tdap, Td) vaccine. You may need a Td booster every 10 years. Zoster vaccine. You may need this after age 35. Pneumococcal 13-valent conjugate (  PCV13) vaccine. One dose is recommended after age  52. Pneumococcal polysaccharide (PPSV23) vaccine. One dose is recommended after age 22. Talk to your health care provider about which screenings and vaccines you need and how often you need them. This information is not intended to replace advice given to you by your health care provider. Make sure you discuss any questions you have with your health care provider. Document Released: 01/08/2016 Document Revised: 08/31/2016 Document Reviewed: 10/13/2015 Elsevier Interactive Patient Education  2017 [employer] Prevention in the Home Falls can cause injuries. They can happen to people of all ages. There are many things you can do to make your home safe and to help prevent falls. What can I do on the outside of my home? Regularly fix the edges of walkways and driveways and fix any cracks. Remove anything that might make you trip as you walk through a door, such as a raised step or threshold. Trim any bushes or trees on the path to your home. Use bright outdoor lighting. Clear any walking paths of anything that might make someone trip, such as rocks or tools. Regularly check to see if handrails are loose or broken. Make sure that both sides of any steps have handrails. Any raised decks and porches should have guardrails on the edges. Have any leaves, snow, or ice cleared regularly. Use sand or salt on walking paths during winter. Clean up any spills in your garage right away. This includes oil or grease spills. What can I do in the bathroom? Use night lights. Install grab bars by the toilet and in the tub and shower. Do not use towel bars as grab bars. Use non-skid mats or decals in the tub or shower. If you need to sit down in the shower, use a plastic, non-slip stool. Keep the floor dry. Clean up any water that spills on the floor as soon as it happens. Remove soap buildup in the tub or shower regularly. Attach bath mats securely with double-sided non-slip rug tape. Do not have throw  rugs and other things on the floor that can make you trip. What can I do in the bedroom? Use night lights. Make sure that you have a light by your bed that is easy to reach. Do not use any sheets or blankets that are too big for your bed. They should not hang down onto the floor. Have a firm chair that has side arms. You can use this for support while you get dressed. Do not have throw rugs and other things on the floor that can make you trip. What can I do in the kitchen? Clean up any spills right away. Avoid walking on wet floors. Keep items that you use a lot in easy-to-reach places. If you need to reach something above you, use a strong step stool that has a grab bar. Keep electrical cords out of the way. Do not use floor polish or wax that makes floors slippery. If you must use wax, use non-skid floor wax. Do not have throw rugs and other things on the floor that can make you trip. What can I do with my stairs? Do not leave any items on the stairs. Make sure that there are handrails on both sides of the stairs and use them. Fix handrails that are broken or loose. Make sure that handrails are as long as the stairways. Check any carpeting to make sure that it is firmly attached to the stairs. Fix any carpet that is  loose or worn. Avoid having throw rugs at the top or bottom of the stairs. If you do have throw rugs, attach them to the floor with carpet tape. Make sure that you have a light switch at the top of the stairs and the bottom of the stairs. If you do not have them, ask someone to add them for you. What else can I do to help prevent falls? Wear shoes that: Do not have high heels. Have rubber bottoms. Are comfortable and fit you well. Are closed at the toe. Do not wear sandals. If you use a stepladder: Make sure that it is fully opened. Do not climb a closed stepladder. Make sure that both sides of the stepladder are locked into place. Ask someone to hold it for you, if  possible. Clearly mark and make sure that you can see: Any grab bars or handrails. First and last steps. Where the edge of each step is. Use tools that help you move around (mobility aids) if they are needed. These include: Canes. Walkers. Scooters. Crutches. Turn on the lights when you go into a dark area. Replace any light bulbs as soon as they burn out. Set up your furniture so you have a clear path. Avoid moving your furniture around. If any of your floors are uneven, fix them. If there are any pets around you, be aware of where they are. Review your medicines with your doctor. Some medicines can make you feel dizzy. This can increase your chance of falling. Ask your doctor what other things that you can do to help prevent falls. This information is not intended to replace advice given to you by your health care provider. Make sure you discuss any questions you have with your health care provider. Document Released: 10/08/2009 Document Revised: 05/19/2016 Document Reviewed: 01/16/2015 Elsevier Interactive Patient Education  2017 [employer].

## 2021-11-09 ENCOUNTER — Other Ambulatory Visit (INDEPENDENT_AMBULATORY_CARE_PROVIDER_SITE_OTHER): Payer: Medicare HMO

## 2021-11-09 ENCOUNTER — Other Ambulatory Visit: Payer: Self-pay

## 2021-11-09 DIAGNOSIS — E039 Hypothyroidism, unspecified: Secondary | ICD-10-CM | POA: Diagnosis not present

## 2021-11-09 LAB — TSH: TSH: 0.11 u[IU]/mL — ABNORMAL LOW (ref 0.35–5.50)

## 2021-11-11 ENCOUNTER — Ambulatory Visit
Admission: RE | Admit: 2021-11-11 | Discharge: 2021-11-11 | Disposition: A | Discharge status: Home / Self Care | Payer: Medicare HMO | Source: Ambulatory Visit | Attending: Thoracic Surgery (Cardiothoracic Vascular Surgery) | Admitting: Thoracic Surgery (Cardiothoracic Vascular Surgery)

## 2021-11-11 ENCOUNTER — Other Ambulatory Visit: Payer: Self-pay

## 2021-11-11 ENCOUNTER — Ambulatory Visit: Payer: Medicare HMO | Admitting: Physician Assistant

## 2021-11-11 ENCOUNTER — Ambulatory Visit: Payer: Medicare HMO

## 2021-11-11 VITALS — BP 115/69 | HR 80 | Resp 20 | Ht 66.0 in | Wt 193.0 lb

## 2021-11-11 DIAGNOSIS — I7121 Aneurysm of the ascending aorta, without rupture: Secondary | ICD-10-CM | POA: Diagnosis not present

## 2021-11-11 DIAGNOSIS — I700 Atherosclerosis of aorta: Secondary | ICD-10-CM | POA: Diagnosis not present

## 2021-11-11 DIAGNOSIS — I712 Thoracic aortic aneurysm, without rupture, unspecified: Secondary | ICD-10-CM | POA: Diagnosis not present

## 2021-11-11 MED ORDER — LEVOTHYROXINE SODIUM 125 MCG PO TABS: 125.0000 ug | ORAL_TABLET | Freq: Every day | ORAL | 0 refills | Status: DC

## 2021-11-11 NOTE — Progress Notes (Signed)
HPI: [name] is a 70 year old male with history of hypertension, dyslipidemia, aortic atherosclerosis, hypothyroidism, reactive  , Arthritis, and ascending aortic aneurysm.  He has been followed by our practice for a few years for surveillance.  The aneurysm was originally diagnosed in 2010, was 4.4 cm.  He was seen in our office by Dr. Roxan Hockey 4.7 cm.  His blood pressure has historically been well controlled.  He was advised to have follow-up imaging of his aorta in 6 months but he declined. Returns today for evaluation after a noncontrast chest CT. He denies any chest pain or any changes in peripheral circulation.     Current Outpatient Medications  Medication Sig Dispense Refill   amLODipine (NORVASC) 10 MG tablet TAKE 1 TABLET BY MOUTH EVERY DAY 90 tablet 1   atorvastatin (LIPITOR) 80 MG tablet Take 1 tablet (80 mg total) by mouth at bedtime. 90 tablet 1   levothyroxine (SYNTHROID) 125 MCG tablet Take 1 tablet (125 mcg total) by mouth daily before breakfast. 90 tablet 0   metoprolol succinate (TOPROL-XL) 100 MG 24 hr tablet TAKE 1 TABLET BY MOUTH DAILY. TAKE WITH OR IMMEDIATELY FOLLOWING A MEAL. 90 tablet 1   No current facility-administered medications for this visit.     Physical Exam: Vital signs BP: 115/69 HR: 80 RR: 20 SaO2: 95%  -[name] is a well-developed 70 year old male who appears his stated age. Heart: Regular rate and rhythm, no murmur Chest: Breath sounds are clear to auscultation Extremities: All well perfused, no peripheral edema. Neuro: Grossly intact   Diagnostic Tests:  CLINICAL DATA:  Thoracic aortic aneurysm.   EXAM: CT CHEST WITHOUT CONTRAST   TECHNIQUE: Multidetector CT imaging of the chest was performed following the standard protocol without IV contrast.   COMPARISON:  November 05, 2020.   FINDINGS: Cardiovascular: 4.7 cm ascending thoracic aortic aneurysm is noted which is unchanged compared to prior exam. Atherosclerosis  of thoracic aorta is noted. Normal cardiac size. No pericardial effusion.   Mediastinum/Nodes: No enlarged mediastinal or axillary lymph nodes. Thyroid gland, trachea, and esophagus demonstrate no significant findings.   Lungs/Pleura: Lungs are clear. No pleural effusion or pneumothorax.   Upper Abdomen: No acute abnormality.   Musculoskeletal: No chest wall mass or suspicious bone lesions identified.   IMPRESSION: Grossly stable 4.7 cm ascending thoracic aortic aneurysm. Ascending thoracic aortic aneurysm. Recommend semi-annual imaging followup by CTA or MRA and referral to cardiothoracic surgery if not already obtained. This recommendation follows 2010 ACCF/AHA/AATS/ACR/ASA/SCA/SCAI/SIR/STS/SVM Guidelines for the Diagnosis and Management of Patients With Thoracic Aortic Disease. Circulation. 2010; 121: X735-H299. Aortic aneurysm NOS (ICD10-I71.9).   Aortic Atherosclerosis (ICD10-I70.0).     Electronically Signed   By: Marijo Conception M.D.   On: 11/11/2021 10:47  Impression: 70 year old male with stable 4.7 cm ascending aortic aneurysm that is asymptomatic.  He tells me he has a close watch on his blood pressure and it appears well controlled at 115/69 in the office today.  Encouraged him to keep a close watch on his blood pressure at home.  I again suggested he follow-up in 6 months for repeat imaging but he again declines.  Plan: We will schedule noncontrast chest CT for re-evaluation of his ascending aortic aneurysm in 1 year.   Antony Odea, PA-C [facility] [phone]

## 2021-11-11 NOTE — Addendum Note (Signed)
Addended byDamita Dunnings D on: 11/11/2021 08:51 AM   Modules accepted: Orders

## 2021-11-11 NOTE — Patient Instructions (Signed)
Continue to monitor your blood pressure and maintain BP less than 140/90.  Follow-up in 1 year for repeat noncontrast chest CT to evaluate the ascending aortic aneurysm

## 2021-11-12 ENCOUNTER — Other Ambulatory Visit (HOSPITAL_BASED_OUTPATIENT_CLINIC_OR_DEPARTMENT_OTHER): Payer: Self-pay

## 2021-11-12 MED ORDER — PFIZER COVID-19 VAC BIVALENT 30 MCG/0.3ML IM SUSP
INTRAMUSCULAR | 0 refills | Status: DC
  Filled 2021-11-12: qty 0.3, 1d supply, fill #0

## 2021-12-29 ENCOUNTER — Other Ambulatory Visit: Payer: Self-pay | Admitting: Internal Medicine

## 2021-12-29 MED ORDER — LEVOTHYROXINE SODIUM 125 MCG PO TABS: 125.0000 ug | ORAL_TABLET | Freq: Every day | ORAL | 0 refills | Status: DC

## 2022-01-07 ENCOUNTER — Other Ambulatory Visit: Payer: Self-pay | Admitting: Internal Medicine

## 2022-01-07 DIAGNOSIS — I10 Essential (primary) hypertension: Secondary | ICD-10-CM

## 2022-01-12 ENCOUNTER — Ambulatory Visit: Payer: Medicare HMO | Admitting: Physician Assistant

## 2022-01-21 IMAGING — CT CT CHEST W/O CM
2 of 4 series · 11 of 36 positions shown, 13 images · non-contrast
Comparison: MR 10/10/2019 CT 08/05/2014, 10/08/2014

CLINICAL DATA: 69-year-old male with a history of thoracic aortic
aneurysm

EXAM:
CT CHEST WITHOUT CONTRAST
TECHNIQUE: Multidetector CT imaging of the chest was performed following the
standard protocol without IV contrast.

[Series 2: chest 2.00 br40 s3 · axial · 0.59mm/px · z∈[+1587,+1853]mm · 8 of 157 slices shown, 10 images (1 of 2)]
[im 12/157  mediastinal]
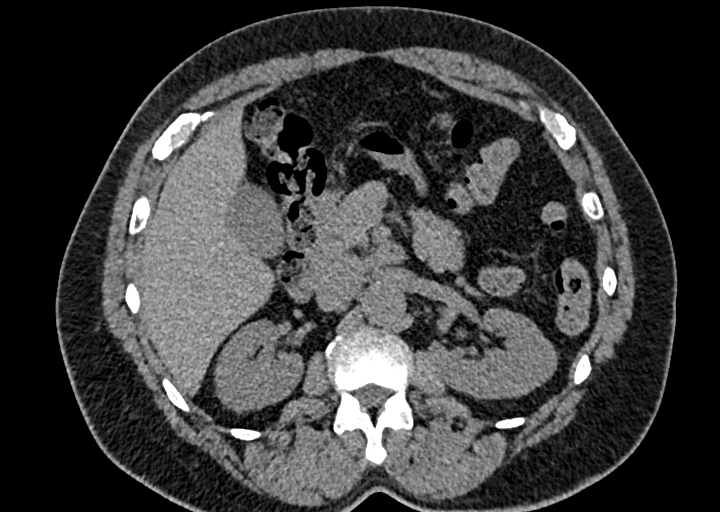
[im 12/157  lung]
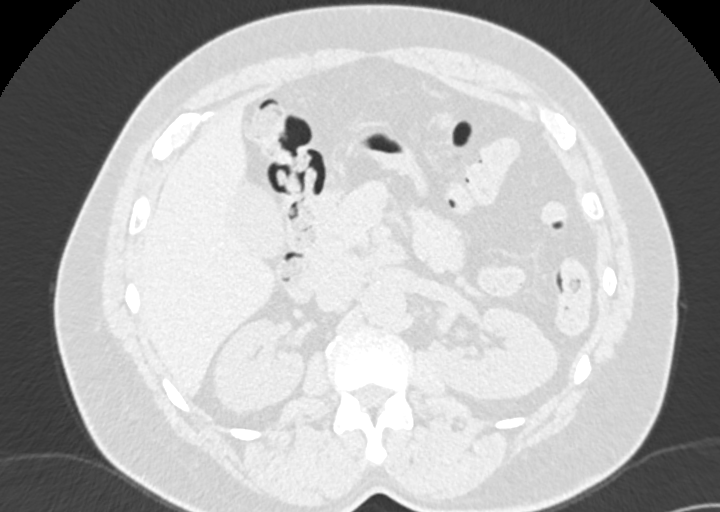
[im 34/157  lung]
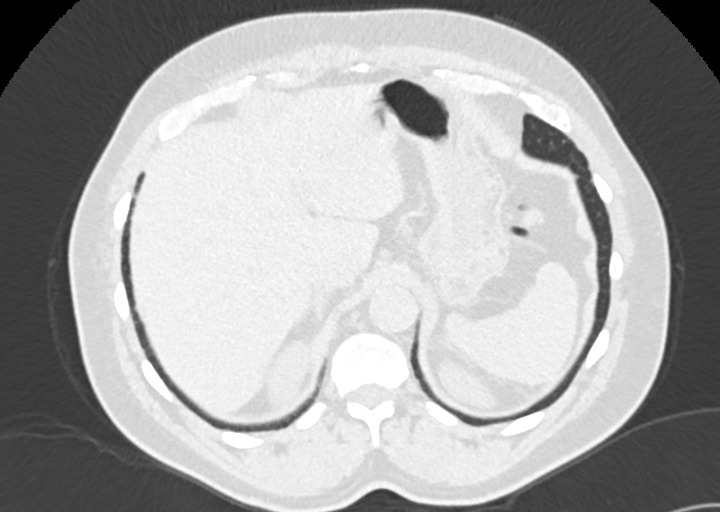
[im 56/157  lung]
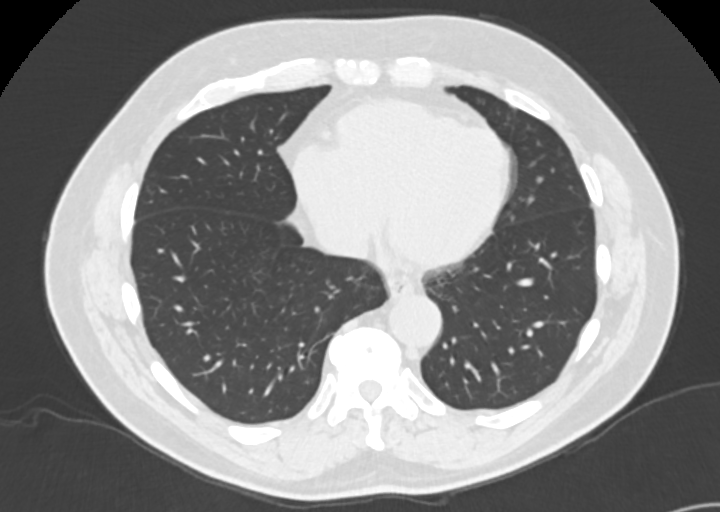
[im 67/157  lung]
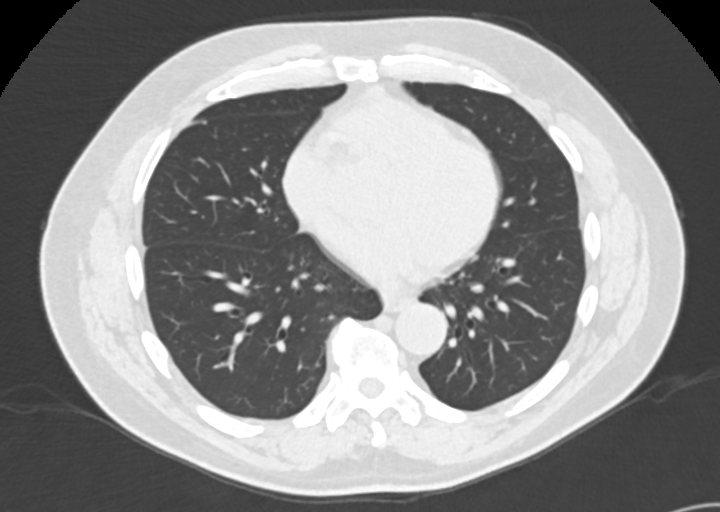
[im 90/157  mediastinal]
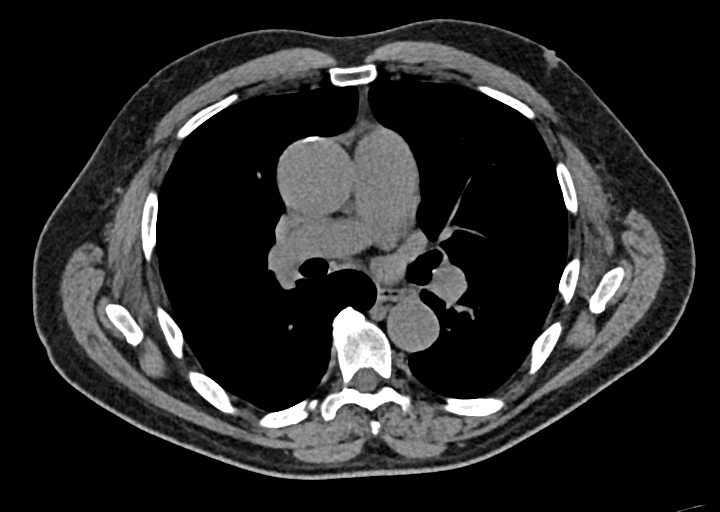
[im 90/157  lung]
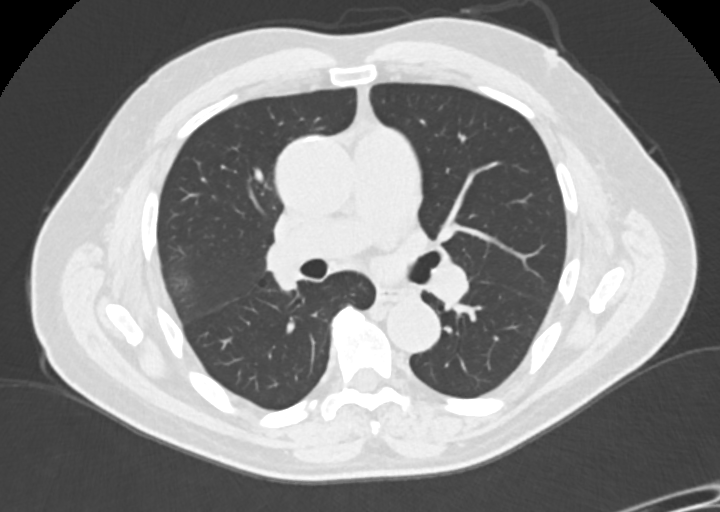
[im 101/157  lung]
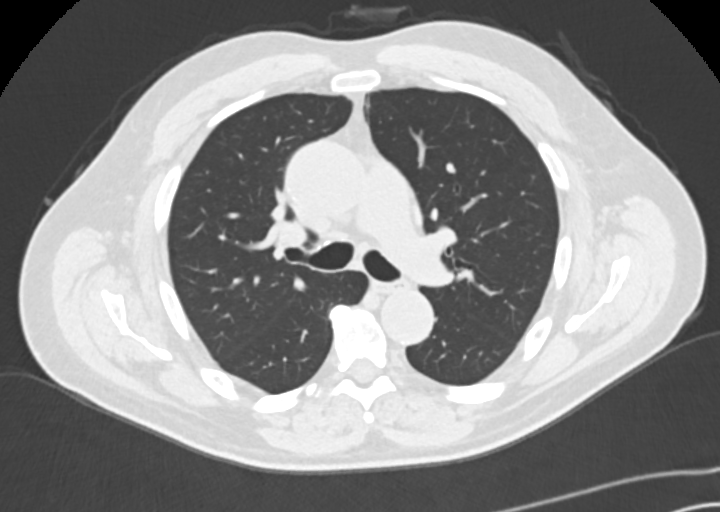
[im 123/157  lung]
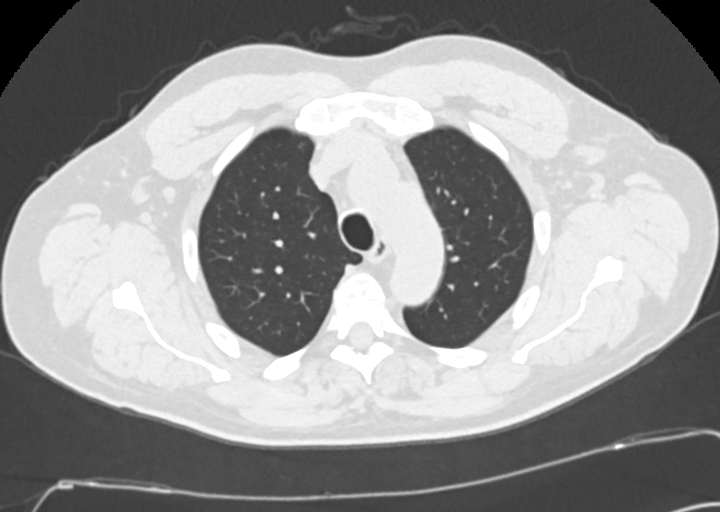
[im 145/157  lung]
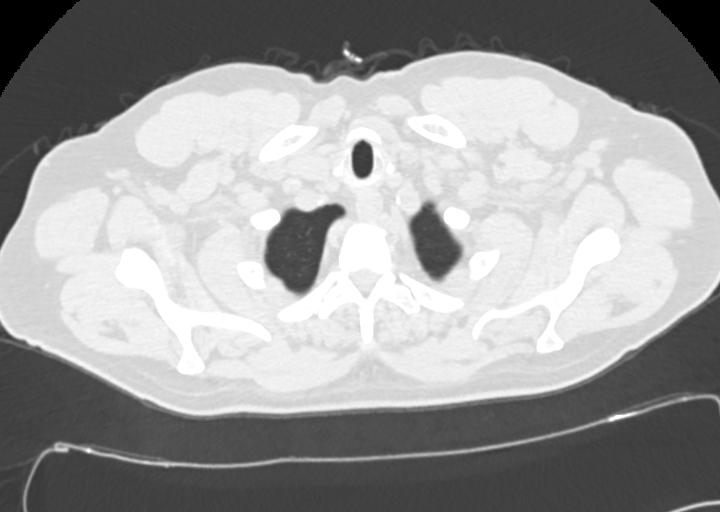

[Series 4: chest 2.00 br40 s3 · coronal · 0.62mm/px · 3 of 150 slices shown (2 of 2)]
[im 30/150  lung]
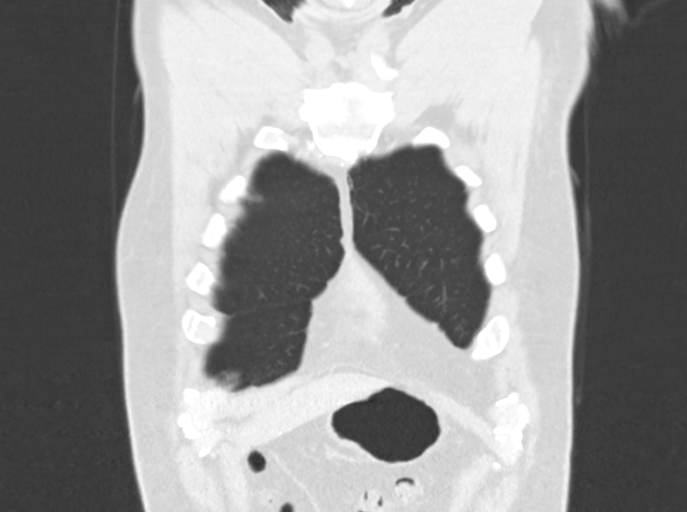
[im 60/150  lung]
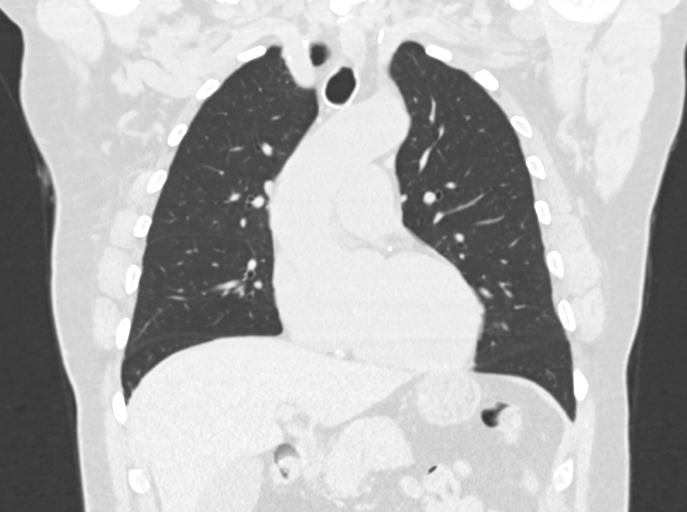
[im 90/150  lung]
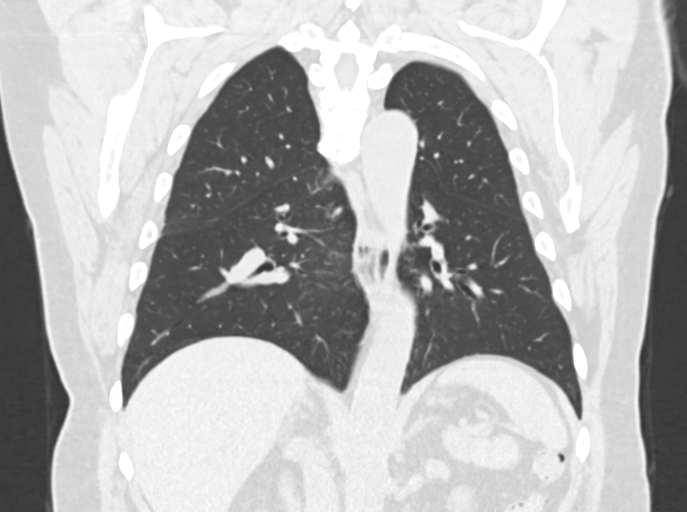

[11 of 36 positions shown; findings below may reference images not displayed]

FINDINGS: Cardiovascular: Heart size unchanged. No pericardial
fluid/thickening. Calcifications of the left anterior descending and
right coronary arteries.

Minimal aortic valve calcifications.

Greatest diameter of the ascending aorta estimated 47 mm. Greatest
diameter of the ascending aorta on the prior MR was measured 46 mm.

Atherosclerotic changes of the aortic arch.

Mediastinum/Nodes: Unremarkable thoracic inlet. No mediastinal
adenopathy. Unremarkable appearance of the thoracic esophagus. Small
hiatal hernia.

Lungs/Pleura: No pneumothorax. No pleural effusion. No confluent
airspace disease.

Upper Abdomen:  No acute finding of the upper abdomen

Musculoskeletal: No acute displaced fracture. Degenerative changes
of the spine with multiple endplate changes similar to the
comparison CT of 9401. No bony canal narrowing.
IMPRESSION: Similar diameter of the ascending aorta, estimated 4.7 cm on the
current CT. Aortic aneurysm NOS ([id number].E).

Aortic atherosclerosis and coronary artery disease. Aortic
Atherosclerosis ([id number].P).

## 2022-01-24 ENCOUNTER — Encounter: Payer: Medicare HMO | Admitting: Internal Medicine

## 2022-01-31 ENCOUNTER — Other Ambulatory Visit: Payer: Self-pay | Admitting: Internal Medicine

## 2022-02-11 ENCOUNTER — Encounter: Payer: Self-pay | Admitting: Internal Medicine

## 2022-02-11 ENCOUNTER — Ambulatory Visit (INDEPENDENT_AMBULATORY_CARE_PROVIDER_SITE_OTHER): Payer: Medicare HMO | Admitting: Internal Medicine

## 2022-02-11 VITALS — BP 126/80 | HR 63 | Temp 97.9°F | Resp 16 | Ht 66.0 in | Wt 200.0 lb

## 2022-02-11 DIAGNOSIS — R399 Unspecified symptoms and signs involving the genitourinary system: Secondary | ICD-10-CM | POA: Diagnosis not present

## 2022-02-11 DIAGNOSIS — R202 Paresthesia of skin: Secondary | ICD-10-CM

## 2022-02-11 DIAGNOSIS — Z1211 Encounter for screening for malignant neoplasm of colon: Secondary | ICD-10-CM

## 2022-02-11 DIAGNOSIS — E785 Hyperlipidemia, unspecified: Secondary | ICD-10-CM | POA: Diagnosis not present

## 2022-02-11 DIAGNOSIS — E039 Hypothyroidism, unspecified: Secondary | ICD-10-CM | POA: Diagnosis not present

## 2022-02-11 DIAGNOSIS — Z0000 Encounter for general adult medical examination without abnormal findings: Secondary | ICD-10-CM

## 2022-02-11 DIAGNOSIS — I10 Essential (primary) hypertension: Secondary | ICD-10-CM

## 2022-02-11 DIAGNOSIS — L309 Dermatitis, unspecified: Secondary | ICD-10-CM | POA: Diagnosis not present

## 2022-02-11 DIAGNOSIS — Z0001 Encounter for general adult medical examination with abnormal findings: Secondary | ICD-10-CM

## 2022-02-11 MED ORDER — TRIAMCINOLONE ACETONIDE 0.025 % EX OINT: 1.0000 "application " | TOPICAL_OINTMENT | Freq: Every day | CUTANEOUS | 1 refills | Status: DC | PRN

## 2022-02-11 MED ORDER — EZETIMIBE 10 MG PO TABS: 10.0000 mg | ORAL_TABLET | Freq: Every day | ORAL | 3 refills | Status: DC

## 2022-02-11 NOTE — Progress Notes (Signed)
Subjective:    Patient ID: Dustin Wall, male    DOB: 04/06/1951, 71 y.o.   MRN: [id number]  DOS:  02/11/2022 Type of visit - description: CPX  Chronic medical problems and other sxs were addressed as well.  Reports possible side effect related to atorvastatin, for the last 2 months has developed an lower abdominal pain shortly after he takes atorvastatin.  No sxs if he skips it. He denies diarrhea.  The pain is described as burning.  Also, has developed some nocturia, he has to go to urinate several times at night, denies any difficulty urinating, no dysuria or gross hematuria. "Sometimes urinate a lot and sometimes little"  Also, several months history of burning feeling, worse at night, located at the right pretibial area. Symptoms were not preceded by rash or blisters. Denies any back pain  Review of Systems  Other than above, a 14 point review of systems is negative     Past Medical History:  Diagnosis Date   Ascending aortic aneurysm    dx 2010 ~ 4.4 cm   GERD (gastroesophageal reflux disease)    Hyperlipidemia    Hypertension    Hypothyroidism    s/p hyperthyroidism, s/p radioiodine    Past Surgical History:  Procedure Laterality Date   HEMORRHOID SURGERY     Social History   Socioeconomic History   Marital status: Married    Spouse name: Not on file   Number of children: 0   Years of education: Not on file   Highest education level: Not on file  Occupational History   Occupation: fully retired at age 56, sanitation  Tobacco Use   Smoking status: Never   Smokeless tobacco: Never  Vaping Use   Vaping Use: Never used  Substance and Sexual Activity   Alcohol use: Yes    Comment: beer and wine occasional   Drug use: No   Sexual activity: Yes  Other Topics Concern   Not on file  Social History Narrative   Lives w/ wife   Social Determinants of Health   Financial Resource Strain: Low Risk    Difficulty of Paying Living Expenses: Not hard at all   Food Insecurity: No Food Insecurity   Worried About Charity fundraiser in the Last Year: Never true   [place] in the Last Year: Never true  Transportation Needs: No Transportation Needs   Lack of Transportation (Medical): No   Lack of Transportation (Non-Medical): No  Physical Activity: Sufficiently Active   Days of Exercise per Week: 5 days   Minutes of Exercise per Session: 60 min  Stress: No Stress Concern Present   Feeling of Stress : Not at all  Social Connections: Socially Isolated   Frequency of Communication with Friends and Family: Once a week   Frequency of Social Gatherings with Friends and Family: Once a week   Attends Religious Services: Never   Marine scientist or Organizations: No   Attends Music therapist: Never   Marital Status: Married  Human resources officer Violence: Not At Risk   Fear of Current or Ex-Partner: No   Emotionally Abused: No   Physically Abused: No   Sexually Abused: No    Current Outpatient Medications  Medication Instructions   amLODipine (NORVASC) 10 MG tablet TAKE 1 TABLET BY MOUTH EVERY DAY   atorvastatin (LIPITOR) 40 mg, Oral, Daily at bedtime   ezetimibe (ZETIA) 10 mg, Oral, Daily   levothyroxine (SYNTHROID) 125 mcg, Oral, Daily  before breakfast   metoprolol succinate (TOPROL-XL) 100 MG 24 hr tablet TAKE 1 TABLET BY MOUTH EVERY DAY WITH OR IMMEDIATELY FOLLOWING A MEAL   triamcinolone (KENALOG) 0.025 % ointment 1 application, Topical, Daily PRN       Objective:   Physical Exam BP 126/80 (BP Location: Right Arm, Patient Position: Sitting, Cuff Size: Small)    Pulse 63    Temp 97.9 F (36.6 C) (Oral)    Resp 16    Ht 5\' 6"  (1.676 m)    Wt 200 lb (90.7 kg)    SpO2 97%    BMI 32.28 kg/m  General: Well developed, NAD, BMI noted Neck: No  thyromegaly  HEENT:  Normocephalic . Face symmetric, atraumatic Lungs:  CTA B Normal respiratory effort, no intercostal retractions, no accessory muscle use. Heart: RRR,  no  murmur.  Abdomen:  Not distended, soft, non-tender. No rebound or rigidity.   Lower extremities: no pretibial edema bilaterally.  Right pretibial area, normal to inspection and palpation Skin: Exposed areas without rash. Not pale. Not jaundice DRE: Normal sphincter tone, no stools, prostate normal size Neurologic:  alert & oriented X3.  Speech normal, gait appropriate for age and unassisted Strength symmetric and appropriate for age.Marland Kitchen  DTRs and motor symmetric Psych: Cognition and judgment appear intact.  Cooperative with normal attention span and concentration.  Behavior appropriate. No anxious or depressed appearing.     Assessment    Assessment HTN Hyperlipidemia Hypothyroidism (hypothyroidism  > S/P  radioiodine) Ascending aortic aneurysm DX 2010, 4.5 cm. MRA 08-2017 stable; Dr. Roxan Hockey Vitiligo. Acanthosis Nigricans Varicose veins  COVID infection  04/2021.  PLAN: Here for CPX HTN: Ambulatory BPs 110/62, 117/80.  Excellent control, continue amlodipine, metoprolol. Hyperlipidemia:   Atorva dose was increased to 80 mg on 06-2021, chol was well-controlled  however he reports abdominal pain "like burning" after taking Lipitor.  Plan: No cholesterol medication for 2 weeks, then go back on atorvastatin 40 mg and add Zetia.  Reassess in 2 months. Hypothyroidism: Check TSH. Ascending aortic aneurysm, last visit with vascular November 2022. LUTS: As described above, check a UA, urine culture and PSA. Paresthesias: At the right pretibial area, unclear etiology, check G25 and folic acid. Trial w/ Capsaicin, if not better and symptoms are bothersome consider neuro eval. Dermatitis: Request triamcinolone ointment refill, has occasional pruritus in different areas.  RF sent RTC 2 months.    This visit occurred during the SARS-CoV-2 public health emergency.  Safety protocols were in place, including screening questions prior to the visit, additional usage of staff PPE, and extensive  cleaning of exam room while observing appropriate contact time as indicated for disinfecting solutions.

## 2022-02-11 NOTE — Patient Instructions (Addendum)
Check the  blood pressure regularly BP GOAL is between 110/65 and  135/85. If it is consistently higher or lower, let me know   Cholesterol: Stop atorvastatin 80 mg. In 2 weeks, start atorvastatin 80 mg but only half tablet in the morning. Also take ezetimibe 10 mg every day. These 2 medications work together to lower your cholesterol.   GO TO THE LAB : Get the blood work     Dustin Wall, [place] back for a checkup in 2 months

## 2022-02-12 LAB — URINALYSIS, ROUTINE W REFLEX MICROSCOPIC
Bacteria, UA: NONE SEEN /HPF
Bilirubin Urine: NEGATIVE
Glucose, UA: NEGATIVE
Hgb urine dipstick: NEGATIVE
Hyaline Cast: NONE SEEN /LPF
Ketones, ur: NEGATIVE
Leukocytes,Ua: NEGATIVE
Nitrite: NEGATIVE
Specific Gravity, Urine: 1.012 (ref 1.001–1.035)
pH: 6.5 (ref 5.0–8.0)

## 2022-02-12 LAB — LIPID PANEL
Cholesterol: 132 mg/dL (ref <200)
HDL: 43 mg/dL (ref >=40)
LDL Cholesterol (Calc): 70 mg/dL (calc)
Non-HDL Cholesterol (Calc): 89 mg/dL (calc) (ref <130)
Total CHOL/HDL Ratio: 3.1 (calc) (ref <5.0)
Triglycerides: 108 mg/dL (ref <150)

## 2022-02-12 LAB — B12 AND FOLATE PANEL
Folate: 17.4 ng/mL
Vitamin B-12: 573 pg/mL (ref 200–1100)

## 2022-02-12 LAB — COMPREHENSIVE METABOLIC PANEL
AG Ratio: 1.5 (calc) (ref 1.0–2.5)
ALT: 32 U/L (ref 9–46)
AST: 26 U/L (ref 10–35)
Albumin: 4.3 g/dL (ref 3.6–5.1)
Alkaline phosphatase (APISO): 62 U/L (ref 35–144)
BUN: 11 mg/dL (ref 7–25)
CO2: 25 mmol/L (ref 20–32)
Calcium: 9.6 mg/dL (ref 8.6–10.3)
Chloride: 103 mmol/L (ref 98–110)
Creat: 0.9 mg/dL (ref 0.70–1.28)
Globulin: 2.8 g/dL (calc) (ref 1.9–3.7)
Glucose, Bld: 84 mg/dL (ref 65–99)
Potassium: 4.2 mmol/L (ref 3.5–5.3)
Sodium: 138 mmol/L (ref 135–146)
Total Bilirubin: 0.7 mg/dL (ref 0.2–1.2)
Total Protein: 7.1 g/dL (ref 6.1–8.1)

## 2022-02-12 LAB — CBC WITH DIFFERENTIAL/PLATELET
Absolute Monocytes: 370 cells/uL (ref 200–950)
Basophils Absolute: 22 cells/uL (ref 0–200)
Basophils Relative: 0.5 %
Eosinophils Absolute: 92 cells/uL (ref 15–500)
Eosinophils Relative: 2.1 %
HCT: 43.6 % (ref 38.5–50.0)
Hemoglobin: 14.3 g/dL (ref 13.2–17.1)
Lymphs Abs: 2204 cells/uL (ref 850–3900)
MCH: 27.4 pg (ref 27.0–33.0)
MCHC: 32.8 g/dL (ref 32.0–36.0)
MCV: 83.5 fL (ref 80.0–100.0)
MPV: 10.8 fL (ref 7.5–12.5)
Monocytes Relative: 8.4 %
Neutro Abs: 1712 cells/uL (ref 1500–7800)
Neutrophils Relative %: 38.9 %
Platelets: 190 10*3/uL (ref 140–400)
RBC: 5.22 10*6/uL (ref 4.20–5.80)
RDW: 14.4 % (ref 11.0–15.0)
Total Lymphocyte: 50.1 %
WBC: 4.4 10*3/uL (ref 3.8–10.8)

## 2022-02-12 LAB — PSA: PSA: 1.64 ng/mL (ref <=4.00)

## 2022-02-12 LAB — URINE CULTURE
MICRO NUMBER:: 13024312
Result:: NO GROWTH
SPECIMEN QUALITY:: ADEQUATE

## 2022-02-12 LAB — MICROSCOPIC MESSAGE

## 2022-02-12 LAB — TSH: TSH: 0.82 mIU/L (ref 0.40–4.50)

## 2022-02-13 ENCOUNTER — Encounter: Payer: Self-pay | Admitting: Internal Medicine

## 2022-02-13 NOTE — Assessment & Plan Note (Signed)
Here for CPX HTN: Ambulatory BPs 110/62, 117/80.  Excellent control, continue amlodipine, metoprolol. Hyperlipidemia:   Atorva dose was increased to 80 mg on 06-2021, chol was well-controlled  however he reports abdominal pain "like burning" after taking Lipitor.  Plan: No cholesterol medication for 2 weeks, then go back on atorvastatin 40 mg and add Zetia.  Reassess in 2 months. Hypothyroidism: Check TSH. Ascending aortic aneurysm, last visit with vascular November 2022. LUTS: As described above, check a UA, urine culture and PSA. Paresthesias: At the right pretibial area, unclear etiology, check M21 and folic acid. Trial w/ Capsaicin, if not better and symptoms are bothersome consider neuro eval. Dermatitis: Request triamcinolone ointment refill, has occasional pruritus in different areas.  RF sent RTC 2 months.

## 2022-02-13 NOTE — Assessment & Plan Note (Signed)
Zostavax 2013  (pt denies hading it) COVID vaccine: UTD. PNM , shingrix, Flu shot: Declined again  -CCS: Normal colonoscopy December 2012 (had external hemorrhoids).  Refer to GI, Dr. Ardis Hughs -Prostate cancer screening:  +FH 2 brothers , DRE negative, he has some nocturia, checking a UA, UCX, PSA. -Lifestyle: Encouraged physical activity and healthy eating. -Labs: CMP, FLP, CBC, TSH, PSA, Z00, folic acid, UA, urine culture -Healthcare POA discussed.

## 2022-03-20 ENCOUNTER — Other Ambulatory Visit: Payer: Self-pay | Admitting: Internal Medicine

## 2022-04-11 ENCOUNTER — Encounter: Payer: Self-pay | Admitting: Internal Medicine

## 2022-04-11 ENCOUNTER — Ambulatory Visit (INDEPENDENT_AMBULATORY_CARE_PROVIDER_SITE_OTHER): Payer: Medicare HMO | Admitting: Internal Medicine

## 2022-04-11 VITALS — BP 122/76 | HR 97 | Temp 98.1°F | Resp 18 | Ht 66.0 in | Wt 202.5 lb

## 2022-04-11 DIAGNOSIS — E785 Hyperlipidemia, unspecified: Secondary | ICD-10-CM

## 2022-04-11 DIAGNOSIS — I10 Essential (primary) hypertension: Secondary | ICD-10-CM

## 2022-04-11 LAB — LIPID PANEL
Cholesterol: 133 mg/dL (ref 0–200)
HDL: 52.2 mg/dL (ref >39.00)
LDL Cholesterol: 68 mg/dL (ref 0–99)
NonHDL: 81.18
Total CHOL/HDL Ratio: 3
Triglycerides: 67 mg/dL (ref 0.0–149.0)
VLDL: 13.4 mg/dL (ref 0.0–40.0)

## 2022-04-11 MED ORDER — AMLODIPINE BESYLATE 10 MG PO TABS: 10.0000 mg | ORAL_TABLET | Freq: Every day | ORAL | 1 refills | Status: DC

## 2022-04-11 NOTE — Patient Instructions (Signed)
Check the  blood pressure regularly.  BP GOAL is between 110/65 and  135/85. If it is consistently higher or lower, let me know    GO TO THE LAB : Get the blood work     GO TO THE FRONT DESK, PLEASE SCHEDULE YOUR APPOINTMENTS Come back for a checkup in 6 months 

## 2022-04-11 NOTE — Progress Notes (Addendum)
? ?  Subjective:  ? ? Patient ID: Dustin Wall, male    DOB: 04/14/1951, 71 y.o.   MRN: [id number] ? ?DOS:  04/11/2022 ?Type of visit - description: f/u ?Routine follow-up. ?HTN: No recent ambulatory BPs, good compliance with medications, needs a refill. ?High cholesterol: On atorvastatin and Zetia, denies any GI side effects.  Due for labs. ? ?Review of Systems ?See above  ? ?Past Medical History:  ?Diagnosis Date  ? Ascending aortic aneurysm ([place])   ? dx 2010 ~ 4.4 cm  ? GERD (gastroesophageal reflux disease)   ? Hyperlipidemia   ? Hypertension   ? Hypothyroidism   ? s/p hyperthyroidism, s/p radioiodine  ? ? ?Past Surgical History:  ?Procedure Laterality Date  ? HEMORRHOID SURGERY    ? ? ?Current Outpatient Medications  ?Medication Instructions  ? amLODipine (NORVASC) 10 mg, Oral, Daily  ? atorvastatin (LIPITOR) 40 mg, Oral, Daily at bedtime  ? ezetimibe (ZETIA) 10 mg, Oral, Daily  ? levothyroxine (SYNTHROID) 125 mcg, Oral, Daily before breakfast  ? metoprolol succinate (TOPROL-XL) 100 MG 24 hr tablet TAKE 1 TABLET BY MOUTH EVERY DAY WITH OR IMMEDIATELY FOLLOWING A MEAL  ? triamcinolone (KENALOG) 0.025 % ointment 1 application., Topical, Daily PRN  ? ? ?   ?Objective:  ? Physical Exam ?BP 122/76 (BP Location: Left Arm, Patient Position: Sitting, Cuff Size: Normal)   Pulse 97   Temp 98.1 ?F (36.7 ?C) (Oral)   Resp 18   Ht '5\' 6"'$  (1.676 m)   Wt 202 lb 8 oz (91.9 kg)   SpO2 98%   BMI 32.68 kg/m?  ?General:   ?Well developed, NAD, BMI noted. ?HEENT:  ?Normocephalic . Face symmetric, atraumatic ?Lungs:  ?CTA B ?Normal respiratory effort, no intercostal retractions, no accessory muscle use. ?Heart: RRR,  no murmur.  ?Lower extremities: no pretibial edema bilaterally  ?Skin: Not pale. Not jaundice ?Neurologic:  ?alert & oriented X3.  ?Speech normal, gait appropriate for age and unassisted ?Psych--  ?Cognition and judgment appear intact.  ?Cooperative with normal attention span and concentration.  ?Behavior  appropriate. ?No anxious or depressed appearing.  ? ?   ?Assessment   ? ? Assessment ?HTN ?Hyperlipidemia ?Hypothyroidism (hypothyroidism  > S/P  radioiodine) ?Ascending aortic aneurysm DX 2010, 4.5 cm. MRA 08-2017 stable; Dr. Roxan Hockey ?Vitiligo. Acanthosis Nigricans ?Varicose veins  ?COVID infection  04/2021. ? ?PLAN: ?HTN: BP today is very good, continue metoprolol, amlodipine (refill sent). ?High cholesterol: See last visit, was having GI symptoms with atorvastatin 80 mg, consequently we drop atorvastatin dose to 40 mg qd  and added Zetia.  No side effects, check FLP. ?LUTS: See last visit, UA, urine culture and PSA were normal. ?RTC 6 months  ? ?This visit occurred during the SARS-CoV-2 public health emergency.  Safety protocols were in place, including screening questions prior to the visit, additional usage of staff PPE, and extensive cleaning of exam room while observing appropriate contact time as indicated for disinfecting solutions.  ? ?

## 2022-04-12 NOTE — Assessment & Plan Note (Addendum)
HTN: BP today is very good, continue metoprolol, amlodipine (refill sent). ?High cholesterol: See last visit, was having GI symptoms with atorvastatin 80 mg, consequently we drop atorvastatin dose to 40 mg qd  and added Zetia.  No side effects, check FLP. ?LUTS: See last visit, UA, urine culture and PSA were normal. ?RTC 6 months  ?

## 2022-05-09 ENCOUNTER — Encounter: Payer: Self-pay | Admitting: Internal Medicine

## 2022-05-09 ENCOUNTER — Ambulatory Visit (INDEPENDENT_AMBULATORY_CARE_PROVIDER_SITE_OTHER): Payer: Medicare HMO | Admitting: Internal Medicine

## 2022-05-09 VITALS — BP 126/86 | HR 71 | Temp 97.9°F | Resp 16 | Ht 66.0 in | Wt 199.4 lb

## 2022-05-09 DIAGNOSIS — R319 Hematuria, unspecified: Secondary | ICD-10-CM

## 2022-05-09 DIAGNOSIS — N410 Acute prostatitis: Secondary | ICD-10-CM | POA: Diagnosis not present

## 2022-05-09 DIAGNOSIS — N390 Urinary tract infection, site not specified: Secondary | ICD-10-CM

## 2022-05-09 LAB — POC URINALSYSI DIPSTICK (AUTOMATED)
Bilirubin, UA: NEGATIVE
Glucose, UA: NEGATIVE
Ketones, UA: NEGATIVE
Nitrite, UA: POSITIVE
Protein, UA: NEGATIVE
Spec Grav, UA: 1.02 (ref 1.010–1.025)
Urobilinogen, UA: 0.2 E.U./dL
pH, UA: 6 (ref 5.0–8.0)

## 2022-05-09 MED ORDER — SULFAMETHOXAZOLE-TRIMETHOPRIM 800-160 MG PO TABS: 1.0000 | ORAL_TABLET | Freq: Two times a day (BID) | ORAL | 0 refills | Status: DC

## 2022-05-09 MED ORDER — TAMSULOSIN HCL 0.4 MG PO CAPS: 0.4000 mg | ORAL_CAPSULE | Freq: Every day | ORAL | 0 refills | Status: DC

## 2022-05-09 NOTE — Patient Instructions (Addendum)
Go to the lab, we need a blood sample ? ?Drink plenty of fluids ? ?Start Flomax 0.4 mg 1 tablet every night to help you with a urination ? ?Start antibiotic called Bactrim twice daily ? ?Call if not gradually better. ? ?Definitely seek medical attention if severe symptoms, high fevers, unable to urinate, severe stomach pain. ? ? ? ?=== ? ? ?GI has been trying to contact you to schedule your colonoscopy, please call them at [phone]. ? ? ?

## 2022-05-09 NOTE — Progress Notes (Addendum)
? ?  Subjective:  ? ? Patient ID: Dustin Wall, male    DOB: 05-06-1951, 71 y.o.   MRN: [id number] ? ?DOS:  05/09/2022 ?Type of visit - description: Acute ? ?Here with his wife. ?Last night, developed severe low, bilateral back pain with some radiation anteriorly to both side of the abdomen. ?The pain was so severe she could not sleep. ?It was not particularly worse by moving, standing or lying down. ?he also developed dysuria and urinary frequency "every 10 minutes". ?The urine was dark but he denies gross hematuria. ? ?No fevers but did have chills. ?Nausea or vomiting. ?No rash anywhere. ?Some difficulty urinating. ?Denies constipation, had normal bowel movements ?He did do some heavy lifting 2 weeks ago. ?He does have shoulder pain on and off with some radiation to the upper extremities. ? ?Review of Systems ?See above  ? ?Past Medical History:  ?Diagnosis Date  ? Ascending aortic aneurysm ([place])   ? dx 2010 ~ 4.4 cm  ? GERD (gastroesophageal reflux disease)   ? Hyperlipidemia   ? Hypertension   ? Hypothyroidism   ? s/p hyperthyroidism, s/p radioiodine  ? ? ?Past Surgical History:  ?Procedure Laterality Date  ? HEMORRHOID SURGERY    ? ? ?Current Outpatient Medications  ?Medication Instructions  ? amLODipine (NORVASC) 10 mg, Oral, Daily  ? atorvastatin (LIPITOR) 40 mg, Oral, Daily at bedtime  ? ezetimibe (ZETIA) 10 mg, Oral, Daily  ? levothyroxine (SYNTHROID) 125 mcg, Oral, Daily before breakfast  ? metoprolol succinate (TOPROL-XL) 100 MG 24 hr tablet TAKE 1 TABLET BY MOUTH EVERY DAY WITH OR IMMEDIATELY FOLLOWING A MEAL  ? triamcinolone (KENALOG) 0.025 % ointment 1 application., Topical, Daily PRN  ? ? ?   ?Objective:  ? Physical Exam ?BP 126/86 (BP Location: Left Arm, Patient Position: Sitting, Cuff Size: Small)   Pulse 71   Temp 97.9 ?F (36.6 ?C) (Oral)   Resp 16   Ht '5\' 6"'$  (1.676 m)   Wt 199 lb 6 oz (90.4 kg)   SpO2 96%   BMI 32.18 kg/m?  ?General:   ?Well developed, NAD, BMI noted.  Not toxic  appearing ?HEENT:  ?Normocephalic . Face symmetric, atraumatic ?Lungs:  ?CTA B ?Normal respiratory effort, no intercostal retractions, no accessory muscle use. ?Heart: RRR,  no murmur.  ?Abdomen:  ?Not distended, soft, mild tenderness in the lower abdomen bilaterally with no rebound.  No mass, unable to palpate a distended  bladder. ?Questionable R CVA tenderness ?DRE: R prostate side seems slightly tender but no fluctuant.  No nodules. ?Skin: Not pale. Not jaundice ?Lower extremities: no pretibial edema bilaterally  ?Neurologic:  ?alert & oriented X3.  ?Speech normal, gait and posture does not seem antalgic. ?Psych--  ?Cognition and judgment appear intact.  ?Cooperative with normal attention span and concentration.  ?Behavior appropriate. ?No anxious or depressed appearing. ? ?   ?Assessment   ? ?Assessment ?HTN ?Hyperlipidemia ?Hypothyroidism (hypothyroidism  > S/P  radioiodine) ?Ascending aortic aneurysm DX 2010, 4.5 cm. MRA 08-2017 stable; Dr. Roxan Hockey ?Vitiligo. Acanthosis Nigricans ?Varicose veins  ?COVID infection  04/2021. ? ?PLAN: ?Back pain, abdominal pain, chills, LUTS: ?Symptoms as described above, Udip c/w UTI. ?Suspect UTI versus prostatitis, less likely kidney stone. ?Plan: ?CBC, BMP, PSA.  UA urine culture. ?Bactrim DS, #28 no refills.  Prolong antibiotic treatment if prostatitis is documented. ?Flomax ?Seek medical attention as well, see AVS ?

## 2022-05-10 LAB — CBC WITH DIFFERENTIAL/PLATELET
Basophils Absolute: 0.1 10*3/uL (ref 0.0–0.1)
Basophils Relative: 0.9 % (ref 0.0–3.0)
Eosinophils Absolute: 0.1 10*3/uL (ref 0.0–0.7)
Eosinophils Relative: 0.9 % (ref 0.0–5.0)
HCT: 42.8 % (ref 39.0–52.0)
Hemoglobin: 13.7 g/dL (ref 13.0–17.0)
Lymphocytes Relative: 26.7 % (ref 12.0–46.0)
Lymphs Abs: 2.4 10*3/uL (ref 0.7–4.0)
MCHC: 32 g/dL (ref 30.0–36.0)
MCV: 86.1 fl (ref 78.0–100.0)
Monocytes Absolute: 0.8 10*3/uL (ref 0.1–1.0)
Monocytes Relative: 9.3 % (ref 3.0–12.0)
Neutro Abs: 5.5 10*3/uL (ref 1.4–7.7)
Neutrophils Relative %: 62.2 % (ref 43.0–77.0)
Platelets: 179 10*3/uL (ref 150.0–400.0)
RBC: 4.97 Mil/uL (ref 4.22–5.81)
RDW: 15.6 % — ABNORMAL HIGH (ref 11.5–15.5)
WBC: 8.8 10*3/uL (ref 4.0–10.5)

## 2022-05-10 LAB — BASIC METABOLIC PANEL
BUN: 14 mg/dL (ref 6–23)
CO2: 27 mEq/L (ref 19–32)
Calcium: 9.2 mg/dL (ref 8.4–10.5)
Chloride: 100 mEq/L (ref 96–112)
Creatinine, Ser: 1.04 mg/dL (ref 0.40–1.50)
GFR: 72.1 mL/min (ref >60.00)
Glucose, Bld: 95 mg/dL (ref 70–99)
Potassium: 4 mEq/L (ref 3.5–5.1)
Sodium: 135 mEq/L (ref 135–145)

## 2022-05-10 LAB — PSA: PSA: 7.74 ng/mL — ABNORMAL HIGH (ref 0.10–4.00)

## 2022-05-10 NOTE — Assessment & Plan Note (Signed)
Back pain, abdominal pain, chills, LUTS: ?Symptoms as described above, Udip c/w UTI. ?Suspect UTI versus prostatitis, less likely kidney stone. ?Plan: ?CBC, BMP, PSA.  UA urine culture. ?Bactrim DS, #28 no refills.  Prolong antibiotic treatment if prostatitis is documented. ?Flomax ?Seek medical attention as well, see AVS ?

## 2022-05-11 ENCOUNTER — Telehealth: Payer: Self-pay | Admitting: Internal Medicine

## 2022-05-11 ENCOUNTER — Telehealth: Payer: Self-pay | Admitting: *Deleted

## 2022-05-11 NOTE — Telephone Encounter (Signed)
I only found 1 typo, corrected. ?

## 2022-05-11 NOTE — Telephone Encounter (Signed)
Pt was in the office Monday evening and had orders for POCT urine, urinalysis (at [facility]) and urine culture.  POCT was completed and urine culture was sent out.  We documented that we sent his urine to [facility] but [facility] says they did not receive it.  Is there any need to have pt return for the u/a [facility] says they did not receive? ?

## 2022-05-11 NOTE — Telephone Encounter (Signed)
Pt's wife called regarding pt's chart and would like to discuss with CMA. Please advise  ?

## 2022-05-11 NOTE — Telephone Encounter (Signed)
Noted, already on antibiotics, too late to recollect the urine. ?Will notify Estill Bamberg  ?

## 2022-05-11 NOTE — Telephone Encounter (Signed)
Pt's wife called- they can see his most recent OV note on Mychart- they are upset with providers use of pronouns not being proofread- in one area of the visit states she and further down it states he, informed it was probably just a dictation error via Editor, commissioning that PCP uses. Pt and his wife would like this fixed.  ?

## 2022-05-12 LAB — URINE CULTURE
MICRO NUMBER:: 13396263
SPECIMEN QUALITY:: ADEQUATE

## 2022-05-16 MED ORDER — SULFAMETHOXAZOLE-TRIMETHOPRIM 800-160 MG PO TABS
1.0000 | ORAL_TABLET | Freq: Two times a day (BID) | ORAL | 0 refills | Status: DC
Start: 2022-05-16 — End: 2022-08-26

## 2022-05-16 NOTE — Addendum Note (Signed)
Addended byDamita Dunnings D on: 05/16/2022 08:10 AM   Modules accepted: Orders

## 2022-05-31 ENCOUNTER — Other Ambulatory Visit: Payer: Self-pay | Admitting: Internal Medicine

## 2022-06-01 ENCOUNTER — Encounter: Payer: Self-pay | Admitting: Internal Medicine

## 2022-07-11 ENCOUNTER — Encounter: Payer: Self-pay | Admitting: Internal Medicine

## 2022-07-11 DIAGNOSIS — Z800 Family history of malignant neoplasm of digestive organs: Secondary | ICD-10-CM | POA: Diagnosis not present

## 2022-07-11 DIAGNOSIS — M1990 Unspecified osteoarthritis, unspecified site: Secondary | ICD-10-CM | POA: Diagnosis not present

## 2022-07-11 DIAGNOSIS — I2510 Atherosclerotic heart disease of native coronary artery without angina pectoris: Secondary | ICD-10-CM | POA: Diagnosis not present

## 2022-07-11 DIAGNOSIS — Z791 Long term (current) use of non-steroidal anti-inflammatories (NSAID): Secondary | ICD-10-CM | POA: Diagnosis not present

## 2022-07-11 DIAGNOSIS — Z6831 Body mass index (BMI) 31.0-31.9, adult: Secondary | ICD-10-CM | POA: Diagnosis not present

## 2022-07-11 DIAGNOSIS — I700 Atherosclerosis of aorta: Secondary | ICD-10-CM | POA: Diagnosis not present

## 2022-07-11 DIAGNOSIS — E039 Hypothyroidism, unspecified: Secondary | ICD-10-CM | POA: Diagnosis not present

## 2022-07-11 DIAGNOSIS — I10 Essential (primary) hypertension: Secondary | ICD-10-CM | POA: Diagnosis not present

## 2022-07-11 DIAGNOSIS — I719 Aortic aneurysm of unspecified site, without rupture: Secondary | ICD-10-CM | POA: Diagnosis not present

## 2022-07-11 DIAGNOSIS — L309 Dermatitis, unspecified: Secondary | ICD-10-CM | POA: Diagnosis not present

## 2022-07-11 DIAGNOSIS — E669 Obesity, unspecified: Secondary | ICD-10-CM | POA: Diagnosis not present

## 2022-07-11 DIAGNOSIS — Z008 Encounter for other general examination: Secondary | ICD-10-CM | POA: Diagnosis not present

## 2022-07-11 DIAGNOSIS — E785 Hyperlipidemia, unspecified: Secondary | ICD-10-CM | POA: Diagnosis not present

## 2022-07-25 ENCOUNTER — Other Ambulatory Visit: Payer: Self-pay | Admitting: Internal Medicine

## 2022-08-26 ENCOUNTER — Encounter: Payer: Self-pay | Admitting: Gastroenterology

## 2022-08-26 ENCOUNTER — Ambulatory Visit (AMBULATORY_SURGERY_CENTER): Payer: Medicare HMO

## 2022-08-26 VITALS — Ht 66.0 in | Wt 196.0 lb

## 2022-08-26 DIAGNOSIS — Z1211 Encounter for screening for malignant neoplasm of colon: Secondary | ICD-10-CM

## 2022-08-26 NOTE — Progress Notes (Signed)
No egg or soy allergy known to patient  No issues known to pt with past sedation with any surgeries or procedures Patient denies ever being told they had issues or difficulty with intubation  No FH of Malignant Hyperthermia Pt is not on diet pills Pt is not on  home 02  Pt is not on blood thinners  Pt denies issues with constipation  No A fib or A flutter Have any cardiac testing pending--denied Pt instructed to use [email or web address] or GoodRx for a price reduction on prep   

## 2022-09-09 ENCOUNTER — Ambulatory Visit (AMBULATORY_SURGERY_CENTER): Payer: Medicare HMO | Admitting: Gastroenterology

## 2022-09-09 ENCOUNTER — Encounter: Payer: Self-pay | Admitting: Gastroenterology

## 2022-09-09 ENCOUNTER — Encounter: Payer: Medicare HMO | Admitting: Gastroenterology

## 2022-09-09 VITALS — BP 106/66 | HR 57 | Temp 98.6°F | Resp 14 | Ht 66.0 in | Wt 196.0 lb

## 2022-09-09 DIAGNOSIS — K635 Polyp of colon: Secondary | ICD-10-CM

## 2022-09-09 DIAGNOSIS — Z1211 Encounter for screening for malignant neoplasm of colon: Secondary | ICD-10-CM | POA: Diagnosis not present

## 2022-09-09 DIAGNOSIS — E039 Hypothyroidism, unspecified: Secondary | ICD-10-CM | POA: Diagnosis not present

## 2022-09-09 DIAGNOSIS — D127 Benign neoplasm of rectosigmoid junction: Secondary | ICD-10-CM | POA: Diagnosis not present

## 2022-09-09 DIAGNOSIS — D128 Benign neoplasm of rectum: Secondary | ICD-10-CM

## 2022-09-09 DIAGNOSIS — K621 Rectal polyp: Secondary | ICD-10-CM

## 2022-09-09 DIAGNOSIS — I10 Essential (primary) hypertension: Secondary | ICD-10-CM | POA: Diagnosis not present

## 2022-09-09 MED ORDER — SODIUM CHLORIDE 0.9 % IV SOLN: 500.0000 mL | Freq: Once | INTRAVENOUS | Status: DC

## 2022-09-09 NOTE — Patient Instructions (Signed)
3 polyps removed- await pathology  Please read over handouts about polyps, hemorrhoids, diverticulosis, and high fiber diets Continue your normal medications- use FiberCon 1-2 tablets daily   YOU HAD AN ENDOSCOPIC PROCEDURE TODAY AT [facility]:   Refer to the procedure report that was given to you for any specific questions about what was found during the examination.  If the procedure report does not answer your questions, please call your gastroenterologist to clarify.  If you requested that your care partner not be given the details of your procedure findings, then the procedure report has been included in a sealed envelope for you to review at your convenience later.  YOU SHOULD EXPECT: Some feelings of bloating in the abdomen. Passage of more gas than usual.  Walking can help get rid of the air that was put into your GI tract during the procedure and reduce the bloating. If you had a lower endoscopy (such as a colonoscopy or flexible sigmoidoscopy) you may notice spotting of blood in your stool or on the toilet paper. If you underwent a bowel prep for your procedure, you may not have a normal bowel movement for a few days.  Please Note:  You might notice some irritation and congestion in your nose or some drainage.  This is from the oxygen used during your procedure.  There is no need for concern and it should clear up in a day or so.  SYMPTOMS TO REPORT IMMEDIATELY:  Following lower endoscopy (colonoscopy or flexible sigmoidoscopy):  Excessive amounts of blood in the stool  Significant tenderness or worsening of abdominal pains  Swelling of the abdomen that is new, acute  Fever of 100F or higher  For urgent or emergent issues, a gastroenterologist can be reached at any hour by calling [phone]. Do not use MyChart messaging for urgent concerns.    DIET:  We do recommend a small meal at first, but then you may proceed to your regular diet.  Drink plenty of fluids  but you should avoid alcoholic beverages for 24 hours.  ACTIVITY:  You should plan to take it easy for the rest of today and you should NOT DRIVE or use heavy machinery until tomorrow (because of the sedation medicines used during the test).    FOLLOW UP: Our staff will call the number listed on your records the next business day following your procedure.  We will call around 7:15- 8:00 am to check on you and address any questions or concerns that you may have regarding the information given to you following your procedure. If we do not reach you, we will leave a message.     If any biopsies were taken you will be contacted by phone or by letter within the next 1-3 weeks.  Please call us at [phone] if you have not heard about the biopsies in 3 weeks.    SIGNATURES/CONFIDENTIALITY: You and/or your care partner have signed paperwork which will be entered into your electronic medical record.  These signatures attest to the fact that that the information above on your After Visit Summary has been reviewed and is understood.  Full responsibility of the confidentiality of this discharge information lies with you and/or your care-partner.

## 2022-09-09 NOTE — Progress Notes (Signed)
Called to room to assist during endoscopic procedure.  Patient ID and intended procedure confirmed with present staff. Received instructions for my participation in the procedure from the performing physician.  

## 2022-09-09 NOTE — Progress Notes (Signed)
GASTROENTEROLOGY PROCEDURE H&P NOTE   Primary Care Physician: Colon Branch, MD  HPI: Dustin Wall is a 72 y.o. male who presents for colonoscopy for screening.  Past Medical History:  Diagnosis Date   Ascending aortic aneurysm ([place])    dx 2010 ~ 4.4 cm, ..4.7cm 11/11/21   GERD (gastroesophageal reflux disease)    Hyperlipidemia    Hypertension    Hypothyroidism    s/p hyperthyroidism, s/p radioiodine   Past Surgical History:  Procedure Laterality Date   COLONOSCOPY     ?2012 in [place]  12/16/2014   Current Outpatient Medications  Medication Sig Dispense Refill   amLODipine (NORVASC) 10 MG tablet Take 1 tablet (10 mg total) by mouth daily. 90 tablet 1   atorvastatin (LIPITOR) 80 MG tablet Take 0.5 tablets (40 mg total) by mouth at bedtime.     ezetimibe (ZETIA) 10 MG tablet Take 1 tablet (10 mg total) by mouth daily. 90 tablet 3   levothyroxine (SYNTHROID) 125 MCG tablet Take 1 tablet (125 mcg total) by mouth daily before breakfast. 90 tablet 1   metoprolol succinate (TOPROL-XL) 100 MG 24 hr tablet TAKE 1 TABLET BY MOUTH EVERY DAY WITH OR IMMEDIATELY FOLLOWING A MEAL 90 tablet 1   triamcinolone (KENALOG) 0.025 % ointment Apply 1 application topically daily as needed. 80 g 1   Current Facility-Administered Medications  Medication Dose Route Frequency Provider Last Rate Last Admin   0.9 %  sodium chloride infusion  500 mL Intravenous Once Mansouraty, Telford Nab., MD        Current Outpatient Medications:    amLODipine (NORVASC) 10 MG tablet, Take 1 tablet (10 mg total) by mouth daily., Disp: 90 tablet, Rfl: 1   atorvastatin (LIPITOR) 80 MG tablet, Take 0.5 tablets (40 mg total) by mouth at bedtime., Disp: , Rfl:    ezetimibe (ZETIA) 10 MG tablet, Take 1 tablet (10 mg total) by mouth daily., Disp: 90 tablet, Rfl: 3   levothyroxine (SYNTHROID) 125 MCG tablet, Take 1 tablet (125 mcg total) by mouth daily before breakfast.,  Disp: 90 tablet, Rfl: 1   metoprolol succinate (TOPROL-XL) 100 MG 24 hr tablet, TAKE 1 TABLET BY MOUTH EVERY DAY WITH OR IMMEDIATELY FOLLOWING A MEAL, Disp: 90 tablet, Rfl: 1   triamcinolone (KENALOG) 0.025 % ointment, Apply 1 application topically daily as needed., Disp: 80 g, Rfl: 1  Current Facility-Administered Medications:    0.9 %  sodium chloride infusion, 500 mL, Intravenous, Once, Mansouraty, Telford Nab., MD Allergies  Allergen Reactions   Gadolinium Derivatives Nausea And Vomiting    Pt was given IV Zofran per Dr Kathlene Cote prior to imaging for n/v   Pt states prev n/v from gad at GI from [facility]   Family History  Problem Relation Age of Onset   Stomach cancer Father    Cancer Father    Prostate cancer Brother    Prostate cancer Brother    Hypertension Other        several fam members    Colon cancer Neg Hx    CAD Neg Hx    Diabetes Neg Hx    Stroke Neg Hx    Rectal cancer Neg Hx    Colon polyps Neg Hx    Social History   Socioeconomic History   Marital status: Married    Spouse name: Not on file   Number of children: 0   Years of education: Not on file  Highest education level: Not on file  Occupational History   Occupation: fully retired at age 63, sanitation  Tobacco Use   Smoking status: Never   Smokeless tobacco: Never  Vaping Use   Vaping Use: Never used  Substance and Sexual Activity   Alcohol use: Yes    Comment: beer and wine occasional   Drug use: No   Sexual activity: Yes  Other Topics Concern   Not on file  Social History Narrative   Lives w/ wife   Social Determinants of Health   Financial Resource Strain: Low Risk  (11/08/2021)   Overall Financial Resource Strain (CARDIA)    Difficulty of Paying Living Expenses: Not hard at all  Food Insecurity: No Food Insecurity (11/08/2021)   Hunger Vital Sign    Worried About Running Out of Food in the Last Year: Never true    Ran Out of Food in the Last Year: Never true  Transportation Needs: No  Transportation Needs (11/08/2021)   PRAPARE - Hydrologist (Medical): No    Lack of Transportation (Non-Medical): No  Physical Activity: Sufficiently Active (11/08/2021)   Exercise Vital Sign    Days of Exercise per Week: 5 days    Minutes of Exercise per Session: 60 min  Stress: No Stress Concern Present (11/08/2021)   [place]    Feeling of Stress : Not at all  Social Connections: Socially Isolated (11/08/2021)   Social Connection and Isolation Panel [NHANES]    Frequency of Communication with Friends and Family: Once a week    Frequency of Social Gatherings with Friends and Family: Once a week    Attends Religious Services: Never    Marine scientist or Organizations: No    Attends Archivist Meetings: Never    Marital Status: Married  Human resources officer Violence: Not At Risk (11/08/2021)   Humiliation, Afraid, Rape, and Kick questionnaire    Fear of Current or Ex-Partner: No    Emotionally Abused: No    Physically Abused: No    Sexually Abused: No    Physical Exam: Today's Vitals   09/09/22 1354 09/09/22 1355  BP: 115/77   Pulse: 75   Temp: 98.6 F (37 C) 98.6 F (37 C)  TempSrc: Temporal   SpO2: 98%   Weight: 196 lb (88.9 kg)   Height: '5\' 6"'$  (1.676 m)    Body mass index is 31.64 kg/m. GEN: NAD EYE: Sclerae anicteric ENT: MMM CV: Non-tachycardic GI: Soft, NT/ND NEURO:  Alert & Oriented x 3  Lab Results: No results for input(s): "WBC", "HGB", "HCT", "PLT" in the last 72 hours. BMET No results for input(s): "NA", "K", "CL", "CO2", "GLUCOSE", "BUN", "CREATININE", "CALCIUM" in the last 72 hours. LFT No results for input(s): "PROT", "ALBUMIN", "AST", "ALT", "ALKPHOS", "BILITOT", "BILIDIR", "IBILI" in the last 72 hours. PT/INR No results for input(s): "LABPROT", "INR" in the last 72 hours.   Impression / Plan: This is a 71 y.o.male who presents for  colonoscopy for screening.  The risks and benefits of endoscopic evaluation/treatment were discussed with the patient and/or family; these include but are not limited to the risk of perforation, infection, bleeding, missed lesions, lack of diagnosis, severe illness requiring hospitalization, as well as anesthesia and sedation related illnesses.  The patient's history has been reviewed, patient examined, no change in status, and deemed stable for procedure.  The patient and/or family is agreeable to proceed.  Justice Britain, MD [facility] Advanced Endoscopy Office # [phone]

## 2022-09-09 NOTE — Op Note (Signed)
Dustin Wall Patient Name: Dustin Wall Procedure Date: 09/09/2022 2:55 PM MRN: [id number] Endoscopist: Justice Britain , MD Age: 71 Referring MD:  Date of Birth: [birth year] Gender: Male Account #: [id number] Procedure:                Colonoscopy Indications:              Screening for colorectal malignant neoplasm Medicines:                Monitored Anesthesia Care Procedure:                Pre-Anesthesia Assessment:                           - Prior to the procedure, a History and Physical                            was performed, and patient medications and                            allergies were reviewed. The patient's tolerance of                            previous anesthesia was also reviewed. The risks                            and benefits of the procedure and the sedation                            options and risks were discussed with the patient.                            All questions were answered, and informed consent                            was obtained. Prior Anticoagulants: The patient has                            taken no previous anticoagulant or antiplatelet                            agents. ASA Grade Assessment: II - A patient with                            mild systemic disease. After reviewing the risks                            and benefits, the patient was deemed in                            satisfactory condition to undergo the procedure.                           After obtaining informed consent, the colonoscope  was passed under direct vision. Throughout the                            procedure, the patient's blood pressure, pulse, and                            oxygen saturations were monitored continuously. The                            Olympus CF-HQ190L [id number]) Colonoscope was                            introduced through the anus and advanced to the the                            cecum, identified  by appendiceal orifice and                            ileocecal valve. The colonoscopy was somewhat                            difficult due to a redundant colon. Successful                            completion of the procedure was aided by changing                            the patient's position, using manual pressure and                            using scope torsion. The patient tolerated the                            procedure. The quality of the bowel preparation was                            adequate. The ileocecal valve, appendiceal orifice,                            and rectum were photographed. Scope In: 3:05:56 PM Scope Out: 3:22:49 PM Scope Withdrawal Time: 0 hours 12 minutes 13 seconds  Total Procedure Duration: 0 hours 16 minutes 53 seconds  Findings:                 The digital rectal exam findings include                            hemorrhoids. Pertinent negatives include no                            palpable rectal lesions.                           The colon (entire examined portion) was  significantly redundant leading to significant                            looping.                           Multiple small-mouthed diverticula were found in                            the recto-sigmoid colon, sigmoid colon, descending                            colon, hepatic flexure and ascending colon.                           Three sessile polyps were found in the rectum (1)                            and recto-sigmoid colon (2). The polyps were 2 to 4                            mm in size. These polyps were removed with a cold                            snare. Resection and retrieval were complete.                           Normal mucosa was found in the entire colon                            otherwise.                           Non-bleeding non-thrombosed internal hemorrhoids                            were found during retroflexion, during  perianal                            exam and during digital exam. The hemorrhoids were                            Grade II (internal hemorrhoids that prolapse but                            reduce spontaneously). Complications:            No immediate complications. Estimated Blood Loss:     Estimated blood loss was minimal. Impression:               - Hemorrhoids found on digital rectal exam.                           - Redundant colon leading to significant looping.                           -  Diverticulosis in the recto-sigmoid colon, in the                            sigmoid colon, in the descending colon, at the                            hepatic flexure and in the ascending colon.                           - Three, 2 to 4 mm polyps in the rectum and at the                            recto-sigmoid colon, removed with a cold snare.                            Resected and retrieved.                           - Normal mucosa in the entire examined colon                            otherwise.                           - Non-bleeding non-thrombosed internal hemorrhoids. Recommendation:           - The patient will be observed post-procedure,                            until all discharge criteria are met.                           - Discharge patient to home.                           - Patient has a contact number available for                            emergencies. The signs and symptoms of potential                            delayed complications were discussed with the                            patient. Return to normal activities tomorrow.                            Written discharge instructions were provided to the                            patient.                           - High fiber diet.                           -  Use FiberCon 1-2 tablets PO daily.                           - Continue present medications.                           - Await pathology results.                            - Repeat colonoscopy in 3/05/01/09 years for                            surveillance based on pathology results.                           - The findings and recommendations were discussed                            with the patient.                           - The findings and recommendations were discussed                            with the patient's family. Justice Britain, MD 09/09/2022 3:31:20 PM

## 2022-09-09 NOTE — Progress Notes (Signed)
Report to PACU, RN, vss, BBS= Clear.  

## 2022-09-09 NOTE — Progress Notes (Signed)
Pt's states no medical or surgical changes since previsit or office visit. 

## 2022-09-12 ENCOUNTER — Telehealth: Payer: Self-pay | Admitting: *Deleted

## 2022-09-12 NOTE — Telephone Encounter (Signed)
  Follow up Call-     09/09/2022    1:55 PM  Call back number  Post procedure Call Back phone  # [phone]  Permission to leave phone message Yes     Patient questions:  Do you have a fever, pain , or abdominal swelling? No. Pain Score  0 *  Have you tolerated food without any problems? Yes.    Have you been able to return to your normal activities? Yes.    Do you have any questions about your discharge instructions: Diet   No. Medications  No. Follow up visit  No.  Do you have questions or concerns about your Care? No.  Actions: * If pain score is 4 or above: No action needed, pain <4.

## 2022-09-16 ENCOUNTER — Encounter: Payer: Self-pay | Admitting: Gastroenterology

## 2022-09-22 ENCOUNTER — Other Ambulatory Visit: Payer: Self-pay | Admitting: Thoracic Surgery (Cardiothoracic Vascular Surgery)

## 2022-09-22 DIAGNOSIS — I7121 Aneurysm of the ascending aorta, without rupture: Secondary | ICD-10-CM

## 2022-09-29 DIAGNOSIS — Z0100 Encounter for examination of eyes and vision without abnormal findings: Secondary | ICD-10-CM | POA: Diagnosis not present

## 2022-10-03 ENCOUNTER — Other Ambulatory Visit: Payer: Medicare HMO

## 2022-10-03 ENCOUNTER — Ambulatory Visit (INDEPENDENT_AMBULATORY_CARE_PROVIDER_SITE_OTHER): Payer: Medicare HMO | Admitting: Internal Medicine

## 2022-10-03 ENCOUNTER — Encounter: Payer: Self-pay | Admitting: Internal Medicine

## 2022-10-03 VITALS — BP 126/80 | HR 60 | Temp 98.0°F | Resp 18 | Ht 66.0 in | Wt 201.4 lb

## 2022-10-03 DIAGNOSIS — I10 Essential (primary) hypertension: Secondary | ICD-10-CM

## 2022-10-03 DIAGNOSIS — L309 Dermatitis, unspecified: Secondary | ICD-10-CM

## 2022-10-03 DIAGNOSIS — N410 Acute prostatitis: Secondary | ICD-10-CM | POA: Diagnosis not present

## 2022-10-03 DIAGNOSIS — E039 Hypothyroidism, unspecified: Secondary | ICD-10-CM | POA: Diagnosis not present

## 2022-10-03 LAB — URINALYSIS, ROUTINE W REFLEX MICROSCOPIC
Bilirubin Urine: NEGATIVE
Hgb urine dipstick: NEGATIVE
Ketones, ur: NEGATIVE
Leukocytes,Ua: NEGATIVE
Nitrite: NEGATIVE
RBC / HPF: NONE SEEN (ref 0–?)
Specific Gravity, Urine: 1.015 (ref 1.000–1.030)
Total Protein, Urine: NEGATIVE
Urine Glucose: NEGATIVE
Urobilinogen, UA: 0.2 (ref 0.0–1.0)
pH: 7 (ref 5.0–8.0)

## 2022-10-03 LAB — TSH: TSH: 0.58 u[IU]/mL (ref 0.35–5.50)

## 2022-10-03 LAB — PSA: PSA: 2.43 ng/mL (ref 0.10–4.00)

## 2022-10-03 MED ORDER — BETAMETHASONE DIPROPIONATE AUG 0.05 % EX CREA: TOPICAL_CREAM | Freq: Two times a day (BID) | CUTANEOUS | 0 refills | Status: DC

## 2022-10-03 MED ORDER — CETIRIZINE HCL 10 MG PO TABS: 10.0000 mg | ORAL_TABLET | Freq: Every day | ORAL | Status: AC

## 2022-10-03 NOTE — Patient Instructions (Addendum)
For your allergies:  Continue cetirizine 10 mg daily  Continue Systane for eye irritation  I sent a prescription for "augmented betamethasone dipropionate".  This is a strong cream to be used twice a day at the neck only.  You can use over-the-counter cream called hydrocortisone 1%: You can use it in your around your eyes, small amounts, 2 times a day, do not let the cream get into the eye.  We are referring you to an allergist     [facility] : Get the blood work     [place], [place] Come back for physical exam by 01-2023

## 2022-10-03 NOTE — Assessment & Plan Note (Signed)
HTN: Seems controlled, continue amlodipine, metoprolol. Hypothyroidism: On Synthroid, check TSH Prostatitis: See last visit, had LUTS, PSA increased to 7.7, status post Bactrim.  Had some urinary frequency last week but otherwise asx.  Check PSA, UA urine culture. Dermatitis: Fourth episode of dermatitis, this time started 2 weeks ago, environmental?  Denies using any new products or new neck chain. Continue cetirizine, Diprolene for the neck, OTC hydrocortisone 1% for the eyes, allergy referral RTC CPX 01-2023

## 2022-10-03 NOTE — Progress Notes (Signed)
   Subjective:    Patient ID: Dustin Wall, male    DOB: [birth year], 71 y.o.   MRN: [id number]  DOS:  10/03/2022 Type of visit - description: Follow-up  Recently diagnosed with prostatitis, all symptoms resolved except last week had some urinary frequency but otherwise no problems.  Specifically no gross hematuria, no nocturia.  Has developed dermatitis around the eyes and around the neck. Symptoms started 2 weeks ago, this is the fourth time something similar happened this year per pt. Eyes are slightly watery, vision is okay. Denies cough or wheezing.  Review of Systems See above   Past Medical History:  Diagnosis Date   Ascending aortic aneurysm ([place])    dx 2010 ~ 4.4 cm, ..4.7cm 11/11/21   GERD (gastroesophageal reflux disease)    Hyperlipidemia    Hypertension    Hypothyroidism    s/p hyperthyroidism, s/p radioiodine    Past Surgical History:  Procedure Laterality Date   COLONOSCOPY     ?2012 in [place]  12/16/2014    Current Outpatient Medications  Medication Instructions   amLODipine (NORVASC) 10 mg, Oral, Daily   atorvastatin (LIPITOR) 40 mg, Oral, Daily at bedtime   ezetimibe (ZETIA) 10 mg, Oral, Daily   levothyroxine (SYNTHROID) 125 mcg, Oral, Daily before breakfast   metoprolol succinate (TOPROL-XL) 100 MG 24 hr tablet TAKE 1 TABLET BY MOUTH EVERY DAY WITH OR IMMEDIATELY FOLLOWING A MEAL   triamcinolone (KENALOG) 0.025 % ointment 1 application , Topical, Daily PRN       Objective:   Physical Exam BP 126/80   Pulse 60   Temp 98 F (36.7 C) (Oral)   Resp 18   Ht '5\' 6"'$  (1.676 m)   Wt 201 lb 6 oz (91.3 kg)   SpO2 98%   BMI 32.50 kg/m  General:   Well developed, NAD, BMI noted. HEENT:  Normocephalic . Face symmetric, atraumatic Lungs:  CTA B Normal respiratory effort, no intercostal retractions, no accessory muscle use. Heart: RRR,  no murmur.  Lower extremities: no pretibial edema bilaterally   Skin: Irritation around the neck and eyelids.  See picture Neurologic:  alert & oriented X3.  Speech normal, gait appropriate for age and unassisted Psych--  Cognition and judgment appear intact.  Cooperative with normal attention span and concentration.  Behavior appropriate. No anxious or depressed appearing.         Assessment    Assessment HTN Hyperlipidemia Hypothyroidism (hypothyroidism  > S/P  radioiodine) Ascending aortic aneurysm DX 2010, 4.5 cm. MRA 08-2017 stable; Dr. Roxan Hockey Vitiligo. Acanthosis Nigricans Varicose veins  COVID infection  04/2021.  PLAN: HTN: Seems controlled, continue amlodipine, metoprolol. Hypothyroidism: On Synthroid, check TSH Prostatitis: See last visit, had LUTS, PSA increased to 7.7, status post Bactrim.  Had some urinary frequency last week but otherwise asx.  Check PSA, UA urine culture. Dermatitis: Fourth episode of dermatitis, this time started 2 weeks ago, environmental?  Denies using any new products or new neck chain. Continue cetirizine, Diprolene for the neck, OTC hydrocortisone 1% for the eyes, allergy referral RTC CPX 01-2023

## 2022-10-04 LAB — URINE CULTURE
MICRO NUMBER:: 14025537
SPECIMEN QUALITY:: ADEQUATE

## 2022-10-11 ENCOUNTER — Ambulatory Visit: Payer: Medicare HMO | Admitting: Internal Medicine

## 2022-10-17 ENCOUNTER — Other Ambulatory Visit: Payer: Self-pay | Admitting: Internal Medicine

## 2022-10-17 DIAGNOSIS — I10 Essential (primary) hypertension: Secondary | ICD-10-CM

## 2022-10-18 ENCOUNTER — Ambulatory Visit: Payer: Medicare HMO | Admitting: Thoracic Surgery (Cardiothoracic Vascular Surgery)

## 2022-10-18 ENCOUNTER — Ambulatory Visit: Payer: Medicare HMO | Admitting: Dermatology

## 2022-10-19 ENCOUNTER — Other Ambulatory Visit: Payer: Self-pay | Admitting: Internal Medicine

## 2022-10-19 DIAGNOSIS — H04123 Dry eye syndrome of bilateral lacrimal glands: Secondary | ICD-10-CM | POA: Diagnosis not present

## 2022-10-28 ENCOUNTER — Telehealth: Payer: Self-pay | Admitting: Internal Medicine

## 2022-10-28 DIAGNOSIS — M25519 Pain in unspecified shoulder: Secondary | ICD-10-CM

## 2022-10-28 NOTE — Telephone Encounter (Signed)
Patient came in the office stating his shoulder has been bothering him and last time that happened he went to sport medicine across the hall to get it checked out. He states they are needing a new referral placed in order to see him again. Please advise.

## 2022-10-28 NOTE — Telephone Encounter (Signed)
Referral placed.

## 2022-11-09 ENCOUNTER — Encounter: Payer: Self-pay | Admitting: Internal Medicine

## 2022-11-09 ENCOUNTER — Ambulatory Visit: Payer: Medicare HMO | Admitting: Internal Medicine

## 2022-11-09 VITALS — BP 126/80 | HR 66 | Temp 97.7°F | Resp 17 | Ht 65.0 in | Wt 197.1 lb

## 2022-11-09 DIAGNOSIS — J302 Other seasonal allergic rhinitis: Secondary | ICD-10-CM

## 2022-11-09 DIAGNOSIS — H1013 Acute atopic conjunctivitis, bilateral: Secondary | ICD-10-CM | POA: Diagnosis not present

## 2022-11-09 DIAGNOSIS — R21 Rash and other nonspecific skin eruption: Secondary | ICD-10-CM | POA: Diagnosis not present

## 2022-11-09 DIAGNOSIS — L235 Allergic contact dermatitis due to other chemical products: Secondary | ICD-10-CM

## 2022-11-09 DIAGNOSIS — J3089 Other allergic rhinitis: Secondary | ICD-10-CM

## 2022-11-09 MED ORDER — BETAMETHASONE DIPROPIONATE AUG 0.05 % EX CREA: TOPICAL_CREAM | Freq: Two times a day (BID) | CUTANEOUS | 0 refills | Status: DC | PRN

## 2022-11-09 NOTE — Addendum Note (Signed)
Addended byHarlon Flor on: 11/09/2022 03:29 PM   Modules accepted: Orders

## 2022-11-09 NOTE — Patient Instructions (Addendum)
Rash - Refer to dermatology. I will put this in the system.  Will work on scheduling for February - Can use betamethasone cream twice daily as needed for rash below neck.  For any break outs on face, use hydrocortisone over the counter.  Do not use for more than 7 days. - Consider patch testing as discussed in clinic. This would have to be done over 1 week on Monday, Wednesday, Friday.  Rhinitis: - Positive skin test to: trees, grasses, weeds, mold, DM - Avoidance measures discussed. - Use nasal saline rinses before nose sprays such as with Neilmed Sinus Rinse.  Use distilled water.   - Use Flonase 2 sprays each nostril daily for runny nose. Aim upward and outward. - Use Zyrtec 10 mg daily as needed for runny nose or itchy watery eyes.    Return if symptoms worsen or fail to improve.

## 2022-11-09 NOTE — Progress Notes (Signed)
NEW PATIENT  Date of Service/Encounter:  11/09/22  Consult requested by: Colon Branch, MD   Subjective:   Dustin Wall (DOB: 1951-11-12) is a 71 y.o. male who presents to the clinic on 11/09/2022 with a chief complaint of Allergy Testing (Pt states since moving here from [facility], he's has been experiencing dry, itchy, swelling eyes. And rash on his neck. Also the skin around his eyes feels very rough.), Dry Eye, Facial Swelling, and Rash .    History obtained from: chart review and patient.   Rash:  No history of eczema.   Initially started 9 years ago when he moved from [place] to [place] and was occurring about 1-2x/year but has now increased in frequency for the past year and has occurred about 4x/this year.  Reports it only happens in summer.  Last episode was 09/2022.  He breaks out in a itchy hyperpigmented rash around his neck and eyes.  It is usually in the same area.  Denies using any new products except some SPF but he says he rarely uses it.  No allergy to metals, he wears a gold chain.  Use bethamethasone for the most recent flare up on neck and hydrocortisone OTC for flare up around eyes and it resolved it.  He wishes to undergo allergy testing as this is happening seasonally during Summer.   Rhinitis:  Started when he in [place] but worse with coming to [facility].   Symptoms include: nasal congestion, rhinorrhea, post nasal drainage, sneezing, watery eyes, and itchy eyes  Occurs seasonally-wintertime Potential triggers: pollen  Treatments tried:  Benadryl PRN.  Last use was 2 weeks ago. Has tried other OTC anti histamines without much relief. Once tried nose spray but can't recall the name. Previous allergy testing: no History of chronic sinusitis or sinus surgery: nose surgery about 20 years ago  Past Medical History: Past Medical History:  Diagnosis Date   Ascending aortic aneurysm ([place])    dx 2010 ~ 4.4 cm, ..4.7cm 11/11/21   GERD (gastroesophageal reflux disease)    Hyperlipidemia     Hypertension    Hypothyroidism    s/p hyperthyroidism, s/p radioiodine    Past Surgical History: Past Surgical History:  Procedure Laterality Date   COLONOSCOPY     ?2012 in [place]  12/16/2014    Family History: Family History  Problem Relation Age of Onset   Stomach cancer Father    Cancer Father    Prostate cancer Brother    Prostate cancer Brother    Hypertension Other        several fam members    Colon cancer Neg Hx    CAD Neg Hx    Diabetes Neg Hx    Stroke Neg Hx    Rectal cancer Neg Hx    Colon polyps Neg Hx     Social History:  Lives in a 72 year old townhouse Flooring in bedroom: wood Pets: none Tobacco use/exposure: none Job: retired  Medication List:  Allergies as of 11/09/2022       Reactions   Gadolinium Derivatives Nausea And Vomiting   Pt was given IV Zofran per Dr Kathlene Cote prior to imaging for n/v   Pt states prev n/v from gad at GI from [facility]        Medication List        Accurate as of November 09, 2022  3:20 PM. If you have any questions, ask your  nurse or doctor.          amLODipine 10 MG tablet Commonly known as: NORVASC Take 1 tablet (10 mg total) by mouth daily.   atorvastatin 80 MG tablet Commonly known as: LIPITOR Take 0.5 tablets (40 mg total) by mouth at bedtime.   augmented betamethasone dipropionate 0.05 % cream Commonly known as: DIPROLENE-AF Apply topically 2 (two) times daily.   cetirizine 10 MG tablet Commonly known as: ZYRTEC Take 1 tablet (10 mg total) by mouth daily.   ezetimibe 10 MG tablet Commonly known as: Zetia Take 1 tablet (10 mg total) by mouth daily.   levothyroxine 125 MCG tablet Commonly known as: SYNTHROID Take 1 tablet (125 mcg total) by mouth daily before breakfast.   metoprolol succinate 100 MG 24 hr tablet Commonly known as: TOPROL-XL Take 1 tablet (100 mg total) by mouth daily. Take with or immediately following a meal          REVIEW OF SYSTEMS: Pertinent positives and negatives discussed in HPI.   Objective:   Physical Exam: BP 126/80   Pulse 66   Temp 97.7 F (36.5 C) (Temporal)   Resp 17   Ht '5\' 5"'$  (1.651 m)   Wt 197 lb 1.6 oz (89.4 kg)   SpO2 97%   BMI 32.80 kg/m  Body mass index is 32.8 kg/m. GEN: alert, well developed HEENT: clear conjunctiva, TM grey and translucent, nose with + inferior turbinate hypertrophy, pink nasal mucosa, no rhinorrhea, + cobblestoning HEART: regular rate and rhythm, no murmur LUNGS: clear to auscultation bilaterally, no coughing, unlabored respiration ABDOMEN: soft, non distended  SKIN: no rashes or lesions  Reviewed:  PCP visit 10/03/2022: seen for dermatitis, started on Zyrtec, Diprolene for neck, OTC hydrocortisone for eyes.  Referred to Allergy.  Skin Testing:  Skin prick testing was placed, which includes aeroallergens/foods, histamine control, and saline control.  Verbal consent was obtained prior to placing test.  Patient tolerated procedure well.  Allergy testing results were read and interpreted by myself, documented by clinical staff. Adequate positive and negative control.  Results discussed with patient/family.  Airborne Adult Perc - 11/09/22 1350     Time Antigen Placed 1350    Allergen Manufacturer Lavella Hammock    Location Back    Number of Test 58    Panel 1 Select    1. Control-Buffer 50% Glycerol Negative    2. Control-Histamine 1 mg/ml 2+    3. Albumin saline Negative    4. Arnot 3+    5. [place] Negative    6. Johnson 3+    7. Kentucky Blue 3+    9. Perennial Rye 3+    10. Sweet Vernal 3+    11. Timothy 3+    12. Cocklebur 3+    13. Burweed Marshelder 3+    14. Ragweed, short 3+    15. Ragweed, Giant 3+    16. Plantain,  English 3+    19. Rough Pigweed 3+    20. Marsh Elder, Rough 3+    21. Mugwort, Common Negative    22. Ash mix 3+    23. Birch mix Negative    24. Beech American Negative    25. Box, Elder Negative    26. Cedar,  red 3+    27. Cottonwood, [place] Negative    28. Elm mix Negative    29. Hickory Negative    30. Maple mix Negative    31. Oak, [place] mix Negative    32. Pecan Pollen  2+    33. Pine mix Negative    34. Sycamore Eastern Negative    35. South End, Black Pollen Negative    36. Alternaria alternata 3+    37. Cladosporium Herbarum Negative    38. Aspergillus mix Negative    39. Penicillium mix Negative    40. Bipolaris sorokiniana (Helminthosporium) 3+    41. Drechslera spicifera (Curvularia) 3+    42. Mucor plumbeus 3+    43. Fusarium moniliforme 3+    44. Aureobasidium pullulans (pullulara) Negative    45. Rhizopus oryzae Negative    46. Botrytis cinera 3+    47. Epicoccum nigrum 3+    48. Phoma betae Negative    49. Candida Albicans Negative    50. Trichophyton mentagrophytes 3+    51. Mite, D Farinae  5,000 AU/ml 3+    52. Mite, D Pteronyssinus  5,000 AU/ml 3+    53. Cat Hair 10,000 BAU/ml Negative    54.  Dog Epithelia Negative    55. Mixed Feathers Negative    56. Horse Epithelia Negative    57. Cockroach, German Negative    58. Mouse Negative    59. Tobacco Leaf Negative               Assessment:   1. Seasonal and perennial allergic rhinitis   2. Allergic conjunctivitis of both eyes   3. Allergic dermatitis due to other chemical product   4. Rash and nonspecific skin eruption     Plan/Recommendations:  Possibly Contact Dermatitis - Rash around eyes/neck could be contact dermatitis from chemical.  Discussed the utility of patch testing in this but he wishes to hold off on this.  Do not believe this is aeroallergen driven. - Refer to dermatology. I will put this in the system.  Will work on scheduling for February 2024.  - Can use betamethasone cream twice daily as needed for rash below neck.  For any break outs on face, use hydrocortisone over the counter.  Do not use for more than 7 days.  Allergic Rhinitis Allergic Conjunctivitis - Positive skin test to:  trees, grasses, weeds, mold, DM - Avoidance measures discussed. - Use nasal saline rinses before nose sprays such as with Neilmed Sinus Rinse.  Use distilled water.   - Use Flonase 2 sprays each nostril daily for runny nose. Aim upward and outward. - Use Zyrtec 10 mg daily as needed for runny nose or itchy watery eyes.     Return if symptoms worsen or fail to improve.  Harlon Flor, MD [facility]

## 2022-11-15 ENCOUNTER — Ambulatory Visit
Admission: RE | Admit: 2022-11-15 | Discharge: 2022-11-15 | Disposition: A | Discharge status: Home / Self Care | Payer: Medicare HMO | Source: Ambulatory Visit | Attending: Thoracic Surgery (Cardiothoracic Vascular Surgery) | Admitting: Thoracic Surgery (Cardiothoracic Vascular Surgery)

## 2022-11-15 DIAGNOSIS — I7121 Aneurysm of the ascending aorta, without rupture: Secondary | ICD-10-CM | POA: Diagnosis not present

## 2022-11-15 DIAGNOSIS — I700 Atherosclerosis of aorta: Secondary | ICD-10-CM | POA: Diagnosis not present

## 2022-11-22 ENCOUNTER — Encounter: Payer: Self-pay | Admitting: Thoracic Surgery (Cardiothoracic Vascular Surgery)

## 2022-11-22 ENCOUNTER — Ambulatory Visit: Payer: Medicare HMO | Admitting: Thoracic Surgery (Cardiothoracic Vascular Surgery)

## 2022-11-22 VITALS — BP 139/85 | HR 60 | Resp 18 | Ht 65.0 in | Wt 197.0 lb

## 2022-11-22 DIAGNOSIS — I7121 Aneurysm of the ascending aorta, without rupture: Secondary | ICD-10-CM | POA: Diagnosis not present

## 2022-11-22 NOTE — Progress Notes (Signed)
[address]       [place] [address]             [phone]     HPI: [name] returns for follow-up of an ascending aneurysm.  Dustin Wall is a 71 year old man with a history of hypertension, hyperlipidemia, aortic atherosclerosis, coronary atherosclerosis, ascending aneurysm, hypothyroidism after radioactive iodine, and arthritis.  He was first noted to have an ascending aneurysm in 2010.  It was 4.4 cm at that time.  There was some slight interval growth by 2020 an increase in size to 4.6 cm.  I recommended 80-monthfollow-up based on guidelines but he elected to continue with annual follow-up.  He was seen a year ago by Dustin Wall  His aneurysm was stable.  Over the past year he has been doing well.  He has not had any significant health issues.  Denies chest pain, pressure, tightness, or shortness of breath.  He checks his blood pressure at home regularly and has been well controlled usually less than 1048systolic.  Past Medical History:  Diagnosis Date   Ascending aortic aneurysm ([place]    dx 2010 ~ 4.4 cm, ..4.7cm 11/11/21   GERD (gastroesophageal reflux disease)    Hyperlipidemia    Hypertension    Hypothyroidism    s/p hyperthyroidism, s/p radioiodine    Current Outpatient Medications  Medication Sig Dispense Refill   amLODipine (NORVASC) 10 MG tablet Take 1 tablet (10 mg total) by mouth daily. 90 tablet 1   atorvastatin (LIPITOR) 80 MG tablet Take 0.5 tablets (40 mg total) by mouth at bedtime.     augmented betamethasone dipropionate (DIPROLENE-AF) 0.05 % cream Apply topically 2 (two) times daily as needed (for rash). 60 g 0   cetirizine (ZYRTEC) 10 MG tablet Take 1 tablet (10 mg total) by mouth daily.     ezetimibe (ZETIA) 10 MG tablet Take 1 tablet (10 mg total) by mouth daily. 90 tablet 3   levothyroxine (SYNTHROID) 125 MCG tablet Take 1 tablet (125 mcg total) by mouth daily before breakfast. 90 tablet 1   metoprolol succinate  (TOPROL-XL) 100 MG 24 hr tablet Take 1 tablet (100 mg total) by mouth daily. Take with or immediately following a meal 90 tablet 1   No current facility-administered medications for this visit.    Physical Exam BP 139/85 (BP Location: Left Arm, Patient Position: Sitting)   Pulse 60   Resp 18   Ht '5\' 5"'$  (1.651 m)   Wt 197 lb (89.4 kg)   SpO2 96% Comment: RA  BMI 32.78 kg/m  Well-appearing 71year old man in no acute distress Alert and oriented x3 with no focal deficits Lungs clear with equal breath sounds bilaterally Cardiac regular rate and rhythm with normal S1 and S2 No carotid bruits No peripheral edema  Diagnostic Tests: CT CHEST WITHOUT CONTRAST   TECHNIQUE: Multidetector CT imaging of the chest was performed following the standard protocol without IV contrast.   RADIATION DOSE REDUCTION: This exam was performed according to the departmental dose-optimization program which includes automated exposure control, adjustment of the mA and/or kV according to patient size and/or use of iterative reconstruction technique.   COMPARISON:  Chest CT dated November 11, 2021   FINDINGS: Cardiovascular: Dilated ascending thoracic aorta, measuring 4.5 x 4.5 cm at the level of the main pulmonary artery, unchanged when compared with prior exam and remeasured in similar plane. Mild calcified plaque of the thoracic aorta. Normal heart size. No pericardial  effusion. Mild coronary artery calcifications.   Mediastinum/Nodes: Esophagus is unremarkable. No pathologically enlarged lymph nodes seen in the chest   Lungs/Pleura: Central airways are patent. No consolidation, pleural effusion or pneumothorax   Upper Abdomen: No acute abnormality.   Musculoskeletal: No chest Wall mass or suspicious bone lesions identified.   IMPRESSION: 1. Dilated ascending thoracic aorta, measuring up to 4.5 cm, unchanged when compared with prior exam and remeasured in similar plane. Ascending thoracic  aortic aneurysm. Recommend semi-annual imaging followup by CTA or MRA and referral to cardiothoracic surgery if not already obtained. This recommendation follows 2010 ACCF/AHA/AATS/ACR/ASA/SCA/SCAI/SIR/STS/SVM Guidelines for the Diagnosis and Management of Patients With Thoracic Aortic Disease. Circulation. 2010; 121: V779-T903. Aortic aneurysm NOS (ICD10-I71.9) 2.  Aortic Atherosclerosis (ICD10-I70.0).     Electronically Signed   By: Yetta Glassman M.D.   On: 11/15/2022 10:41 I personally reviewed the CT images.  Noncontrast scan so precise measurement difficult but I get 4.6 cm maximum diameter.  Impression: Dustin Wall is a 71 year old man with a history of hypertension, hyperlipidemia, aortic atherosclerosis, coronary atherosclerosis, ascending aneurysm, hypothyroidism after radioactive iodine, and arthritis.   Ascending thoracic aortic aneurysm/thoracic aortic atherosclerosis-first noted in 2010.  Was 4.4 cm at that time.  Currently measures about 4.6 cm.  I again asked him to do a 47-monthfollow-up per guideline recommendations.  He wishes to continue with once a year scanning.  He has had an adverse reaction to gadolinium which caused nausea.  We have been doing noncontrast scans even though I explained to him that the CT contrast is different than the gadolinium.  However given the stability of the aneurysm over time I do not think there is any need for contrast unless there is a significant size change.  Hypertension-compliant with medications.  Blood pressure mildly elevated today at 1009systolic.  He does check himself regularly at home.  He understands her goal is less than 1233systolic.  Plan: Return in 1 year with CT chest no contrast  I spent over 20 minutes in review of records, images, and in consultation with Dustin Wall. Dustin Nakayama MD [facility] [phone]

## 2022-11-24 ENCOUNTER — Telehealth (INDEPENDENT_AMBULATORY_CARE_PROVIDER_SITE_OTHER): Payer: Medicare HMO | Admitting: Family Medicine

## 2022-11-24 ENCOUNTER — Encounter: Payer: Self-pay | Admitting: Family Medicine

## 2022-11-24 ENCOUNTER — Telehealth: Payer: Self-pay

## 2022-11-24 VITALS — BP 135/85 | Ht 66.0 in | Wt 189.0 lb

## 2022-11-24 DIAGNOSIS — Z0000 Encounter for general adult medical examination without abnormal findings: Secondary | ICD-10-CM

## 2022-11-24 NOTE — Progress Notes (Signed)
PATIENT CHECK-IN and HEALTH RISK ASSESSMENT QUESTIONNAIRE:  -completed by phone for upcoming Medicare Preventive Visit  Pre-Visit Check-in: 1)Vitals (height, wt, BP, etc) - record in vitals section for visit on day of visit 2)Review and Update Medications, Allergies PMH, Surgeries, Social history in Epic 3)Hospitalizations in the last year with date/reason? no 4)Review and Update Care Team (patient's specialists) in Epic 5) Complete PHQ9 in Epic  6) Complete Fall Screening in Epic 7)Review all Health Maintenance Due and order under PCP if not done.  Medicare Wellness Patient Questionnaire:  Answer theses question about your habits: Do you drink alcohol? no If yes, how many drinks do you have a day?on the weekend wine and beer - 2 glasses of wine or 2 bottles of wine  Have you ever smoked?no Quit date if applicable? na  How many packs a day do/did you smoke? na Do you use smokeless tobacco?na Do you use an illicit drugs?na Do you exercises? Yes IF so, what type and how many days/minutes per week?walk 1 hour 3 times a week, weights 3x per Are you sexually active? Yes Number of partners?1 Typical breakfast - skip or egg sandwich and coffee, eats apples, oranges, strawberries Typical lunch - burger and fries (once per week) Typical dinner salad and a sandwich, veggies and greens Typical snacks: cookies and chips, fruits  Beverages: water  Answer theses question about you: Can you perform most household chores? yes Do you find it hard to follow a conversation in a noisy room? no Do you often ask people to speak up or repeat themselves?no Do you feel that you have a problem with memory? no Do you balance your checkbook and or bank acounts?yes Do you feel safe at home? yes Last dentist visit? 8/23 Do you need assistance with any of the following: Please note if so no  Driving?  Feeding yourself?  Getting from bed to chair?  Getting to the toilet?  Bathing or showering?  Dressing  yourself?  Managing money?  Climbing a flight of stairs  Preparing meals?    Do you have Advanced Directives in place (Living Will, Healthcare Power or Attorney)? Yes    Last eye Exam and location? 10/23 [facility]   Do you currently use prescribed or non-prescribed narcotic or opioid pain medications? no  Do you have a history or close family history of breast, ovarian, tubal or peritoneal cancer or a family member with BRCA (breast cancer susceptibility 1 and 2) gene mutations? Father had stomach caner  Nurse/Assistant Credentials/time stamp:  Westley Hummer CMA ----------------------------------------------------------------------------------------------------------------------------------------------------------------------------------------------------------------------    MEDICARE ANNUAL PREVENTIVE CARE VISIT WITH PROVIDER (Welcome to Medicare, initial annual wellness or annual wellness exam)  Virtual Visit via Phone Note  I connected with Kw[place]ane  on 11/24/2022 by a video enabled telemedicine application and verified that I am speaking with the correct person using two identifiers.  Location patient: home Location provider:work or home office Persons participating in the virtual visit: patient, provider  Concerns and/or follow up today: none   See HM section in Epic for other details of completed HM.    ROS: negative for report of fevers, unintentional weight loss, vision changes, vision loss, hearing loss or change, chest pain, sob, hemoptysis, melena, hematochezia, hematuria, genital discharge or lesions, falls, bleeding or bruising, loc, thoughts of suicide or self harm, memory loss  Patient-completed extensive health risk assessment - reviewed and discussed with the patient: See Health Risk Assessment completed with patient prior to the visit either above or in recent  phone note. This was reviewed in detailed with the patient today and appropriate recommendations,  orders and referrals were placed as needed per Summary below and patient instructions.   Review of Medical History: -PMH, PSH, Family History and current specialty and care providers reviewed and updated and listed below   Patient Care Team: Colon Branch, MD as PCP - General (Internal Medicine) Zehr, Laban Emperor, PA-C as Physician Assistant (Gastroenterology) Melrose Nakayama, MD as Consulting Physician (Cardiothoracic Surgery) Lonia Skinner, MD as Consulting Physician (Ophthalmology)   Past Medical History:  Diagnosis Date   Ascending aortic aneurysm ([place])    dx 2010 ~ 4.4 cm, ..4.7cm 11/11/21   GERD (gastroesophageal reflux disease)    Hyperlipidemia    Hypertension    Hypothyroidism    s/p hyperthyroidism, s/p radioiodine    Past Surgical History:  Procedure Laterality Date   COLONOSCOPY     ?2012 in [place]  12/16/2014    Social History   Socioeconomic History   Marital status: Married    Spouse name: Not on file   Number of children: 0   Years of education: Not on file   Highest education level: Not on file  Occupational History   Occupation: fully retired at age 50, sanitation  Tobacco Use   Smoking status: Never   Smokeless tobacco: Never  Vaping Use   Vaping Use: Never used  Substance and Sexual Activity   Alcohol use: Yes    Comment: beer and wine occasional   Drug use: No   Sexual activity: Yes  Other Topics Concern   Not on file  Social History Narrative   Lives w/ wife   Social Determinants of Health   Financial Resource Strain: Low Risk  (11/08/2021)   Overall Financial Resource Strain (CARDIA)    Difficulty of Paying Living Expenses: Not hard at all  Food Insecurity: No Food Insecurity (11/08/2021)   Hunger Vital Sign    Worried About Running Out of Food in the Last Year: Never true    [place] in the Last Year: Never true  Transportation Needs: No Transportation Needs  (11/08/2021)   PRAPARE - Hydrologist (Medical): No    Lack of Transportation (Non-Medical): No  Physical Activity: Sufficiently Active (11/08/2021)   Exercise Vital Sign    Days of Exercise per Week: 5 days    Minutes of Exercise per Session: 60 min  Stress: No Stress Concern Present (11/08/2021)   [place]    Feeling of Stress : Not at all  Social Connections: Socially Isolated (11/08/2021)   Social Connection and Isolation Panel [NHANES]    Frequency of Communication with Friends and Family: Once a week    Frequency of Social Gatherings with Friends and Family: Once a week    Attends Religious Services: Never    Marine scientist or Organizations: No    Attends Archivist Meetings: Never    Marital Status: Married  Human resources officer Violence: Not At Risk (11/08/2021)   Humiliation, Afraid, Rape, and Kick questionnaire    Fear of Current or Ex-Partner: No    Emotionally Abused: No    Physically Abused: No    Sexually Abused: No    Family History  Problem Relation Age of Onset   Stomach cancer Father    Cancer Father    Prostate cancer  Brother    Prostate cancer Brother    Hypertension Other        several fam members    Colon cancer Neg Hx    CAD Neg Hx    Diabetes Neg Hx    Stroke Neg Hx    Rectal cancer Neg Hx    Colon polyps Neg Hx     Current Outpatient Medications on File Prior to Visit  Medication Sig Dispense Refill   amLODipine (NORVASC) 10 MG tablet Take 1 tablet (10 mg total) by mouth daily. 90 tablet 1   atorvastatin (LIPITOR) 80 MG tablet Take 0.5 tablets (40 mg total) by mouth at bedtime.     augmented betamethasone dipropionate (DIPROLENE-AF) 0.05 % cream Apply topically 2 (two) times daily as needed (for rash). 60 g 0   cetirizine (ZYRTEC) 10 MG tablet Take 1 tablet (10 mg total) by mouth daily.     ezetimibe (ZETIA) 10 MG tablet Take 1 tablet  (10 mg total) by mouth daily. 90 tablet 3   levothyroxine (SYNTHROID) 125 MCG tablet Take 1 tablet (125 mcg total) by mouth daily before breakfast. 90 tablet 1   metoprolol succinate (TOPROL-XL) 100 MG 24 hr tablet Take 1 tablet (100 mg total) by mouth daily. Take with or immediately following a meal 90 tablet 1   No current facility-administered medications on file prior to visit.    Allergies  Allergen Reactions   Gadolinium Derivatives Nausea And Vomiting    Pt was given IV Zofran per Dr Kathlene Cote prior to imaging for n/v   Pt states prev n/v from gad at GI from [facility]       Physical Exam Vitals:   11/24/22 1127  BP: 135/85   Estimated body mass index is 30.51 kg/m as calculated from the following:   Height as of this encounter: _0  (1.676 m).   Weight as of this encounter: 189 lb (85.7 kg).  EKG (optional): deferred due to virtual visit  GENERAL: alert, oriented, appears well and in no acute distress; visual acuity grossly intact, full vision exam deferred due to pandemic and/or virtual encounter  PSYCH/NEURO: pleasant and cooperative, no obvious depression or anxiety, speech and thought processing grossly intact, Cognitive function grossly intact  Flowsheet Row Video Visit from 11/24/2022 in [place] at [facility]  PHQ-9 Total Score 0           11/24/2022   11:29 AM 10/03/2022   10:32 AM 02/11/2022    1:38 PM 11/08/2021    2:29 PM 07/21/2021    9:31 AM  Depression screen PHQ 2/9  Decreased Interest 0 0 0 0 0  Down, Depressed, Hopeless 0 0 0 0 0  PHQ - 2 Score 0 0 0 0 0  Altered sleeping 0      Tired, decreased energy 0      Change in appetite 0      Feeling bad or failure about yourself  0      Trouble concentrating 0      Moving slowly or fidgety/restless 0      Suicidal thoughts 0      PHQ-9 Score 0      Difficult doing work/chores Not difficult at all           07/21/2021    9:31 AM 11/08/2021    2:28 PM 02/11/2022    1:38 PM 10/03/2022    10:32 AM 11/24/2022   11:29 AM  Fall Risk  Falls in the past year?  0 0 0 0 0  Was there an injury with Fall? 0 0 0 0 0  Fall Risk Category Calculator 0 0 0 0 0  Fall Risk Category _0   Patient Fall Risk Level Low fall risk Low fall risk Low fall risk Low fall risk   Patient at Risk for Falls Due to     No Fall Risks  Fall risk Follow up Falls evaluation completed Falls prevention discussed Falls evaluation completed Falls evaluation completed Falls evaluation completed     SUMMARY AND PLAN:  Medicare annual wellness visit, subsequent    Discussed applicable health maintenance/preventive health measures and advised and referred or ordered per patient preferences:  Health Maintenance  Topic Date Due   DTaP/Tdap/Td (1 - Tdap) Never done   COVID-19 Vaccine (5 - 2023-24 season) 12/10/2022 (Originally 08/26/2022)   INFLUENZA VACCINE  03/26/2023 (Originally 07/26/2022)   Medicare Annual Wellness (AWV)  11/25/2023   COLONOSCOPY (Pts 45-93yr Insurance coverage will need to be confirmed)  09/09/2032   Hepatitis C Screening  Completed   HPV VACCINES  Aged Out   Pneumonia Vaccine 72 Years old  Discontinued   Zoster Vaccines- Shingrix  Discontinued   Prostate cancer screening with PSA done with PCP  Education and counseling on the following was provided based on the above review of health and a plan/checklist for the patient, along with additional information discussed, was provided for the patient in the patient instructions :   -Advised and counseled on healthy lifestyle - including the importance of a healthy diet, regular physical activity, social connections and stress management. -Advised and counseled on a whole foods based healthy diet and regular exercise: discussed a heart healthy whole foods based diet at length. A summary of a healthy diet was provided in the Patient Instructions. Recommended to continue regular exercise and provided guidelines per patient  instructions for aerobic, strength, balance training. -Advise yearly dental visits at minimum and regular eye exams -Advised and counseled on alcohol limits/risks  Follow up: see patient instructions   Patient Instructions  I really enjoyed getting to talk with you today! I am available on Tuesdays and Thursdays for virtual visits if you have any questions or concerns, or if I can be of any further assistance.   CHECKLIST FROM ANNUAL WELLNESS VISIT:  -Follow up (please call to schedule if not scheduled after visit):  -Inperson visit with your Primary Doctor office: per your regular schedule -yearly for annual wellness visit with primary care office  Here is a list of your preventive care/health maintenance measures and the plan for each if any are due:  Health Maintenance  Topic Date Due   DTaP/Tdap/Td (1 - Tdap) Can get with your doctor or pharmacy   COVID-19 Vaccine (5 - 2023-24 season) 08/26/22 - can get with pharmacy   Medicare Annual Wellness ([place]  Done today, due in 1 year   INFLUENZA VACCINE  03/26/2023 (Originally 07/26/2022)   COLONOSCOPY (Pts 45-420yrInsurance coverage will need to be confirmed)  09/09/2032   Hepatitis C Screening  Completed   HPV VACCINES  Aged Out   Pneumonia Vaccine 6563Years old  Discontinued   Zoster Vaccines- Shingrix  Discontinued    -See a dentist at least yearly  -Get your eyes checked and then per your eye specialist's recommendations  -Other issues addressed today:  -I have included below further information regarding a healthy whole foods based diet, physical activity guidelines for adults, stress management and opportunities for  social connections. I hope you find this information useful.    -----------------------------------------------------------------------------------------------------------------------------------------------------------------------------------------------------------------------------------------------------------  FOOD - THE FUEL FOR A HAPPY HEALTHY LIFE: -eat real food: lots of colorful vegetables (half the plate) -5-7 servings of vegetables and fruits per day (fresh or steamed is best), exp. 2 servings of vegetables with lunch and dinner and 2 servings of fruit per day. Berries and greens such as kale and collards are great choices.  -consume on a regular basis: whole grains (make sure first ingredient on label contains the word "whole"), fresh fruits, fish, nuts, seeds, healthy oils (such as olive oil, avocado oil, grape seed oil) -may eat small amounts of dairy and lean meat on occasion, but avoid processed meats such as ham, bacon, lunch meat, etc. -drink water -try to avoid fast food and pre-packaged foods, processed meat -try to avoid foods that contain any ingredients with names you do not recognize  -try to avoid sugar/sweets (except for the natural sugar that occurs in fresh fruit) -try to avoid sweet drinks -try to avoid white rice, white bread, pasta (unless whole grain), white or yellow potatoes  MOVE - the key to keeping your body moving and working best: -if you wish to increase your physical activity, do so gradually and with the approval of your doctor -STOP and seek medical care immediately if you have any chest pain, chest discomfort or trouble breathing when starting or increasing exercise  -move and stretch your body, legs, feet and arms when sitting for long periods -Physical activity guidelines for optimal health in adults: -least 150 minutes per week of aerobic exercise (can talk, but not sing) once approved by your doctor, 20-30 minutes of sustained activity or two 10 minute episodes of sustained activity every day.  -resistance  training at least 2 days per week if approved by your doctor -balance exercises 3+ days per week:   Stand somewhere where you have something sturdy to hold onto if you lose balance.    1) lift up on toes, start with 5x per day and work up to 20x   2) stand and lift on leg straight out to the side so that foot is a few inches of the floor, start with 5x each side and work up to 20x each side   3) stand on one foot, start with 5 seconds each side and work up to 20 seconds on each side  If you need ideas or help with getting more active:  -Silver sneakers https://tools.silversneakers.com  -Walk with a Doc: http://stephens-thompson.biz/  -try to include resistance (weight lifting/strength building) and balance exercises twice per week: or the following link for ideas: ChessContest.fr  UpdateClothing.com.cy  STRESS MANAGEMENT - so important for health and well being -try meditating, or just sitting quietly with deep breathing while intentionally relaxing all parts of your body for 5 minutes daily  SOCIAL CONNECTIONS: -options in [place] if you wish to engage in more social and exercise related activities:  -Silver sneakers https://tools.silversneakers.com  -Walk with a Doc: http://stephens-thompson.biz/  -Check out the [place] 50+ section on the [employer] [place] [employer] (hiking clubs, book clubs, cards and games, chess, exercise classes, aquatic classes and much more) - see the website for details: https://www.[place].gov/departments/parks-recreation/active-adults50  -YouTube has lots of exercise videos for different ages and abilities as well  -[place] (a variety of indoor and outdoor inperson activities for adults). ([phone]. [address].  -Virtual Online Classes (a variety of topics): see seniorplanet.org or call ([phone]  -consider  volunteering at a school, hospice center, church, senior center or elsewhere       [place]:  Everyone should have advanced health care directives in place. This is so that you get the care you want, should you ever be in a situation where you are unable to make your own medical decisions.   From the [place] Advanced Directive Website: "[place] are legal documents in which you give written instructions about your health care if, in the future, you cannot speak for yourself.   A health care power of attorney allows you to name a person you trust to make your health care decisions if you cannot make them yourself. A declaration of a desire for a natural death (or living will) is document, which states that you desire not to have your life prolonged by extraordinary measures if you have a terminal or incurable illness or if you are in a vegetative state. An advance instruction for mental health treatment makes a declaration of instructions, information and preferences regarding your mental health treatment. It also states that you are aware that the advance instruction authorizes a mental health treatment provider to act according to your wishes. It may also outline your consent or refusal of mental health treatment. A declaration of an anatomical gift allows anyone over the age of 76 to make a gift by will, organ donor card or other document."   Please see the following website or an elder law attorney for forms, FAQs and for completion of advanced directives: [place] Secretary of [place] ([email or web address].no)  Or copy and paste the following to your web browser: PokerReunion.com.cy      Lucretia Kern, DO

## 2022-11-24 NOTE — Patient Instructions (Addendum)
I really enjoyed getting to talk with you today! I am available on Tuesdays and Thursdays for virtual visits if you have any questions or concerns, or if I can be of any further assistance.   CHECKLIST FROM ANNUAL WELLNESS VISIT:  -Follow up (please call to schedule if not scheduled after visit):  -Inperson visit with your Primary Doctor office: per your regular schedule -yearly for annual wellness visit with primary care office  Here is a list of your preventive care/health maintenance measures and the plan for each if any are due:  Health Maintenance  Topic Date Due   DTaP/Tdap/Td (1 - Tdap) Can get with your doctor or pharmacy   COVID-19 Vaccine (5 - 2023-24 season) 08/26/22 - can get with pharmacy   Medicare Annual Wellness ([place])  Done today, due in 1 year   INFLUENZA VACCINE  03/26/2023 (Originally 07/26/2022)   COLONOSCOPY (Pts 45-64yr Insurance coverage will need to be confirmed)  09/09/2032   Hepatitis C Screening  Completed   HPV VACCINES  Aged Out   Pneumonia Vaccine 72 Years old  Discontinued   Zoster Vaccines- Shingrix  Discontinued    -See a dentist at least yearly  -Get your eyes checked and then per your eye specialist's recommendations  -Other issues addressed today:  -I have included below further information regarding a healthy whole foods based diet, physical activity guidelines for adults, stress management and opportunities for social connections. I hope you find this information useful.     FOOD - THE FUEL FOR A HAPPY HEALTHY LIFE: -eat real food: lots of colorful vegetables (half the plate) -5-7 servings of vegetables and fruits per day (fresh or steamed is best), exp. 2 servings of vegetables with lunch and  dinner and 2 servings of fruit per day. Berries and greens such as kale and collards are great choices.  -consume on a regular basis: whole grains (make sure first ingredient on label contains the word "whole"), fresh fruits, fish, nuts, seeds, healthy oils (such as olive oil, avocado oil, grape seed oil) -may eat small amounts of dairy and lean meat on occasion, but avoid processed meats such as ham, bacon, lunch meat, etc. -drink water -try to avoid fast food and pre-packaged foods, processed meat -try to avoid foods that contain any ingredients with names you do not recognize  -try to avoid sugar/sweets (except for the natural sugar that occurs in fresh fruit) -try to avoid sweet drinks -try to avoid white rice, white bread, pasta (unless whole grain), white or yellow potatoes  MOVE - the key to keeping your body moving and working best: -if you wish to increase your physical activity, do so gradually and with the approval of your doctor -STOP and seek medical care immediately if you have any chest pain, chest discomfort or trouble breathing when starting or increasing exercise  -move and stretch your body, legs, feet and arms when sitting for long periods -Physical activity guidelines for optimal health in adults: -least 150 minutes per week of aerobic exercise (can talk, but not sing) once approved by your doctor, 20-30 minutes of sustained activity or two 10 minute episodes of sustained activity every day.  -resistance training at least 2 days per week if approved by your doctor -balance exercises 3+ days per week:   Stand somewhere where you have something sturdy to hold onto if you lose balance.    1) lift up on toes, start with 5x per day and work up to 20x   2)  stand and lift on leg straight out to the side so that foot is a few inches of the floor, start with 5x each side and work up to 20x each side   3) stand on one foot, start with 5 seconds each side and work up to 20 seconds on  each side  If you need ideas or help with getting more active:  -Silver sneakers https://tools.silversneakers.com  -Walk with a Doc: http://stephens-thompson.biz/  -try to include resistance (weight lifting/strength building) and balance exercises twice per week: or the following link for ideas: ChessContest.fr  UpdateClothing.com.cy  STRESS MANAGEMENT - so important for health and well being -try meditating, or just sitting quietly with deep breathing while intentionally relaxing all parts of your body for 5 minutes daily  SOCIAL CONNECTIONS: -options in [place] if you wish to engage in more social and exercise related activities:  -Silver sneakers https://tools.silversneakers.com  -Walk with a Doc: http://stephens-thompson.biz/  -Check out the [place] 50+ section on the [place] [employer] (hiking clubs, book clubs, cards and games, chess, exercise classes, aquatic classes and much more) - see the website for details: https://www.[place].gov/departments/parks-recreation/active-adults50  -YouTube has lots of exercise videos for different ages and abilities as well  -[place] (a variety of indoor and outdoor inperson activities for adults). ([phone]. [address]  -Virtual Online Classes (a variety of topics): see seniorplanet.org or call 310-367-9488  -consider volunteering at a school, hospice center, church, senior center or elsewhere       ADVANCED HEALTHCARE DIRECTIVES:  Everyone should have advanced health care directives in place. This is so that you get the care you want, should you ever be in a situation where you are unable to make your own medical decisions.   From the [place] Advanced Directive Website: "[place] are legal documents in which you give written instructions about your health care if,  in the future, you cannot speak for yourself.   A health care power of attorney allows you to name a person you trust to make your health care decisions if you cannot make them yourself. A declaration of a desire for a natural death (or living will) is document, which states that you desire not to have your life prolonged by extraordinary measures if you have a terminal or incurable illness or if you are in a vegetative state. An advance instruction for mental health treatment makes a declaration of instructions, information and preferences regarding your mental health treatment. It also states that you are aware that the advance instruction authorizes a mental health treatment provider to act according to your wishes. It may also outline your consent or refusal of mental health treatment. A declaration of an anatomical gift allows anyone over the age of 95 to make a gift by will, organ donor card or other document."   Please see the following website or an elder law attorney for forms, FAQs and for completion of advanced directives: [place] [employer] [place] ([email or web address].no)  Or copy and paste the following to your web browser: [email or web address].cy

## 2022-11-24 NOTE — Telephone Encounter (Signed)
-----   Message from Larose Kells, MD sent at 11/09/2022  3:29 PM EST ----- Hello, could we please help get this patient scheduled with Dermatology in February 2024?  He wishes to wait as his rashes are mostly in Summer.  I have placed the referral.

## 2022-12-20 ENCOUNTER — Ambulatory Visit: Payer: Medicare HMO | Admitting: Thoracic Surgery (Cardiothoracic Vascular Surgery)

## 2023-01-15 ENCOUNTER — Other Ambulatory Visit: Payer: Self-pay | Admitting: Internal Medicine

## 2023-02-08 ENCOUNTER — Other Ambulatory Visit: Payer: Self-pay | Admitting: Internal Medicine

## 2023-02-16 ENCOUNTER — Ambulatory Visit (INDEPENDENT_AMBULATORY_CARE_PROVIDER_SITE_OTHER): Payer: Medicare HMO | Admitting: Internal Medicine

## 2023-02-16 ENCOUNTER — Encounter: Payer: Self-pay | Admitting: Internal Medicine

## 2023-02-16 VITALS — BP 138/86 | HR 67 | Temp 97.5°F | Resp 16 | Ht 65.0 in | Wt 205.1 lb

## 2023-02-16 DIAGNOSIS — E785 Hyperlipidemia, unspecified: Secondary | ICD-10-CM

## 2023-02-16 DIAGNOSIS — R2231 Localized swelling, mass and lump, right upper limb: Secondary | ICD-10-CM | POA: Diagnosis not present

## 2023-02-16 DIAGNOSIS — R21 Rash and other nonspecific skin eruption: Secondary | ICD-10-CM

## 2023-02-16 DIAGNOSIS — R972 Elevated prostate specific antigen [PSA]: Secondary | ICD-10-CM

## 2023-02-16 DIAGNOSIS — Z0001 Encounter for general adult medical examination with abnormal findings: Secondary | ICD-10-CM | POA: Diagnosis not present

## 2023-02-16 DIAGNOSIS — I10 Essential (primary) hypertension: Secondary | ICD-10-CM | POA: Diagnosis not present

## 2023-02-16 DIAGNOSIS — E039 Hypothyroidism, unspecified: Secondary | ICD-10-CM

## 2023-02-16 DIAGNOSIS — R61 Generalized hyperhidrosis: Secondary | ICD-10-CM | POA: Diagnosis not present

## 2023-02-16 DIAGNOSIS — L235 Allergic contact dermatitis due to other chemical products: Secondary | ICD-10-CM | POA: Diagnosis not present

## 2023-02-16 DIAGNOSIS — Z0000 Encounter for general adult medical examination without abnormal findings: Secondary | ICD-10-CM

## 2023-02-16 MED ORDER — BETAMETHASONE DIPROPIONATE AUG 0.05 % EX CREA: TOPICAL_CREAM | Freq: Two times a day (BID) | CUTANEOUS | 1 refills | Status: DC | PRN

## 2023-02-16 NOTE — Progress Notes (Signed)
Subjective:    Patient ID: Dustin Wall, male    DOB: 1951-09-26, 72 y.o.   MRN: [id number]  DOS:  02/16/2023 Type of visit - description: cpx  Here for CPX Chronic medical problems were managed Also has other complaints.  + Nocturnal chills and shakes for 1 month, does not happen every night.  No associated with fever.  No weight loss. Denies nausea vomiting or diarrhea.   Has a small amount of red blood in the toilet paper for  few months , only w/ BMs otherwise no abnormal stools. No gross hematuria.  No LUTS. No chest pain no cough  Has noted lump at the right middle finger, it bothers him.  Would like something done about it   Review of Systems  Other than above, a 14 point review of systems is negative    Past Medical History:  Diagnosis Date   Ascending aortic aneurysm ([place])    dx 2010 ~ 4.4 cm, ..4.7cm 11/11/21   GERD (gastroesophageal reflux disease)    Hyperlipidemia    Hypertension    Hypothyroidism    s/p hyperthyroidism, s/p radioiodine    Past Surgical History:  Procedure Laterality Date   COLONOSCOPY     ?2012 in [place]  12/16/2014   Social History   Socioeconomic History   Marital status: Married    Spouse name: Not on file   Number of children: 0   Years of education: Not on file   Highest education level: Not on file  Occupational History   Occupation: fully retired at age 3, sanitation  Tobacco Use   Smoking status: Never   Smokeless tobacco: Never  Vaping Use   Vaping Use: Never used  Substance and Sexual Activity   Alcohol use: Yes    Comment: beer and wine occasional   Drug use: No   Sexual activity: Yes  Other Topics Concern   Not on file  Social History Narrative   Lives w/ wife   Social Determinants of Health   Financial Resource Strain: Low Risk  (11/08/2021)   Overall Financial Resource Strain (CARDIA)    Difficulty of Paying Living Expenses: Not hard at all  Food  Insecurity: No Food Insecurity (11/08/2021)   Hunger Vital Sign    Worried About Running Out of Food in the Last Year: Never true    [place] in the Last Year: Never true  Transportation Needs: No Transportation Needs (11/08/2021)   PRAPARE - Hydrologist (Medical): No    Lack of Transportation (Non-Medical): No  Physical Activity: Sufficiently Active (11/08/2021)   Exercise Vital Sign    Days of Exercise per Week: 5 days    Minutes of Exercise per Session: 60 min  Stress: No Stress Concern Present (11/08/2021)   [place]    Feeling of Stress : Not at all  Social Connections: Socially Isolated (11/08/2021)   Social Connection and Isolation Panel [NHANES]    Frequency of Communication with Friends and Family: Once a week    Frequency of Social Gatherings with Friends and Family: Once a week    Attends Religious Services: Never    Marine scientist or Organizations: No    Attends Archivist Meetings: Never    Marital Status: Married  Human resources officer Violence: Not At Risk (11/08/2021)   Humiliation, Afraid, Rape, and Kick  questionnaire    Fear of Current or Ex-Partner: No    Emotionally Abused: No    Physically Abused: No    Sexually Abused: No     Current Outpatient Medications  Medication Instructions   amLODipine (NORVASC) 10 mg, Oral, Daily   atorvastatin (LIPITOR) 80 MG tablet TAKE 1 TABLET BY MOUTH EVERYDAY AT BEDTIME   augmented betamethasone dipropionate (DIPROLENE-AF) 0.05 % cream Topical, 2 times daily PRN   cetirizine (ZYRTEC) 10 mg, Oral, Daily   ezetimibe (ZETIA) 10 mg, Oral, Daily   levothyroxine (SYNTHROID) 125 mcg, Oral, Daily before breakfast   metoprolol succinate (TOPROL-XL) 100 mg, Oral, Daily, Take with or immediately following a meal       Objective:   Physical Exam BP 138/86   Pulse 67   Temp (!) 97.5 F (36.4 C) (Oral)   Resp 16    Ht '5\' 5"'$  (1.651 m)   Wt 205 lb 2 oz (93 kg)   SpO2 96%   BMI 34.13 kg/m  General: Well developed, NAD, BMI noted Neck: No  thyromegaly  HEENT:  Normocephalic . Face symmetric, atraumatic Lymphatic system: No LAD's at the supraclavicular, neck, axillary or groin areas. Lungs:  CTA B Normal respiratory effort, no intercostal retractions, no accessory muscle use. Heart: RRR,  no murmur.  Abdomen:  Not distended, soft, non-tender. No rebound or rigidity DRE: Normal stools, prostate essentially normal..   Lower extremities: no pretibial edema bilaterally R middle finger: Cannot extend all aspects has a 3 mm lump, not red, no fluctuant, slightly TTP. Skin: Exposed areas without rash. Not pale. Not jaundice Neurologic:  alert & oriented X3.  Speech normal, gait appropriate for age and unassisted Strength symmetric and appropriate for age.  Psych: Cognition and judgment appear intact.  Cooperative with normal attention span and concentration.  Behavior appropriate. No anxious or depressed appearing.     Assessment     Assessment HTN Hyperlipidemia Hypothyroidism (hypothyroidism  > S/P  radioiodine) Ascending aortic aneurysm DX 2010, 4.5 cm. Sees Dr. Roxan Hockey Vitiligo. Acanthosis Nigricans Varicose veins  COVID infection  04/2021.  PLAN: Here for CPX HTN: BP satisfactory, recommend to check at home, continue amlodipine.  Check labs High cholesterol: On Lipitor and Zetia, good compliance and tolerance per patient.  Check labs Hypothyroidism: On Synthroid check TSH. Ascending aortic aneurysm: Last visit with vascular surgery November 2023. Lump at the right middle finger: Possibly a cyst, recommend observation, patient adamant  about further evaluation, refer to hand surgery. Night sweats: As described above, no weight loss, no lymphadenopathies.  Will check a CBC with smear, UA and reassess on RTC.  Patient to call if symptoms increase. Prostatitis : was dx  last year,  now asymptomatic.  Labs Rash: Has a persistent rash on the lower extremities, request topical steroids to be refilled.  Rx sent.  Apparently is planning to see a dermatologist, recommend to not use any creams prior to the visit. RTC 4 months   Addition to CPX, we address all his chronic medical problems and new problems including a lump on the right middle finger.

## 2023-02-16 NOTE — Patient Instructions (Addendum)
Vaccines I recommend: PNM 20, Shingrix, flu shot, COVID booster if not done after 08-2022, RSV  Will refer you to the hand surgeon  If you have increased night sweats or you develop fever, chills and weight loss: Let me know  Check the  blood pressure regularly BP GOAL is between 110/65 and  135/85. If it is consistently higher or lower, let me know     GO TO THE LAB : Get the blood work     [place], [place] back for   a checkup in 4 months    "[place] of attorney" ,  "Living will" (Advance care planning documents)  If you already have a living will or healthcare power of attorney, is recommended you bring the copy to be scanned in your chart.   The document will be available to all the doctors you see in the system.  Advance care planning is a process that supports adults in  understanding and sharing their preferences regarding future medical care.  The patient's preferences are recorded in documents called Advance Directives and the can be modified at any time while the patient is in full mental capacity.   If you don't have one, please consider create one.      More information at: [email or web address]

## 2023-02-17 ENCOUNTER — Encounter: Payer: Self-pay | Admitting: Internal Medicine

## 2023-02-17 LAB — COMPREHENSIVE METABOLIC PANEL
AG Ratio: 1.4 (calc) (ref 1.0–2.5)
ALT: 35 U/L (ref 9–46)
AST: 29 U/L (ref 10–35)
Albumin: 4.3 g/dL (ref 3.6–5.1)
Alkaline phosphatase (APISO): 64 U/L (ref 35–144)
BUN: 13 mg/dL (ref 7–25)
CO2: 25 mmol/L (ref 20–32)
Calcium: 9.7 mg/dL (ref 8.6–10.3)
Chloride: 103 mmol/L (ref 98–110)
Creat: 0.82 mg/dL (ref 0.70–1.28)
Globulin: 3.1 g/dL (calc) (ref 1.9–3.7)
Glucose, Bld: 93 mg/dL (ref 65–99)
Potassium: 4.7 mmol/L (ref 3.5–5.3)
Sodium: 138 mmol/L (ref 135–146)
Total Bilirubin: 0.6 mg/dL (ref 0.2–1.2)
Total Protein: 7.4 g/dL (ref 6.1–8.1)

## 2023-02-17 LAB — LIPID PANEL
Cholesterol: 126 mg/dL (ref <200)
HDL: 51 mg/dL (ref >=40)
LDL Cholesterol (Calc): 57 mg/dL (calc)
Non-HDL Cholesterol (Calc): 75 mg/dL (calc) (ref <130)
Total CHOL/HDL Ratio: 2.5 (calc) (ref <5.0)
Triglycerides: 95 mg/dL (ref <150)

## 2023-02-17 LAB — URINALYSIS, ROUTINE W REFLEX MICROSCOPIC
Bilirubin Urine: NEGATIVE
Glucose, UA: NEGATIVE
Hgb urine dipstick: NEGATIVE
Ketones, ur: NEGATIVE
Leukocytes,Ua: NEGATIVE
Nitrite: NEGATIVE
Protein, ur: NEGATIVE
Specific Gravity, Urine: 1.015 (ref 1.001–1.035)
pH: 6.5 (ref 5.0–8.0)

## 2023-02-17 LAB — CBC WITH DIFFERENTIAL/PLATELET
Absolute Monocytes: 428 cells/uL (ref 200–950)
Basophils Absolute: 21 cells/uL (ref 0–200)
Basophils Relative: 0.5 %
Eosinophils Absolute: 80 cells/uL (ref 15–500)
Eosinophils Relative: 1.9 %
HCT: 45.3 % (ref 38.5–50.0)
Hemoglobin: 14.8 g/dL (ref 13.2–17.1)
Lymphs Abs: 2079 cells/uL (ref 850–3900)
MCH: 27.7 pg (ref 27.0–33.0)
MCHC: 32.7 g/dL (ref 32.0–36.0)
MCV: 84.7 fL (ref 80.0–100.0)
MPV: 10.4 fL (ref 7.5–12.5)
Monocytes Relative: 10.2 %
Neutro Abs: 1592 cells/uL (ref 1500–7800)
Neutrophils Relative %: 37.9 %
Platelets: 185 10*3/uL (ref 140–400)
RBC: 5.35 10*6/uL (ref 4.20–5.80)
RDW: 14 % (ref 11.0–15.0)
Total Lymphocyte: 49.5 %
WBC: 4.2 10*3/uL (ref 3.8–10.8)

## 2023-02-17 LAB — PATHOLOGIST SMEAR REVIEW

## 2023-02-17 LAB — TSH: TSH: 0.9 mIU/L (ref 0.40–4.50)

## 2023-02-17 LAB — PSA: PSA: 2.18 ng/mL (ref <=4.00)

## 2023-02-17 NOTE — Assessment & Plan Note (Signed)
Here for CPX HTN: BP satisfactory, recommend to check at home, continue amlodipine.  Check labs High cholesterol: On Lipitor and Zetia, good compliance and tolerance per patient.  Check labs Hypothyroidism: On Synthroid check TSH. Ascending aortic aneurysm: Last visit with vascular surgery November 2023. Lump at the right middle finger: Possibly a cyst, recommend observation, patient adamant  about further evaluation, refer to hand surgery. Night sweats: As described above, no weight loss, no lymphadenopathies.  Will check a CBC with smear, UA and reassess on RTC.  Patient to call if symptoms increase. Prostatitis : was dx  last year, now asymptomatic.  Labs Rash: Has a persistent rash on the lower extremities, request topical steroids to be refilled.  Rx sent.  Apparently is planning to see a dermatologist, recommend to not use any creams prior to the visit. RTC 4 months

## 2023-02-17 NOTE — Assessment & Plan Note (Signed)
  Vaccines I recommend: PNM 20, Shingrix, flu shot, COVID booster if not done after 08-2022, RSV - zostavax 2013  (pt denies) -CCS: Normal colonoscopy December 2012 (had external hemorrhoids).  C-scope [id number].  Next per GI  -Prostate cancer screening:  +FH 2 brothers, had prostatitis 05/09/2022, PSA 7.7, subsequently 2.4.  Now w/ no symptoms, DRE negative, check PSA.  -Lifestyle: Encouraged physical activity and healthy eating. -Labs: CMP FLP CBC with pathology smear, TSH, PSA. -Healthcare POA discussed.

## 2023-03-29 DIAGNOSIS — M79644 Pain in right finger(s): Secondary | ICD-10-CM | POA: Diagnosis not present

## 2023-03-29 DIAGNOSIS — R2231 Localized swelling, mass and lump, right upper limb: Secondary | ICD-10-CM | POA: Diagnosis not present

## 2023-04-03 ENCOUNTER — Other Ambulatory Visit: Payer: Self-pay

## 2023-04-03 ENCOUNTER — Encounter (HOSPITAL_BASED_OUTPATIENT_CLINIC_OR_DEPARTMENT_OTHER): Payer: Self-pay | Admitting: Orthopedic Surgery

## 2023-04-04 NOTE — H&P (Signed)
Preoperative History & Physical Exam  Surgeon: Dustin OvensJames Reid Spears, MD  Diagnosis: right middle finger mass  Planned Procedure: Procedure(s) (LRB): Right middle finger ulnar proximal phalanx mass excision (Right)  History of Present Illness:   Patient is a 72 y.o. male with symptoms consistent with right middle finger mass who presents for surgical intervention. The risks, benefits and alternatives of surgical intervention were discussed and informed consent was obtained prior to surgery.  Past Medical History:  Past Medical History:  Diagnosis Date   Ascending aortic aneurysm    dx 2010 ~ 4.4 cm, ..4.7cm 11/11/21   GERD (gastroesophageal reflux disease)    Hyperlipidemia    Hypertension    Hypothyroidism    s/p hyperthyroidism, s/p radioiodine    Past Surgical History:  Past Surgical History:  Procedure Laterality Date   [place]ONOSCOPY     ?2012 [place]   HEMORRHOID SURGERY     UPPER GASTROINTESTINAL ENDOSCOPY  12/16/2014    Medications:  Prior to Admission medications   Medication Sig Start Date End Date Taking? Authorizing Provider  amLODipine (NORVASC) 10 MG tablet Take 1 tablet (10 mg total) by mouth daily. 10/17/22  Yes Wall, Nolon RodJose E, MD  atorvastatin (LIPITOR) 80 MG tablet TAKE 1 TABLET BY MOUTH EVERYDAY AT BEDTIME 02/08/23  Yes Wall, Nolon RodJose E, MD  ezetimibe (ZETIA) 10 MG tablet Take 1 tablet (10 mg total) by mouth daily. 02/11/22  Yes Wanda PlumpPaz, Dustin E, MD  levothyroxine (SYNTHROID) 125 MCG tablet Take 1 tablet (125 mcg total) by mouth daily before breakfast. 01/16/23  Yes Wall, Nolon RodJose E, MD  metoprolol succinate (TOPROL-XL) 100 MG 24 hr tablet Take 1 tablet (100 mg total) by mouth daily. Take with or immediately following a meal 10/19/22  Yes Wall, Nolon RodJose E, MD  Multiple Vitamin (MULTI VITAMIN MENS PO) Take by mouth.   Yes [provider]  augmented betamethasone dipropionate (DIPROLENE-AF) 0.05 % cream Apply topically 2 (two) times daily as needed (for rash). 02/16/23   Wanda PlumpPaz, Dustin E,  MD  cetirizine (ZYRTEC) 10 MG tablet Take 1 tablet (10 mg total) by mouth daily. 10/03/22   Wanda PlumpPaz, Dustin E, MD    Allergies:  Gadolinium derivatives  Review of Systems: Negative except per HPI.  Physical Exam: Alert and oriented, NAD Head and neck: no masses, normal alignment CV: pulse intact Pulm: no increased work of breathing, respirations even and unlabored Abdomen: non-distended Extremities: extremities warm and well perfused  LABS: Recent Results (from the past 2160 hour(s))  Comp Met (CMET)     Status: None   Collection Time: 02/16/23 10:53 AM  Result Value Ref Range   Glucose, Bld 93 65 - 99 mg/dL    Comment: .            Fasting reference interval .    BUN 13 7 - 25 mg/dL   Creat 1.610.82 0.960.70 - 0.451.28 mg/dL   BUN/Creatinine Ratio SEE NOTE: 6 - 22 (calc)    Comment:    Not Reported: BUN and Creatinine are within    reference range. .    Sodium 138 135 - 146 mmol/L   Potassium 4.7 3.5 - 5.3 mmol/L   Chloride 103 98 - 110 mmol/L   CO2 25 20 - 32 mmol/L   Calcium 9.7 8.6 - 10.3 mg/dL   Total Protein 7.4 6.1 - 8.1 g/dL   Albumin 4.3 3.6 - 5.1 g/dL   Globulin 3.1 1.9 - 3.7 g/dL (calc)   AG Ratio 1.4 1.0 - 2.5 (  calc)   Total Bilirubin 0.6 0.2 - 1.2 mg/dL   Alkaline phosphatase (APISO) 64 35 - 144 U/L   AST 29 10 - 35 U/L   ALT 35 9 - 46 U/L  Lipid panel     Status: None   Collection Time: 02/16/23 10:53 AM  Result Value Ref Range   Cholesterol 126 <200 mg/dL   HDL 51 > OR = 40 mg/dL   Triglycerides 95 <103 mg/dL   LDL Cholesterol (Calc) 57 mg/dL (calc)    Comment: Reference range: <100 . Desirable range <100 mg/dL for primary prevention;   <70 mg/dL for patients with CHD or diabetic patients  with > or = 2 CHD risk factors. Marland Kitchen LDL-C is now calculated using the Martin-Hopkins  calculation, which is a validated novel method providing  better accuracy than the Friedewald equation in the  estimation of LDL-C.  Dustin Wall et al. Lenox Ahr. 1594;585(92): 2061-2068   (http://education.[facility].com/faq/FAQ164)    Total CHOL/HDL Ratio 2.5 <5.0 (calc)   Non-HDL Cholesterol (Calc) 75 <924 mg/dL (calc)    Comment: For patients with diabetes plus 1 major ASCVD risk  factor, treating to a non-HDL-C goal of <100 mg/dL  (LDL-C of <46 mg/dL) is considered a therapeutic  option.   CBC w/Diff     Status: None   Collection Time: 02/16/23 10:53 AM  Result Value Ref Range   WBC 4.2 3.8 - 10.8 Thousand/uL   RBC 5.35 4.20 - 5.80 Million/uL   Hemoglobin 14.8 13.2 - 17.1 g/dL   HCT 28.6 38.1 - 77.1 %   MCV 84.7 80.0 - 100.0 fL   MCH 27.7 27.0 - 33.0 pg   MCHC 32.7 32.0 - 36.0 g/dL   RDW 16.5 79.0 - 38.3 %   Platelets 185 140 - 400 Thousand/uL   MPV 10.4 7.5 - 12.5 fL   Neutro Abs 1,592 1,500 - 7,800 cells/uL   Lymphs Abs 2,079 850 - 3,900 cells/uL   Absolute Monocytes 428 200 - 950 cells/uL   Eosinophils Absolute 80 15 - 500 cells/uL   Basophils Absolute 21 0 - 200 cells/uL   Neutrophils Relative % 37.9 %   Total Lymphocyte 49.5 %   Monocytes Relative 10.2 %   Eosinophils Relative 1.9 %   Basophils Relative 0.5 %  Pathologist smear review     Status: None   Collection Time: 02/16/23 10:53 AM  Result Value Ref Range   Path Review      Comment: Myeloid population consists predominantly of mature segmented neutrophils with reactive changes. Rare atypical lymphs. Review of the peripheral smear reveals adequate numbers of platelets. Scattered Elliptocytes and Target Cells. Reviewed by Dustin Massed Mammarappallil, MD  (Electronic Signature on File)     02/17/2023   TSH     Status: None   Collection Time: 02/16/23 10:53 AM  Result Value Ref Range   TSH 0.90 0.40 - 4.50 mIU/L  PSA     Status: None   Collection Time: 02/16/23 10:53 AM  Result Value Ref Range   PSA 2.18 < OR = 4.00 ng/mL    Comment: The total PSA value from this assay system is  standardized against the WHO standard. The test  result will be approximately 20% lower when compared   to the equimolar-standardized total PSA (Beckman  Coulter). Comparison of serial PSA results should be  interpreted with this fact in mind. . This test was performed using the Siemens  chemiluminescent method. Values obtained from  different assay methods cannot  be used interchangeably. PSA levels, regardless of value, should not be interpreted as absolute evidence of the presence or absence of disease.   Urinalysis, Routine w reflex microscopic     Status: None   Collection Time: 02/16/23 10:53 AM  Result Value Ref Range   Color, Urine YELLOW YELLOW   APPearance CLEAR CLEAR   Specific Gravity, Urine 1.015 1.001 - 1.035   pH 6.5 5.0 - 8.0   Glucose, UA NEGATIVE NEGATIVE   Bilirubin Urine NEGATIVE NEGATIVE   Ketones, ur NEGATIVE NEGATIVE   Hgb urine dipstick NEGATIVE NEGATIVE   Protein, ur NEGATIVE NEGATIVE   Nitrite NEGATIVE NEGATIVE   Leukocytes,Ua NEGATIVE NEGATIVE     Complete History and Physical exam available in the office notes  Dustin Wall F Kyriaki Moder

## 2023-04-04 NOTE — Progress Notes (Signed)
Chart reviewed with Dr. Tacy Dura due to history of TAA. Ok to proceed with planned surgery at [facility].

## 2023-04-06 ENCOUNTER — Encounter (HOSPITAL_BASED_OUTPATIENT_CLINIC_OR_DEPARTMENT_OTHER)
Admission: RE | Admit: 2023-04-06 | Discharge: 2023-04-06 | Disposition: A | Discharge status: Home / Self Care | Payer: Medicare HMO | Source: Ambulatory Visit | Attending: Orthopedic Surgery | Admitting: Orthopedic Surgery

## 2023-04-06 DIAGNOSIS — Z01812 Encounter for preprocedural laboratory examination: Secondary | ICD-10-CM | POA: Insufficient documentation

## 2023-04-10 ENCOUNTER — Encounter: Payer: Self-pay | Admitting: *Deleted

## 2023-04-11 ENCOUNTER — Other Ambulatory Visit: Payer: Self-pay

## 2023-04-11 ENCOUNTER — Ambulatory Visit (HOSPITAL_BASED_OUTPATIENT_CLINIC_OR_DEPARTMENT_OTHER)
Admission: RE | Admit: 2023-04-11 | Discharge: 2023-04-11 | Disposition: A | Discharge status: Home / Self Care | Payer: Medicare HMO | Attending: Orthopedic Surgery | Admitting: Orthopedic Surgery

## 2023-04-11 ENCOUNTER — Ambulatory Visit (HOSPITAL_BASED_OUTPATIENT_CLINIC_OR_DEPARTMENT_OTHER): Payer: Medicare HMO | Admitting: Anesthesiology

## 2023-04-11 ENCOUNTER — Encounter (HOSPITAL_BASED_OUTPATIENT_CLINIC_OR_DEPARTMENT_OTHER)
Admission: RE | Disposition: A | Discharge status: Home / Self Care | Payer: Self-pay | Source: Home / Self Care | Attending: Orthopedic Surgery

## 2023-04-11 ENCOUNTER — Encounter (HOSPITAL_BASED_OUTPATIENT_CLINIC_OR_DEPARTMENT_OTHER): Payer: Self-pay | Admitting: Orthopedic Surgery

## 2023-04-11 DIAGNOSIS — E039 Hypothyroidism, unspecified: Secondary | ICD-10-CM

## 2023-04-11 DIAGNOSIS — I739 Peripheral vascular disease, unspecified: Secondary | ICD-10-CM

## 2023-04-11 DIAGNOSIS — Z79899 Other long term (current) drug therapy: Secondary | ICD-10-CM | POA: Insufficient documentation

## 2023-04-11 DIAGNOSIS — R2231 Localized swelling, mass and lump, right upper limb: Secondary | ICD-10-CM | POA: Diagnosis not present

## 2023-04-11 DIAGNOSIS — I10 Essential (primary) hypertension: Secondary | ICD-10-CM

## 2023-04-11 DIAGNOSIS — M67441 Ganglion, right hand: Secondary | ICD-10-CM | POA: Insufficient documentation

## 2023-04-11 DIAGNOSIS — Z01818 Encounter for other preprocedural examination: Secondary | ICD-10-CM

## 2023-04-11 HISTORY — PX: MASS EXCISION: SHX2000

## 2023-04-11 SURGERY — EXCISION MASS
Anesthesia: Monitor Anesthesia Care | Site: Finger | Laterality: Right

## 2023-04-11 MED ORDER — LIDOCAINE HCL 1 % IJ SOLN
INTRAMUSCULAR | Status: DC | PRN
  Administered 2023-04-11: 5 mL

## 2023-04-11 MED ORDER — PROPOFOL 10 MG/ML IV BOLUS
INTRAVENOUS | Status: DC | PRN
  Administered 2023-04-11 (×2): 70 mg via INTRAVENOUS

## 2023-04-11 MED ORDER — CEFAZOLIN SODIUM-DEXTROSE 2-4 GM/100ML-% IV SOLN
INTRAVENOUS | Status: AC
  Filled 2023-04-11: qty 100

## 2023-04-11 MED ORDER — CEFAZOLIN SODIUM-DEXTROSE 2-4 GM/100ML-% IV SOLN
2.0000 g | INTRAVENOUS | Status: AC
  Administered 2023-04-11: 2 g via INTRAVENOUS

## 2023-04-11 MED ORDER — PROPOFOL 500 MG/50ML IV EMUL
INTRAVENOUS | Status: DC | PRN
  Administered 2023-04-11: 50 ug/kg/min via INTRAVENOUS

## 2023-04-11 MED ORDER — BACITRACIN ZINC 500 UNIT/GM EX OINT
TOPICAL_OINTMENT | CUTANEOUS | Status: DC | PRN
  Administered 2023-04-11 (×2): 1 via TOPICAL

## 2023-04-11 MED ORDER — FENTANYL CITRATE (PF) 100 MCG/2ML IJ SOLN
INTRAMUSCULAR | Status: DC | PRN
  Administered 2023-04-11 (×2): 50 ug via INTRAVENOUS

## 2023-04-11 MED ORDER — BUPIVACAINE HCL (PF) 0.5 % IJ SOLN
INTRAMUSCULAR | Status: DC | PRN
  Administered 2023-04-11: 10 mL

## 2023-04-11 MED ORDER — LACTATED RINGERS IV SOLN: INTRAVENOUS | Status: DC

## 2023-04-11 MED ORDER — ACETAMINOPHEN 500 MG PO TABS
1000.0000 mg | ORAL_TABLET | Freq: Once | ORAL | Status: AC
  Administered 2023-04-11: 1000 mg via ORAL

## 2023-04-11 MED ORDER — ONDANSETRON HCL 4 MG/2ML IJ SOLN
INTRAMUSCULAR | Status: DC | PRN
  Administered 2023-04-11: 4 mg via INTRAVENOUS

## 2023-04-11 MED ORDER — FENTANYL CITRATE (PF) 100 MCG/2ML IJ SOLN
INTRAMUSCULAR | Status: AC
  Filled 2023-04-11: qty 2

## 2023-04-11 MED ORDER — OXYCODONE HCL 5 MG PO TABS: 5.0000 mg | ORAL_TABLET | Freq: Once | ORAL | Status: DC | PRN

## 2023-04-11 MED ORDER — ACETAMINOPHEN 500 MG PO TABS
ORAL_TABLET | ORAL | Status: AC
  Filled 2023-04-11: qty 2

## 2023-04-11 MED ORDER — OXYCODONE HCL 5 MG/5ML PO SOLN: 5.0000 mg | Freq: Once | ORAL | Status: DC | PRN

## 2023-04-11 MED ORDER — FENTANYL CITRATE (PF) 100 MCG/2ML IJ SOLN: 25.0000 ug | INTRAMUSCULAR | Status: DC | PRN

## 2023-04-11 MED ORDER — 0.9 % SODIUM CHLORIDE (POUR BTL) OPTIME
TOPICAL | Status: DC | PRN
  Administered 2023-04-11: 1000 mL

## 2023-04-11 MED ORDER — AMISULPRIDE (ANTIEMETIC) 5 MG/2ML IV SOLN: 10.0000 mg | Freq: Once | INTRAVENOUS | Status: DC | PRN

## 2023-04-11 SURGICAL SUPPLY — 30 items
BLADE SURG 15 STRL LF DISP TIS (BLADE) ×1 IMPLANT
BLADE SURG 15 STRL SS (BLADE) ×1
BNDG CMPR 5X4 KNIT ELC UNQ LF (GAUZE/BANDAGES/DRESSINGS) ×1
BNDG CMPR 9X4 STRL LF SNTH (GAUZE/BANDAGES/DRESSINGS) ×1
BNDG ELASTIC 4INX 5YD STR LF (GAUZE/BANDAGES/DRESSINGS) ×1 IMPLANT
BNDG ESMARK 4X9 LF (GAUZE/BANDAGES/DRESSINGS) ×1 IMPLANT
CORD BIPOLAR FORCEPS 12FT (ELECTRODE) IMPLANT
COVER BACK TABLE 60X90IN (DRAPES) ×1 IMPLANT
CUFF TOURN SGL QUICK 18X4 (TOURNIQUET CUFF) ×1 IMPLANT
DRAPE EXTREMITY T 121X128X90 (DISPOSABLE) ×1 IMPLANT
DRAPE SURG 17X23 STRL (DRAPES) ×1 IMPLANT
DRSG EMULSION OIL 3X3 NADH (GAUZE/BANDAGES/DRESSINGS) ×1 IMPLANT
GAUZE SPONGE 4X4 12PLY STRL (GAUZE/BANDAGES/DRESSINGS) ×1 IMPLANT
GLOVE BIOGEL PI IND STRL 7.5 (GLOVE) ×1 IMPLANT
GOWN STRL REUS W/ TWL LRG LVL3 (GOWN DISPOSABLE) ×1 IMPLANT
GOWN STRL REUS W/TWL LRG LVL3 (GOWN DISPOSABLE) ×1
GOWN STRL REUS W/TWL XL LVL3 (GOWN DISPOSABLE) ×1 IMPLANT
KNIFE CARPAL TUNNEL (BLADE) ×1 IMPLANT
NDL HYPO 22X1.5 SAFETY MO (MISCELLANEOUS) ×1 IMPLANT
NEEDLE HYPO 22X1.5 SAFETY MO (MISCELLANEOUS) ×1 IMPLANT
NS IRRIG 1000ML POUR BTL (IV SOLUTION) ×1 IMPLANT
PACK BASIN DAY SURGERY FS (CUSTOM PROCEDURE TRAY) ×1 IMPLANT
PADDING CAST ABS COTTON 4X4 ST (CAST SUPPLIES) ×1 IMPLANT
SHEET MEDIUM DRAPE 40X70 STRL (DRAPES) ×1 IMPLANT
SUT ETHILON 4 0 PS 2 18 (SUTURE) ×1 IMPLANT
SYR 10ML LL (SYRINGE) ×1 IMPLANT
SYR BULB EAR ULCER 3OZ GRN STR (SYRINGE) ×1 IMPLANT
TOWEL GREEN STERILE FF (TOWEL DISPOSABLE) ×2 IMPLANT
TRAY DSU PREP LF (CUSTOM PROCEDURE TRAY) ×1 IMPLANT
UNDERPAD 30X36 HEAVY ABSORB (UNDERPADS AND DIAPERS) ×1 IMPLANT

## 2023-04-11 NOTE — Interval H&P Note (Signed)
History and Physical Interval Note:  04/11/2023 10:30 AM  Dustin Wall  has presented today for surgery, with the diagnosis of right middle finger mass.  The various methods of treatment have been discussed with the patient and family. After consideration of risks, benefits and other options for treatment, the patient has consented to  Procedure(s): Right middle finger ulnar proximal phalanx mass excision (Right) as a surgical intervention.  The patient's history has been reviewed, patient examined, no change in status, stable for surgery.  I have reviewed the patient's chart and labs.  Questions were answered to the patient's satisfaction.     Gomez Cleverly

## 2023-04-11 NOTE — Discharge Instructions (Addendum)
Orthopaedic Hand Surgery Discharge Instructions  WEIGHT BEARING STATUS: Non weight bearing on operative extremity  INCISION CARE: Keep dressing over your incision clean and dry until 5 days after surgery. You may shower by placing a waterproof covering over your dressing. Once dressing is removed, you may allow water to run over the incision and then place Band-Aids over incision. Do not scrub your incision or apply creams/lotions. Do not submerge your incision or swim for 3 weeks after surgery. Contact your surgeon or primary care doctor if you develop redness or drainage from your incision.   PAIN CONTROL: First line medications for post operative pain control are Tylenol (acetaminophen) and Motrin (ibuprofen) if you are able to take these medications. If you have been prescribed a medication these can be taken as breakthrough pain medications. Please note that some narcotic pain medication has acetaminophen added and you should never consume more than 4,000mg  of acetaminophen in 24-hour period. Please note that if you are given Toradol (ketorolac) you should not take similar medications such as ibuprofen or naproxen.  DISCHARGE MEDICATIONS: If you have been prescribed medication it was sent electronically to your pharmacy. No changes have been made to your home medications.  ICE/ELEVATION: Ice and elevate your injured extremity as needed. Avoid direct contact of ice with skin.   BANDAGE FEELS TOO TIGHT: If your bandage feels too tight, first make sure you are elevating your fingers as much as possible. The outer layer of the bandage can be unwrapped and reapplied more loosely. If no improvement, you may carefully cut the inner layer longitudinally until the pressure has resolved and then rewrap the outer layer. If you are not comfortable with these instructions, please call the office and the bandage can be changed for you.   FOLLOW UP: You will be called after surgery with an appointment date and  time, however if you have not received a phone call within 3 days, please call during regular office hours at [phone] to schedule a post operative appointment.  Please Seek Medical Attention if: Call MD for: pain or pressure in chest, jaw, arm, back, neck  Call MD for: temperature greater than 101 F for more than 24 hrs Call MD for: difficulty breathing Call MD for: incision redness, bleeding, drainage  Call MD for: palpitations or feeling that the heart is racing  Call MD for: increased swelling in arm, leg, ankle, or abdomen  Call MD for: lightheadedness, dizziness, fainting Call 911 or go to ER for any medical emergency if you are not able to get in touch with your doctor   J. Standley Dakins, MD Orthopaedic Hand Surgeon [facility] Office number: [phone] [address] [place], [place] [address]  No tylenol until 2:45pm   Post Anesthesia Home Care Instructions  Activity: Get plenty of rest for the remainder of the day. A responsible individual must stay with you for 24 hours following the procedure.  For the next 24 hours, DO NOT: -Drive a car -Advertising copywriter -Drink alcoholic beverages -Take any medication unless instructed by your physician -Make any legal decisions or sign important papers.  Meals: Start with liquid foods such as gelatin or soup. Progress to regular foods as tolerated. Avoid greasy, spicy, heavy foods. If nausea and/or vomiting occur, drink only clear liquids until the nausea and/or vomiting subsides. Call your physician if vomiting continues.  Special Instructions/Symptoms: Your throat may feel dry or sore from the anesthesia or the breathing tube placed in your throat during surgery. If this  causes discomfort, gargle with warm salt water. The discomfort should disappear within 24 hours.  If you had a scopolamine patch placed behind your ear for the management of post- operative nausea and/or vomiting:  1. The medication in the  patch is effective for 72 hours, after which it should be removed.  Wrap patch in a tissue and discard in the trash. Wash hands thoroughly with soap and water. 2. You may remove the patch earlier than 72 hours if you experience unpleasant side effects which may include dry mouth, dizziness or visual disturbances. 3. Avoid touching the patch. Wash your hands with soap and water after contact with the patch.

## 2023-04-11 NOTE — Transfer of Care (Signed)
Immediate Anesthesia Transfer of Care Note  Patient: Kannen Moxey  Procedure(s) Performed: Right middle finger ulnar proximal phalanx mass excision (Right: Finger)  Patient Location: PACU  Anesthesia Type:MAC  Level of Consciousness: drowsy  Airway & Oxygen Therapy: Patient Spontanous Breathing and Patient connected to face mask oxygen  Post-op Assessment: Report given to RN and Post -op Vital signs reviewed and stable  Post vital signs: Reviewed and stable  Last Vitals:  Vitals Value Taken Time  BP 104/74 04/11/23 1138  Temp 36.1 C 04/11/23 1138  Pulse 53 04/11/23 1143  Resp 18 04/11/23 1143  SpO2 98 % 04/11/23 1143  Vitals shown include unvalidated device data.  Last Pain:  Vitals:   04/11/23 1138  TempSrc:   PainSc: 0-No pain      Patients Stated Pain Goal: 3 (04/11/23 0834)  Complications: No notable events documented.

## 2023-04-11 NOTE — Anesthesia Postprocedure Evaluation (Signed)
Anesthesia Post Note  Patient: Dustin HolleranProcedure(s) Performed: Right middle finger ulnar proximal phalanx mass excision (Right: Finger)     Patient location during evaluation: PACU Anesthesia Type: MAC Level of consciousness: awake and alert Pain management: pain level controlled Vital Signs Assessment: post-procedure vital signs reviewed and stable Respiratory status: spontaneous breathing, nonlabored ventilation, respiratory function stable and patient connected to nasal cannula oxygen Cardiovascular status: stable and blood pressure returned to baseline Postop Assessment: no apparent nausea or vomiting Anesthetic complications: no   No notable events documented.  Last Vitals:  Vitals:   04/11/23 1200 04/11/23 1226  BP: 113/78 125/81  Pulse: (!) 48 (!) 53  Resp: 15 18  Temp:  (!) 36.2 C  SpO2: 99% 95%    Last Pain:  Vitals:   04/11/23 1226  TempSrc: Oral  PainSc: 0-No pain                 Collene Schlichter

## 2023-04-11 NOTE — Anesthesia Preprocedure Evaluation (Signed)
Anesthesia Evaluation  Patient identified by MRN, date of birth, ID band Patient awake    Reviewed: Allergy & Precautions, NPO status , Patient's Chart, lab work & pertinent test results  Airway Mallampati: III  TM Distance: >3 FB Neck ROM: Full    Dental  (+) Dental Advisory Given   Pulmonary neg pulmonary ROS   breath sounds clear to auscultation       Cardiovascular hypertension, Pt. on medications and Pt. on home beta blockers + Peripheral Vascular Disease   Rhythm:Regular Rate:Normal     Neuro/Psych negative neurological ROS     GI/Hepatic Neg liver ROS,GERD  ,,  Endo/Other  Hypothyroidism    Renal/GU negative Renal ROS     Musculoskeletal   Abdominal   Peds  Hematology negative hematology ROS (+)   Anesthesia Other Findings   Reproductive/Obstetrics                             Anesthesia Physical Anesthesia Plan  ASA: 3  Anesthesia Plan: MAC   Post-op Pain Management: Tylenol PO (pre-op)* and Toradol IV (intra-op)*   Induction: Intravenous  PONV Risk Score and Plan: 1 and Propofol infusion, Ondansetron and Treatment may vary due to age or medical condition  Airway Management Planned: Natural Airway and Simple Face Mask  Additional Equipment: None  Intra-op Plan:   Post-operative Plan:   Informed Consent: I have reviewed the patients History and Physical, chart, labs and discussed the procedure including the risks, benefits and alternatives for the proposed anesthesia with the patient or authorized representative who has indicated his/her understanding and acceptance.       Plan Discussed with: CRNA  Anesthesia Plan Comments:        Anesthesia Quick Evaluation

## 2023-04-11 NOTE — Op Note (Signed)
OPERATIVE NOTE  DATE OF PROCEDURE: 04/11/2023  SURGEONS:  Primary: Gomez Cleverly, MD  ASSISTANT: Payton Mccallum, PA-C  Due to the complexity of the surgery an assistant was necessary to aid in retraction, exposure, limb positioning, closure and dressing application. The use of an assistant on this case follows CMS and CPT guidelines, which allows an assistant to be used because of the complexity level of this case.   PREOPERATIVE DIAGNOSIS: right middle finger mass  POSTOPERATIVE DIAGNOSIS: Same  NAME OF PROCEDURE:   Right middle finger deep mass excision  ANESTHESIA: Monitor Anesthesia Care + Local  SKIN PREPARATION: Hibiclens  ESTIMATED BLOOD LOSS: Minimal  IMPLANTS: none  INDICATIONS:  Dustin Wall is a 72 y.o. male who has the above preoperative diagnosis. The patient has decided to proceed with surgical intervention.  Risks, benefits and alternatives of operative management were discussed including, but not limited to, risks of anesthesia complications, infection, pain, persistent symptoms, stiffness, need for future surgery.  The patient understands, agrees and elects to proceed with surgery.    DESCRIPTION OF PROCEDURE: The patient was met in the pre-operative area and their identity was verified.  The operative location and laterality was also verified and marked.  The patient was brought to the OR and was placed supine on the table.  After repeat patient identification with the operative team anesthesia was provided and the patient was prepped and draped in the usual sterile fashion.  A final timeout was performed verifying the correction patient, procedure, location and laterality.  Preoperative antibiotics were provided and then a Esmarch was utilized to exsanguinate the extremity and tourniquet inflated to 250 mmHg.  A longitudinal incision was made over the ulnar border at the mid lateral level of the right middle finger.  Skin subcutaneous tissues were divided and careful  hemostasis was obtained the extensor tendons were mobilized dorsally and the neurovascular bundle was dissected and retracted volarly.  The ganglion cyst was identified emanating from the flexor tendon sheath.  The mass was then excised in its entirety.  A small window of the pulley was excised as well the stalk was cauterized at this level.  Care was taken to protect the flexor tendons.  The wound was thoroughly irrigated and the skin was closed in standard fashion with absorbable sutures.  A sterile soft bandage was applied.  The tourniquet was deflated and the finger was pink and warm well-perfused with brisk capillary refill to the tip of the digit.  All counts were correct x 2.  The patient was awoken from anesthesia and brought to recovery in stable condition.   Philipp Ovens, MD

## 2023-04-12 ENCOUNTER — Encounter (HOSPITAL_BASED_OUTPATIENT_CLINIC_OR_DEPARTMENT_OTHER): Payer: Self-pay | Admitting: Orthopedic Surgery

## 2023-04-12 LAB — SURGICAL PATHOLOGY

## 2023-04-13 DIAGNOSIS — L821 Other seborrheic keratosis: Secondary | ICD-10-CM | POA: Diagnosis not present

## 2023-04-13 DIAGNOSIS — L280 Lichen simplex chronicus: Secondary | ICD-10-CM | POA: Diagnosis not present

## 2023-04-13 DIAGNOSIS — L281 Prurigo nodularis: Secondary | ICD-10-CM | POA: Diagnosis not present

## 2023-04-20 ENCOUNTER — Other Ambulatory Visit: Payer: Self-pay | Admitting: Internal Medicine

## 2023-04-21 ENCOUNTER — Other Ambulatory Visit: Payer: Self-pay | Admitting: Internal Medicine

## 2023-05-26 ENCOUNTER — Ambulatory Visit (INDEPENDENT_AMBULATORY_CARE_PROVIDER_SITE_OTHER): Payer: Medicare HMO | Admitting: Medical

## 2023-05-26 VITALS — BP 116/72 | HR 68 | Resp 18 | Ht 66.0 in | Wt 200.4 lb

## 2023-05-26 DIAGNOSIS — B029 Zoster without complications: Secondary | ICD-10-CM | POA: Diagnosis not present

## 2023-05-26 MED ORDER — FAMCICLOVIR 500 MG PO TABS: 500.0000 mg | ORAL_TABLET | Freq: Three times a day (TID) | ORAL | 0 refills | Status: DC

## 2023-05-26 MED ORDER — MUPIROCIN 2 % EX OINT: 1.0000 | TOPICAL_OINTMENT | Freq: Two times a day (BID) | CUTANEOUS | 0 refills | Status: DC

## 2023-05-26 NOTE — Patient Instructions (Addendum)
Possible early atypical shingles eruption.( Only one lesion presently with soreness and burning).  Rx famvir as this would prevent further out break and avoid neuralgia. Rx topical mupirocin in event early skin infection.  Follow up in 10 days or sooner if needed.

## 2023-05-26 NOTE — Progress Notes (Signed)
Subjective:    Patient ID: Dustin Wall, male    DOB: 16-Sep-1951, 72 y.o.   MRN: [id number]  HPI  Pt has spot on his left side/flank/rib area. He states had this for about 2 weeks. The area itches and is mildly sore/slight burn.  No pain radiating to front or back.  He had small bump in this area. He states bump has been there for about 2 weeks.  Pt states he had shingles in the past in 1990. I don't have records of that.   No reported insect bite.    Review of Systems  Constitutional:  Negative for chills, fatigue and fever.  Respiratory:  Negative for cough, chest tightness, shortness of breath and wheezing.   Cardiovascular:  Negative for chest pain and palpitations.  Gastrointestinal:  Negative for abdominal pain, blood in stool, diarrhea and nausea.  Skin:  Negative for rash.       One small papule vs vesicle left lower rib area. With sore/burning.  Neurological:  Negative for dizziness, numbness and headaches.  Hematological:  Negative for adenopathy. Does not bruise/bleed easily.  Psychiatric/Behavioral:  Negative for behavioral problems and confusion.     Past Medical History:  Diagnosis Date   Ascending aortic aneurysm (HCC)    dx 2010 ~ 4.4 cm, ..4.7cm 11/11/21   GERD (gastroesophageal reflux disease)    Hyperlipidemia    Hypertension    Hypothyroidism    s/p hyperthyroidism, s/p radioiodine     Social History   Socioeconomic History   Marital status: Married    Spouse name: Not on file   Number of children: 0   Years of education: Not on file   Highest education level: Not on file  Occupational History   Occupation: fully retired at age 84, sanitation  Tobacco Use   Smoking status: Never   Smokeless tobacco: Never  Vaping Use   Vaping Use: Never used  Substance and Sexual Activity   Alcohol use: Yes    Comment: beer and wine occasional   Drug use: No   Sexual activity: Yes  Other Topics Concern   Not on file  Social History Narrative    Lives w/ wife   Social Determinants of Health   Financial Resource Strain: Low Risk  (11/08/2021)   Overall Financial Resource Strain (CARDIA)    Difficulty of Paying Living Expenses: Not hard at all  Food Insecurity: No Food Insecurity (11/08/2021)   Hunger Vital Sign    Worried About Running Out of Food in the Last Year: Never true    Ran Out of Food in the Last Year: Never true  Transportation Needs: No Transportation Needs (11/08/2021)   PRAPARE - Administrator, Civil Service (Medical): No    Lack of Transportation (Non-Medical): No  Physical Activity: Sufficiently Active (11/08/2021)   Exercise Vital Sign    Days of Exercise per Week: 5 days    Minutes of Exercise per Session: 60 min  Stress: No Stress Concern Present (11/08/2021)   [employer] of Occupational Health - Occupational Stress Questionnaire    Feeling of Stress : Not at all  Social Connections: Socially Isolated (11/08/2021)   Social Connection and Isolation Panel [NHANES]    Frequency of Communication with Friends and Family: Once a week    Frequency of Social Gatherings with Friends and Family: Once a week    Attends Religious Services: Never    Database administrator or Organizations: No    Attends [employer]  or Organization Meetings: Never    Marital Status: Married  Catering manager Violence: Not At Risk (11/08/2021)   Humiliation, Afraid, Rape, and Kick questionnaire    Fear of Current or Ex-Partner: No    Emotionally Abused: No    Physically Abused: No    Sexually Abused: No    Past Surgical History:  Procedure Laterality Date   COLONOSCOPY     ?2012 in [place]   HEMORRHOID SURGERY     MASS EXCISION Right 04/11/2023   Procedure: Right middle finger ulnar proximal phalanx mass excision;  Surgeon: Gomez Cleverly, MD;  Location: [facility];  Service: Orthopedics;  Laterality: Right;   UPPER GASTROINTESTINAL ENDOSCOPY  12/16/2014    Family History  Problem Relation Age of Onset    Stomach cancer Father    Cancer Father    Prostate cancer Brother    Prostate cancer Brother    Hypertension Other        several fam members    Colon cancer Neg Hx    CAD Neg Hx    Diabetes Neg Hx    Stroke Neg Hx    Rectal cancer Neg Hx    Colon polyps Neg Hx     Allergies  Allergen Reactions   Gadolinium Derivatives Nausea And Vomiting    Pt was given IV Zofran per Dr Fredia Sorrow prior to imaging for n/v   Pt states prev n/v from gad at GI from [facility]    Current Outpatient Medications on File Prior to Visit  Medication Sig Dispense Refill   amLODipine (NORVASC) 10 MG tablet Take 1 tablet (10 mg total) by mouth daily. 90 tablet 1   atorvastatin (LIPITOR) 80 MG tablet TAKE 1 TABLET BY MOUTH EVERYDAY AT BEDTIME 90 tablet 1   augmented betamethasone dipropionate (DIPROLENE-AF) 0.05 % cream Apply topically 2 (two) times daily as needed (for rash). 60 g 1   cetirizine (ZYRTEC) 10 MG tablet Take 1 tablet (10 mg total) by mouth daily.     ezetimibe (ZETIA) 10 MG tablet Take 1 tablet (10 mg total) by mouth daily. 90 tablet 1   levothyroxine (SYNTHROID) 125 MCG tablet Take 1 tablet (125 mcg total) by mouth daily before breakfast. 90 tablet 1   metoprolol succinate (TOPROL-XL) 100 MG 24 hr tablet Take 1 tablet (100 mg total) by mouth daily. TAKE WITH OR IMMEDIATELY FOLLOWING A MEAL. 90 tablet 1   Multiple Vitamin (MULTI VITAMIN MENS PO) Take by mouth.     No current facility-administered medications on file prior to visit.    BP 116/72   Pulse 68   Resp 18   Ht 5\' 6"  (1.676 m)   Wt 200 lb 6.4 oz (90.9 kg)   SpO2 98%   BMI 32.35 kg/m        Objective:   Physical Exam  General Mental Status- Alert. General Appearance- Not in acute distress.    Skin- left lower rib area small vesicle vs papule. No surrounding redness. No dc. No ulcer.   Chest and Lung Exam Auscultation: Breath Sounds:-Normal.  Cardiovascular Auscultation:Rythm- Regular. Murmurs & Other Heart  Sounds:Auscultation of the heart reveals- No Murmurs.   Neurologic Cranial Nerve exam:- CN III-XII intact(No nystagmus), symmetric smile. Strength:- 5/5 equal and symmetric strength both upper and lower extremities.        Assessment & Plan:   Patient Instructions  Possible early atypical shingles eruption.( Only one lesion presently with soreness and burning).  Rx famvir as this would prevent  further out break and avoid neuralgia. Rx topical mupirocin in event early skin infection.  Follow up in 10 days or sooner if needed.     Esperanza Richters, PA-C

## 2023-06-19 ENCOUNTER — Encounter: Payer: Self-pay | Admitting: Internal Medicine

## 2023-06-19 ENCOUNTER — Ambulatory Visit (INDEPENDENT_AMBULATORY_CARE_PROVIDER_SITE_OTHER): Payer: Medicare HMO | Admitting: Internal Medicine

## 2023-06-19 VITALS — BP 124/68 | HR 68 | Temp 97.9°F | Resp 16 | Ht 65.0 in | Wt 198.5 lb

## 2023-06-19 DIAGNOSIS — E785 Hyperlipidemia, unspecified: Secondary | ICD-10-CM | POA: Diagnosis not present

## 2023-06-19 DIAGNOSIS — E039 Hypothyroidism, unspecified: Secondary | ICD-10-CM

## 2023-06-19 DIAGNOSIS — I10 Essential (primary) hypertension: Secondary | ICD-10-CM | POA: Diagnosis not present

## 2023-06-19 LAB — TSH: TSH: 1.2 u[IU]/mL (ref 0.35–5.50)

## 2023-06-19 NOTE — Progress Notes (Signed)
   Subjective:    Patient ID: Dustin Wall, male    DOB: 01/16/1951, 72 y.o.   MRN: [id number]  DOS:  06/19/2023 Type of visit - description: f/u  Since the last office visit is doing well.  Was diagnosed with shingles but he does not think that was the case Currently asymptomatic.  Good compliance with medicines, ambulatory BPs normal.  He reported night sweats at LOV, that is resolved.  Review of Systems See above   Past Medical History:  Diagnosis Date   Ascending aortic aneurysm (HCC)    dx 2010 ~ 4.4 cm, ..4.7cm 11/11/21   GERD (gastroesophageal reflux disease)    Hyperlipidemia    Hypertension    Hypothyroidism    s/p hyperthyroidism, s/p radioiodine    Past Surgical History:  Procedure Laterality Date   COLONOSCOPY     ?2012 in [place]   HEMORRHOID SURGERY     MASS EXCISION Right 04/11/2023   Procedure: Right middle finger ulnar proximal phalanx mass excision;  Surgeon: Gomez Cleverly, MD;  Location: [facility];  Service: Orthopedics;  Laterality: Right;   UPPER GASTROINTESTINAL ENDOSCOPY  12/16/2014    Current Outpatient Medications  Medication Instructions   amLODipine (NORVASC) 10 mg, Oral, Daily   atorvastatin (LIPITOR) 80 MG tablet TAKE 1 TABLET BY MOUTH EVERYDAY AT BEDTIME   augmented betamethasone dipropionate (DIPROLENE-AF) 0.05 % cream Topical, 2 times daily PRN   cetirizine (ZYRTEC) 10 mg, Oral, Daily   ezetimibe (ZETIA) 10 mg, Oral, Daily   famciclovir (FAMVIR) 500 mg, Oral, 3 times daily   levothyroxine (SYNTHROID) 125 mcg, Oral, Daily before breakfast   metoprolol succinate (TOPROL-XL) 100 mg, Oral, Daily, TAKE WITH OR IMMEDIATELY FOLLOWING A MEAL.   Multiple Vitamin (MULTI VITAMIN MENS PO) Oral   mupirocin ointment (BACTROBAN) 2 % 1 Application, Topical, 2 times daily       Objective:   Physical Exam BP 124/68   Pulse 68   Temp 97.9 F (36.6 C) (Oral)   Resp 16   Ht 5\' 5"  (1.651 m)   Wt 198 lb 8 oz (90 kg)   SpO2 99%   BMI  33.03 kg/m  General:   Well developed, NAD, BMI noted. HEENT:  Normocephalic . Face symmetric, atraumatic Lungs:  CTA B Normal respiratory effort, no intercostal retractions, no accessory muscle use. Heart: RRR,  no murmur.  Lower extremities: no pretibial edema bilaterally  Skin: Not pale. Not jaundice Neurologic:  alert & oriented X3.  Speech normal, gait appropriate for age and unassisted Psych--  Cognition and judgment appear intact.  Cooperative with normal attention span and concentration.  Behavior appropriate. No anxious or depressed appearing.      Assessment      Assessment HTN Hyperlipidemia Hypothyroidism (hypothyroidism  > S/P  radioiodine) Ascending aortic aneurysm DX 2010, 4.5 cm. Sees Dr. Dorris Fetch Vitiligo. Acanthosis Nigricans Varicose veins     PLAN: HTN: On amlodipine, metoprolol.  Ambulatory BPs normal.  No change Hyperlipidemia: On atorvastatin, Zetia, well-controlled. Shingles's: Dx 05/26/2023, patient did not think he had shingles, did not take antivirals.  Currently asymptomatic Hypothyroidism: On Synthroid, check TSH Night sweats, see LOV, all labs were normal.  Symptoms resolved. Vaccine advice provided. RTC 6 months routine checkup

## 2023-06-19 NOTE — Patient Instructions (Addendum)
Vaccines I recommend: Covid booster Flu shot this fall RSV vaccine  Continue checking your blood pressure BP GOAL is between 110/65 and  135/85. If it is consistently higher or lower, let me know    GO TO THE LAB : Get the blood work     GO TO THE FRONT DESK, PLEASE SCHEDULE YOUR APPOINTMENTS Come back for a checkup in 6 months

## 2023-06-20 NOTE — Assessment & Plan Note (Signed)
HTN: On amlodipine, metoprolol.  Ambulatory BPs normal.  No change Hyperlipidemia: On atorvastatin, Zetia, well-controlled. Shingles's: Dx 05/26/2023, patient did not think he had shingles, did not take antivirals.  Currently asymptomatic Hypothyroidism: On Synthroid, check TSH Night sweats, see LOV, all labs were normal.  Symptoms resolved. Vaccine advice provided. RTC 6 months routine checkup

## 2023-07-11 ENCOUNTER — Encounter: Payer: Self-pay | Admitting: Internal Medicine

## 2023-07-11 ENCOUNTER — Ambulatory Visit (INDEPENDENT_AMBULATORY_CARE_PROVIDER_SITE_OTHER): Payer: Medicare HMO | Admitting: Internal Medicine

## 2023-07-11 VITALS — BP 122/66 | HR 75 | Temp 97.6°F | Resp 16 | Ht 65.0 in | Wt 197.5 lb

## 2023-07-11 DIAGNOSIS — M25511 Pain in right shoulder: Secondary | ICD-10-CM | POA: Diagnosis not present

## 2023-07-11 NOTE — Patient Instructions (Addendum)
Please bring [place] a copy of your Healthcare Power of Attorney for your chart.   We are referring you to the  orthopedics office.  Okay to continue IcyHot compresses.  Tylenol  500 mg OTC 2 tabs a day every 8 hours as needed for pain  Try to keep the shoulder active moving around slowly

## 2023-07-11 NOTE — Progress Notes (Unsigned)
Subjective:    Patient ID: Dustin Wall, male    DOB: January 21, 1951, 72 y.o.   MRN: [id number]  DOS:  07/11/2023 Type of visit - description: Acute  Has a long history of bilateral shoulder pain, used to see a sports medicine. Problem was silent since 2020 until 3 months ago when he developed R L shoulder pain. Pain is located anteriorly mostly when he elevates his arms above his head.  Denies any fall or injury. No fever or chills. IcyHot patches helped to some extent.  Review of Systems See above   Past Medical History:  Diagnosis Date   Ascending aortic aneurysm (HCC)    dx 2010 ~ 4.4 cm, ..4.7cm 11/11/21   GERD (gastroesophageal reflux disease)    Hyperlipidemia    Hypertension    Hypothyroidism    s/p hyperthyroidism, s/p radioiodine    Past Surgical History:  Procedure Laterality Date   COLONOSCOPY     ?2012 in [place]   HEMORRHOID SURGERY     MASS EXCISION Right 04/11/2023   Procedure: Right middle finger ulnar proximal phalanx mass excision;  Surgeon: Gomez Cleverly, MD;  Location: [facility];  Service: Orthopedics;  Laterality: Right;   UPPER GASTROINTESTINAL ENDOSCOPY  12/16/2014    Current Outpatient Medications  Medication Instructions   amLODipine (NORVASC) 10 mg, Oral, Daily   atorvastatin (LIPITOR) 80 MG tablet TAKE 1 TABLET BY MOUTH EVERYDAY AT BEDTIME   augmented betamethasone dipropionate (DIPROLENE-AF) 0.05 % cream Topical, 2 times daily PRN   cetirizine (ZYRTEC) 10 mg, Oral, Daily   ezetimibe (ZETIA) 10 mg, Oral, Daily   levothyroxine (SYNTHROID) 125 mcg, Oral, Daily before breakfast   metoprolol succinate (TOPROL-XL) 100 mg, Oral, Daily, TAKE WITH OR IMMEDIATELY FOLLOWING A MEAL.   Multiple Vitamin (MULTI VITAMIN MENS PO) Oral       Objective:   Physical Exam BP 122/66   Pulse 75   Temp 97.6 F (36.4 C) (Oral)   Resp 16   Ht 5\' 5"  (1.651 m)   Wt 197 lb 8 oz (89.6 kg)   SpO2 98%   BMI 32.87 kg/m  General:   Well developed,  NAD, BMI noted. HEENT:  Normocephalic . Face symmetric, atraumatic MSK: Question of right shoulder effusion at the A/C joint upon palpation. Otherwise no deformity. Left shoulder: Range of motion essentially normal Right shoulder: Range of motion normal except with arm elevation, ROM limited by pain. Skin: Not pale. Not jaundice Neurologic:  alert & oriented X3.  Speech normal, gait appropriate for age and unassisted.  Strength symmetric throughout Psych--  Cognition and judgment appear intact.  Cooperative with normal attention span and concentration.  Behavior appropriate. No anxious or depressed appearing.      Assessment       Assessment HTN Hyperlipidemia Hypothyroidism (hypothyroidism  > S/P  radioiodine) Ascending aortic aneurysm DX 2010, 4.5 cm. Sees Dr. Dorris Fetch Vitiligo. Acanthosis Nigricans Varicose veins     PLAN: R shoulder pain Saw sports medicine back in 2020, felt to have DJD, got better after home exercises and a right shoulder aspiration/injection. The pain has resurfaced for few months, question of a effusion on exam. Plan: Use Tylenol instead of ibuprofen, IcyHot is okay, do some home exercises to keep the shoulder moving.  Refer to Ortho   HTN: On amlodipine, metoprolol.  Ambulatory BPs normal.  No change Hyperlipidemia: On atorvastatin, Zetia, well-controlled. Shingles's: Dx 05/26/2023, patient did not think he had shingles, did not take antivirals.  Currently asymptomatic Hypothyroidism:  On Synthroid, check TSH Night sweats, see LOV, all labs were normal.  Symptoms resolved. Vaccine advice provided. RTC 6 months routine checkup

## 2023-07-12 NOTE — Assessment & Plan Note (Signed)
R shoulder pain Saw sports medicine back in 2020, felt to have DJD, got better after home exercises and a right shoulder aspiration/injection. The pain has resurfaced for few months, question of a effusion on exam. Plan: Use Tylenol instead of ibuprofen, IcyHot is okay, do some home exercises to keep the shoulder moving.  Refer to Ortho

## 2023-07-24 DIAGNOSIS — M25511 Pain in right shoulder: Secondary | ICD-10-CM | POA: Diagnosis not present

## 2023-07-24 DIAGNOSIS — M4692 Unspecified inflammatory spondylopathy, cervical region: Secondary | ICD-10-CM | POA: Diagnosis not present

## 2023-07-24 DIAGNOSIS — M25512 Pain in left shoulder: Secondary | ICD-10-CM | POA: Diagnosis not present

## 2023-08-07 ENCOUNTER — Telehealth: Payer: Self-pay | Admitting: Internal Medicine

## 2023-08-07 DIAGNOSIS — I10 Essential (primary) hypertension: Secondary | ICD-10-CM

## 2023-08-07 MED ORDER — AMLODIPINE BESYLATE 10 MG PO TABS
10.0000 mg | ORAL_TABLET | Freq: Every day | ORAL | 1 refills | Status: DC
Start: 2023-08-07 — End: 2024-02-12

## 2023-08-07 NOTE — Telephone Encounter (Signed)
Pt came in office stating had requested meds with pharmacy last wk to get refill and pt states has not received rx yet at the pharmacy, pt is completely out of meds and needing it sent ASAP amLODipine (NORVASC) 10 MG tablet [id number] - [place], [place] - [address] [place] [address] Phone: [phone]  Fax: [phone] DEA #: [id number] . Please advise. Pt is completely out of meds.

## 2023-08-07 NOTE — Telephone Encounter (Signed)
Rx sent 

## 2023-08-07 NOTE — Therapy (Signed)
OUTPATIENT PHYSICAL THERAPY CERVICAL & UPPER EXTREMITY EVALUATION   Patient Name: Dustin Wall MRN: [id number] DOB:14-Sep-1951, 72 y.o., male Today's Date: 08/11/2023   END OF SESSION:  PT End of Session - 08/11/23 0847     Visit Number 1    Date for PT Re-Evaluation 10/06/23    Authorization Type [employer] Medicare - VL: [place]    PT Start Time 0847    PT Stop Time 0931    PT Time Calculation (min) 44 min    Activity Tolerance Patient tolerated treatment well    Behavior During Therapy [facility] for tasks assessed/performed             Past Medical History:  Diagnosis Date   Ascending aortic aneurysm (HCC)    dx 2010 ~ 4.4 cm, ..4.7cm 11/11/21   GERD (gastroesophageal reflux disease)    Hyperlipidemia    Hypertension    Hypothyroidism    s/p hyperthyroidism, s/p radioiodine   Past Surgical History:  Procedure Laterality Date   COLONOSCOPY     ?2012 in [place]   HEMORRHOID SURGERY     MASS EXCISION Right 04/11/2023   Procedure: Right middle finger ulnar proximal phalanx mass excision;  Surgeon: Gomez Cleverly, MD;  Location: [facility];  Service: Orthopedics;  Laterality: Right;   UPPER GASTROINTESTINAL ENDOSCOPY  12/16/2014   Patient Active Problem List   Diagnosis Date Noted   Chronic right shoulder pain 09/30/2019   PCP NOTE >>>>>>>>>>>>>>>>>>>>>> 04/19/2016   Vitiligo 04/19/2016   Acanthosis nigricans 04/19/2016   Ascending aortic aneurysm (HCC)    Annual physical exam 03/19/2014   Hyperlipidemia    Hypothyroidism    Hypertension     PCP: Wanda Plump, MD   REFERRING PROVIDER: Jones Broom, MD   REFERRING DIAG:  M25.511 (ICD-10-CM) - Right shoulder pain  M54.12 (ICD-10-CM) - Cervical radiculitis   THERAPY DIAG:  Acute pain of right shoulder  Abnormal posture  Muscle weakness (generalized)  RATIONALE FOR EVALUATION AND TREATMENT: Rehabilitation  ONSET DATE: ~3 months  NEXT MD VISIT: ~09/04/23   SUBJECTIVE:                                                                                                                                                                                                          SUBJECTIVE STATEMENT: Pt reports pain in B shoulders due to OA but for the past several months the R shoulder has been very bad. Pain has been getting better since the injections a couple weeks ago. He denies neck pain, but notes intermittent tingling  down R UE to hand - most often at night depending on the activity during the preceding day  PAIN: Are you having pain? Yes: NPRS scale: 4/10 Pain location: R posterior shoulder Pain description: throbbing Aggravating factors: housework Relieving factors: Nothing  PERTINENT HISTORY:  Chronic R shoulder pain, ascending aortic aneurysm, HTN, hypothyroidism, HLD, GERD  PRECAUTIONS: None  RED FLAGS: None  HAND DOMINANCE: Right and Ambidextrous  WEIGHT BEARING RESTRICTIONS: No  FALLS:  Has patient fallen in last 6 months? No  LIVING ENVIRONMENT: Lives with: lives with their spouse Lives in: House/apartment  OCCUPATION: Retired  PLOF: Independent and Leisure: golf, basketball, relaxing on the patio  PATIENT GOALS: "Make this pain disappear."   OBJECTIVE: (objective measures completed at initial evaluation unless otherwise dated)  DIAGNOSTIC FINDINGS:  No recent imaging available.  03/02/20 - DG Right shoulder: FINDINGS: There is no evidence of fracture or dislocation. There is no evidence of arthropathy or other focal bone abnormality. Soft tissues are unremarkable. IMPRESSION: Negative.  PATIENT SURVEYS:  Quick Dash 11.4 / 100 = 11.4 %  COGNITION: Overall cognitive status: Within functional limits for tasks assessed  SENSATION: Intermittent tingling down R UE to hand - most often at night depending on the activity during the preceding day  POSTURE:  rounded shoulders, forward head, and R shoulder elevated relative to L  PALPATION: Increased muscle  tension in R UT, LS and posterior deltoid but denies TTP   CERVICAL ROM:   Active ROM Eval  Flexion 44  Extension 56  Right lateral flexion 49  Left lateral flexion 52  Right rotation 72  Left rotation 66   (Blank rows = not tested)  UPPER EXTREMITY ROM:  Active ROM Right eval Left eval  Shoulder flexion 126 * 151  Shoulder extension 54 56  Shoulder abduction 120 * 134  Shoulder adduction    Shoulder internal rotation FIR L3 FIR L1  Shoulder external rotation FER  T1 45 FER T4 45   (Blank rows = not tested, * = pain with "clunk" upon lowering arm)  UPPER EXTREMITY MMT:  MMT Right eval Left eval  Shoulder flexion 4 * 4 *  Shoulder extension 4+ 5  Shoulder abduction 4 * 4+ *  Shoulder adduction    Shoulder internal rotation 4+ *  5 *  Shoulder external rotation 4 * 4+ *  Middle trapezius    Lower trapezius     (Blank rows = not tested, * = pain R>L)  SHOULDER SPECIAL TESTS: Impingement tests: Neer impingement test: negative and Hawkins/Kennedy impingement test: positive   Rotator cuff assessment: Empty can test: negative and Full can test: negative   TODAY'S TREATMENT:   08/11/23 - Eval THERAPEUTIC EXERCISE: to improve flexibility, strength and mobility.  Demonstration, verbal and tactile cues throughout for technique.  Seated R UT stretch with slight overpressure 2 x 30"  Seated R LS stretch with light overpressure 2 x 30" Seated cervical retraction 10 x 5" Seated scapular retraction + depression 10 x 5"  Seated B scap retraction shoulder ER 10 x 5"  Seated RTB B scap retraction shoulder ER 10 x 5" - cues to keep elbows tucked at sides with 90 bend   PATIENT EDUCATION:  Education details: PT eval findings, anticipated POC, and initial HEP  Person educated: Patient Education method: Explanation, Demonstration, Verbal cues, and Handouts Education comprehension: verbalized understanding, returned demonstration, verbal cues required, and needs further  education  HOME EXERCISE PROGRAM: Access Code: [email or web address] URL: [email or web address] Date: 08/11/2023  Prepared by: Glenetta Hew  Exercises - Seated Cervical Sidebending Stretch  - 2 x daily - 7 x weekly - 3 reps - 30 sec hold - Seated Levator Scapulae Stretch  - 2 x daily - 7 x weekly - 3 reps - 30 sec hold - Seated Cervical Retraction  - 2 x daily - 7 x weekly - 2 sets - 10 reps - 5 sec hold - Seated Scapular Retraction  - 2 x daily - 7 x weekly - 2 sets - 10 reps - 5 sec hold - Shoulder External Rotation and Scapular Retraction with Resistance  - 2 x daily - 7 x weekly - 2 sets - 10 reps - 3-5 sec hold  ASSESSMENT:  CLINICAL IMPRESSION: Dustin Wall is a 72 y.o. male who was seen today for physical therapy evaluation and treatment for R shoulder pain and cervical radiculitis.  He reports history of B shoulder pain due to OA, however notes R shoulder pain has been worsening over past ~2 months, but better since recent injection.  Grinding sensation present when elevating B shoulders overhead, with painful clunk present as he attempts to lower his arms.  Current deficits included did and painful R>L shoulder ROM postural abnormalities with R shoulder elevated and slightly forward, abnormal muscle tension in R shoulder complex although denies TTP, and mild R>L shoulder and scapular weakness with pain upon resisted MMT for most motions.  He denies neck pain, although does note occasional intermittent tingling down RUE, most common at night after increased activity during the day.  Cervical ROM WNL with only mild tightness noted in R lateral neck and upper shoulder.  Torao will benefit from skilled PT to address above deficits to improve mobility and activity tolerance with decreased pain interference.   OBJECTIVE IMPAIRMENTS: decreased activity tolerance, decreased knowledge of condition, decreased ROM, decreased strength, increased fascial restrictions, impaired perceived functional  ability, increased muscle spasms, impaired flexibility, impaired UE functional use, improper body mechanics, postural dysfunction, and pain.   ACTIVITY LIMITATIONS: carrying, lifting, and reach over head  PARTICIPATION LIMITATIONS: cleaning, laundry, shopping, community activity, and yard work  PERSONAL FACTORS: Age, Past/current experiences, Time since onset of injury/illness/exacerbation, and 3+ comorbidities: Chronic R shoulder pain, ascending aortic aneurysm, HTN, hypothyroidism, HLD, GERD  are also affecting patient's functional outcome.   REHAB POTENTIAL: Good  CLINICAL DECISION MAKING: Stable/uncomplicated  EVALUATION COMPLEXITY: Low   GOALS: Goals reviewed with patient? Yes  SHORT TERM GOALS: Target date: 09/08/2023  Patient will be independent with initial HEP to improve outcomes and carryover.  Baseline: Initial HEP provided on eval Goal status: INITIAL  LONG TERM GOALS: Target date: 10/06/2023  Patient will be independent with ongoing/advanced HEP for self-management at home.  Baseline:  Goal status: INITIAL  2.  Patient will demonstrate improved posture with posterior shoulder girdle engaged to promote improved glenohumeral joint mobility and decrease muscle imbalance. Baseline: R shoulder elevated and mildly forward Goal status: INITIAL  3.  Patient will report 50-75% improvement in R shoulder pain to improve QOL.  Baseline: 4/10 Goal status: INITIAL  4.  Patient to improve R shoulder AROM to [facility] without pain provocation to allow for increased ease of ADLs.  Baseline: Refer to above UE ROM table Goal status: INITIAL   5.  Patient will demonstrate improved R shoulder strength to >/= 4+/5 for functional UE use. Baseline: Refer to above UE MMT table Goal status: INITIAL   6.  Patient will report </=5% on QuickDASH to demonstrate improved functional  ability.  Baseline: 11.4 / 100 = 11.4 % Goal status: INITIAL   7.  Patient will >/= 50% reduction occurrence of  R UE tingling at night. Baseline:  Goal status: INITIAL    PLAN:  PT FREQUENCY: 1-2x/week  PT DURATION: 8 weeks  PLANNED INTERVENTIONS: Therapeutic exercises, Therapeutic activity, Neuromuscular re-education, Patient/Family education, Self Care, Joint mobilization, Dry Needling, Electrical stimulation, Cryotherapy, Moist heat, Taping, Ultrasound, Ionotophoresis 4mg /ml Dexamethasone, Manual therapy, and Re-evaluation  PLAN FOR NEXT SESSION: Review initial HEP; progress postural/posterior shoulder strengthening with regular HEP updates secondary to 1x/wk POC; MT incorporating joint mobs and STM +/- DN to improve R shoulder glenohumeral kinematics    Marry Guan, PT 08/11/2023, 12:02 PM

## 2023-08-11 ENCOUNTER — Ambulatory Visit: Payer: Medicare HMO | Attending: Orthopedic Surgery | Admitting: Physical Therapy

## 2023-08-11 ENCOUNTER — Encounter: Payer: Self-pay | Admitting: Physical Therapy

## 2023-08-11 ENCOUNTER — Other Ambulatory Visit: Payer: Self-pay

## 2023-08-11 DIAGNOSIS — M25511 Pain in right shoulder: Secondary | ICD-10-CM | POA: Diagnosis not present

## 2023-08-11 DIAGNOSIS — M6281 Muscle weakness (generalized): Secondary | ICD-10-CM

## 2023-08-11 DIAGNOSIS — R293 Abnormal posture: Secondary | ICD-10-CM | POA: Diagnosis not present

## 2023-08-14 ENCOUNTER — Ambulatory Visit: Payer: Medicare HMO

## 2023-08-14 DIAGNOSIS — R293 Abnormal posture: Secondary | ICD-10-CM | POA: Diagnosis not present

## 2023-08-14 DIAGNOSIS — M6281 Muscle weakness (generalized): Secondary | ICD-10-CM

## 2023-08-14 DIAGNOSIS — M25511 Pain in right shoulder: Secondary | ICD-10-CM

## 2023-08-14 NOTE — Therapy (Signed)
OUTPATIENT PHYSICAL THERAPY TREATMENT   Patient Name: Dustin Wall MRN: [id number] DOB:1951-01-09, 72 y.o., male Today's Date: 08/14/2023   END OF SESSION:  PT End of Session - 08/14/23 1608     Visit Number 2    Date for PT Re-Evaluation 10/06/23    Authorization Type [employer] - VL: [place]    PT Start Time 1608    PT Stop Time 1656    PT Time Calculation (min) 48 min    Activity Tolerance Patient tolerated treatment well    Behavior During Therapy [facility] for tasks assessed/performed              Past Medical History:  Diagnosis Date   Ascending aortic aneurysm (HCC)    dx 2010 ~ 4.4 cm, ..4.7cm 11/11/21   GERD (gastroesophageal reflux disease)    Hyperlipidemia    Hypertension    Hypothyroidism    s/p hyperthyroidism, s/p radioiodine   Past Surgical History:  Procedure Laterality Date   COLONOSCOPY     ?2012 in [place]   HEMORRHOID SURGERY     MASS EXCISION Right 04/11/2023   Procedure: Right middle finger ulnar proximal phalanx mass excision;  Surgeon: Gomez Cleverly, MD;  Location: [facility];  Service: Orthopedics;  Laterality: Right;   UPPER GASTROINTESTINAL ENDOSCOPY  12/16/2014   Patient Active Problem List   Diagnosis Date Noted   Chronic right shoulder pain 09/30/2019   PCP NOTE >>>>>>>>>>>>>>>>>>>>>> 04/19/2016   Vitiligo 04/19/2016   Acanthosis nigricans 04/19/2016   Ascending aortic aneurysm (HCC)    Annual physical exam 03/19/2014   Hyperlipidemia    Hypothyroidism    Hypertension     PCP: Wanda Plump, MD   REFERRING PROVIDER: Jones Broom, MD   REFERRING DIAG:  M25.511 (ICD-10-CM) - Right shoulder pain  M54.12 (ICD-10-CM) - Cervical radiculitis   THERAPY DIAG:  Acute pain of right shoulder  Abnormal posture  Muscle weakness (generalized)  RATIONALE FOR EVALUATION AND TREATMENT: Rehabilitation  ONSET DATE: ~3 months  NEXT MD VISIT: ~09/04/23   SUBJECTIVE:                                                                                                                                                                                                          SUBJECTIVE STATEMENT: Pt reports having some pain in R shoulder. Has good days and bad days.  PAIN: Are you having pain? Yes: NPRS scale: 5/10 Pain location: R posterior shoulder Pain description: throbbing Aggravating factors: housework Relieving factors: Nothing  PERTINENT HISTORY:  Chronic R shoulder  pain, ascending aortic aneurysm, HTN, hypothyroidism, HLD, GERD  PRECAUTIONS: None  RED FLAGS: None  HAND DOMINANCE: Right and Ambidextrous  WEIGHT BEARING RESTRICTIONS: No  FALLS:  Has patient fallen in last 6 months? No  LIVING ENVIRONMENT: Lives with: lives with their spouse Lives in: House/apartment  OCCUPATION: Retired  PLOF: Independent and Leisure: golf, basketball, relaxing on the patio  PATIENT GOALS: "Make this pain disappear."   OBJECTIVE: (objective measures completed at initial evaluation unless otherwise dated)  DIAGNOSTIC FINDINGS:  No recent imaging available.  03/02/20 - DG Right shoulder: FINDINGS: There is no evidence of fracture or dislocation. There is no evidence of arthropathy or other focal bone abnormality. Soft tissues are unremarkable. IMPRESSION: Negative.  PATIENT SURVEYS:  Quick Dash 11.4 / 100 = 11.4 %  COGNITION: Overall cognitive status: Within functional limits for tasks assessed  SENSATION: Intermittent tingling down R UE to hand - most often at night depending on the activity during the preceding day  POSTURE:  rounded shoulders, forward head, and R shoulder elevated relative to L  PALPATION: Increased muscle tension in R UT, LS and posterior deltoid but denies TTP   CERVICAL ROM:   Active ROM Eval  Flexion 44  Extension 56  Right lateral flexion 49  Left lateral flexion 52  Right rotation 72  Left rotation 66   (Blank rows = not tested)  UPPER EXTREMITY ROM:  Active ROM  Right eval Left eval  Shoulder flexion 126 * 151  Shoulder extension 54 56  Shoulder abduction 120 * 134  Shoulder adduction    Shoulder internal rotation FIR L3 FIR L1  Shoulder external rotation FER  T1 45 FER T4 45   (Blank rows = not tested, * = pain with "clunk" upon lowering arm)  UPPER EXTREMITY MMT:  MMT Right eval Left eval  Shoulder flexion 4 * 4 *  Shoulder extension 4+ 5  Shoulder abduction 4 * 4+ *  Shoulder adduction    Shoulder internal rotation 4+ *  5 *  Shoulder external rotation 4 * 4+ *  Middle trapezius    Lower trapezius     (Blank rows = not tested, * = pain R>L)  SHOULDER SPECIAL TESTS: Impingement tests: Neer impingement test: negative and Hawkins/Kennedy impingement test: positive   Rotator cuff assessment: Empty can test: negative and Full can test: negative   TODAY'S TREATMENT:  08/14/23 THERAPEUTIC EXERCISE: to improve flexibility, strength and mobility.  Demonstration, verbal and tactile cues throughout for technique.  UBE L1.0 x 6 min Reviewed HEP  Standing B ER RTB back to wall 2x10 Standing B horizontal abd RTB back to wall 2x10 Wall wash into flexion and scaption x 10 bil S/L R shoulder abduction x 10  S/L R shoulder flexion- stopped d/t pain Standing scap retraction and ext RTB 2x10  08/11/23 - Eval THERAPEUTIC EXERCISE: to improve flexibility, strength and mobility.  Demonstration, verbal and tactile cues throughout for technique.  Seated R UT stretch with slight overpressure 2 x 30"  Seated R LS stretch with light overpressure 2 x 30" Seated cervical retraction 10 x 5" Seated scapular retraction + depression 10 x 5"  Seated B scap retraction shoulder ER 10 x 5"  Seated RTB B scap retraction shoulder ER 10 x 5" - cues to keep elbows tucked at sides with 90 bend   PATIENT EDUCATION:  Education details: HEP update  Person educated: Patient Education method: Explanation, Demonstration, Verbal cues, and Handouts Education  comprehension: verbalized understanding, returned  demonstration, verbal cues required, and needs further education  HOME EXERCISE PROGRAM: Access Code: [email or web address] URL: [email or web address] Date: 08/14/2023 Prepared by: Verta Ellen  Exercises - Seated Cervical Sidebending Stretch  - 2 x daily - 7 x weekly - 3 reps - 30 sec hold - Seated Levator Scapulae Stretch  - 2 x daily - 7 x weekly - 3 reps - 30 sec hold - Seated Cervical Retraction  - 2 x daily - 7 x weekly - 2 sets - 10 reps - 5 sec hold - Seated Scapular Retraction  - 2 x daily - 7 x weekly - 2 sets - 10 reps - 5 sec hold - Shoulder External Rotation and Scapular Retraction with Resistance  - 2 x daily - 7 x weekly - 2 sets - 10 reps - 3-5 sec hold - Shoulder Extension with Resistance  - 1 x daily - 7 x weekly - 2 sets - 10 reps - Standing Bilateral Low Shoulder Row with Anchored Resistance  - 1 x daily - 7 x weekly - 2 sets - 10 reps - Shoulder Flexion Wall Slide with Towel  - 1 x daily - 7 x weekly - 2 sets - 10 reps - Standing Shoulder Abduction Slides at Wall  - 1 x daily - 7 x weekly - 2 sets - 10 repsAccess Code:   ASSESSMENT:  CLINICAL IMPRESSION:  Progressed patient through exercises to improve bil shoulder ROM and periscapular strength. Needed a lot of cues with HEP review. He has a lot of crepitus in his R shoulder with overhead reaching, cues given throughout the session to keep his scapula engaged. The horizontal ABD exercise was a bit strenuous. Overall the exercise improved his shoulder pain, as he noted he "felt like he could shoot some basketball" after our session. Amie will benefit from skilled PT to address above deficits to improve mobility and activity tolerance with decreased pain interference.   OBJECTIVE IMPAIRMENTS: decreased activity tolerance, decreased knowledge of condition, decreased ROM, decreased strength, increased fascial restrictions, impaired perceived functional ability, increased  muscle spasms, impaired flexibility, impaired UE functional use, improper body mechanics, postural dysfunction, and pain.   ACTIVITY LIMITATIONS: carrying, lifting, and reach over head  PARTICIPATION LIMITATIONS: cleaning, laundry, shopping, community activity, and yard work  PERSONAL FACTORS: Age, Past/current experiences, Time since onset of injury/illness/exacerbation, and 3+ comorbidities: Chronic R shoulder pain, ascending aortic aneurysm, HTN, hypothyroidism, HLD, GERD  are also affecting patient's functional outcome.   REHAB POTENTIAL: Good  CLINICAL DECISION MAKING: Stable/uncomplicated  EVALUATION COMPLEXITY: Low   GOALS: Goals reviewed with patient? Yes  SHORT TERM GOALS: Target date: 09/08/2023  Patient will be independent with initial HEP to improve outcomes and carryover.  Baseline: Initial HEP provided on eval Goal status: IN PROGRESS  LONG TERM GOALS: Target date: 10/06/2023  Patient will be independent with ongoing/advanced HEP for self-management at home.  Baseline:  Goal status: IN PROGRESS  2.  Patient will demonstrate improved posture with posterior shoulder girdle engaged to promote improved glenohumeral joint mobility and decrease muscle imbalance. Baseline: R shoulder elevated and mildly forward Goal status: IN PROGRESS  3.  Patient will report 50-75% improvement in R shoulder pain to improve QOL.  Baseline: 4/10 Goal status: IN PROGRESS  4.  Patient to improve R shoulder AROM to [facility] without pain provocation to allow for increased ease of ADLs.  Baseline: Refer to above UE ROM table Goal status: IN PROGRESS   5.  Patient will demonstrate improved R shoulder strength  to >/= 4+/5 for functional UE use. Baseline: Refer to above UE MMT table Goal status: IN PROGRESS   6.  Patient will report </=5% on QuickDASH to demonstrate improved functional ability.  Baseline: 11.4 / 100 = 11.4 % Goal status: IN PROGRESS   7.  Patient will >/= 50% reduction  occurrence of R UE tingling at night. Baseline:  Goal status: IN PROGRESS    PLAN:  PT FREQUENCY: 1-2x/week  PT DURATION: 8 weeks  PLANNED INTERVENTIONS: Therapeutic exercises, Therapeutic activity, Neuromuscular re-education, Patient/Family education, Self Care, Joint mobilization, Dry Needling, Electrical stimulation, Cryotherapy, Moist heat, Taping, Ultrasound, Ionotophoresis 4mg /ml Dexamethasone, Manual therapy, and Re-evaluation  PLAN FOR NEXT SESSION: progress postural/posterior shoulder strengthening with regular HEP updates secondary to 1x/wk POC; MT incorporating joint mobs and STM +/- DN to improve R shoulder glenohumeral kinematics    Cahlil Sattar L Kaliope Quinonez, PTA 08/14/2023, 5:00 PM

## 2023-08-23 ENCOUNTER — Ambulatory Visit: Payer: Medicare HMO

## 2023-09-01 ENCOUNTER — Encounter: Payer: Self-pay | Admitting: Physical Therapy

## 2023-09-01 ENCOUNTER — Ambulatory Visit: Payer: Medicare HMO | Attending: Orthopedic Surgery | Admitting: Physical Therapy

## 2023-09-01 DIAGNOSIS — M25511 Pain in right shoulder: Secondary | ICD-10-CM | POA: Insufficient documentation

## 2023-09-01 DIAGNOSIS — M6281 Muscle weakness (generalized): Secondary | ICD-10-CM | POA: Insufficient documentation

## 2023-09-01 DIAGNOSIS — R293 Abnormal posture: Secondary | ICD-10-CM | POA: Diagnosis not present

## 2023-09-01 NOTE — Therapy (Addendum)
OUTPATIENT PHYSICAL THERAPY TREATMENT / DISCHARGE SUMMARY  Progress Note  Reporting Period 08/11/2023 to 09/01/2023   See note below for Objective Data and Assessment of Progress/Goals.   Patient Name: Dustin Wall MRN: [id number] DOB:[birth year], 72 y.o., male Today's Date: 09/01/2023    END OF SESSION:  PT End of Session - 09/01/23 1107     Visit Number 3    Date for PT Re-Evaluation 10/06/23    Authorization Type [employer] Medicare - VL: [place]    PT Start Time 1107    PT Stop Time 1155    PT Time Calculation (min) 48 min    Activity Tolerance Patient tolerated treatment well    Behavior During Therapy [facility] for tasks assessed/performed               Past Medical History:  Diagnosis Date   Ascending aortic aneurysm (HCC)    dx 2010 ~ 4.4 cm, ..4.7cm 11/11/21   GERD (gastroesophageal reflux disease)    Hyperlipidemia    Hypertension    Hypothyroidism    s/p hyperthyroidism, s/p radioiodine   Past Surgical History:  Procedure Laterality Date   COLONOSCOPY     ?2012 in [place]   HEMORRHOID SURGERY     MASS EXCISION Right 04/11/2023   Procedure: Right middle finger ulnar proximal phalanx mass excision;  Surgeon: Gomez Cleverly, MD;  Location: [facility];  Service: Orthopedics;  Laterality: Right;   UPPER GASTROINTESTINAL ENDOSCOPY  12/16/2014   Patient Active Problem List   Diagnosis Date Noted   Chronic right shoulder pain 09/30/2019   PCP NOTE >>>>>>>>>>>>>>>>>>>>>> 04/19/2016   Vitiligo 04/19/2016   Acanthosis nigricans 04/19/2016   Ascending aortic aneurysm (HCC)    Annual physical exam 03/19/2014   Hyperlipidemia    Hypothyroidism    Hypertension     PCP: Wanda Plump, MD   REFERRING PROVIDER: Jones Broom, MD   REFERRING DIAG:  M25.511 (ICD-10-CM) - Right shoulder pain  M54.12 (ICD-10-CM) - Cervical radiculitis   THERAPY DIAG:  Acute pain of right shoulder  Abnormal posture  Muscle weakness (generalized)  RATIONALE FOR EVALUATION AND  TREATMENT: Rehabilitation  ONSET DATE: ~3 months  NEXT MD VISIT: 09/06/23   SUBJECTIVE:                                                                                                                                                                                                         SUBJECTIVE STATEMENT: Pt reports he has been doing the exercises at home and they seem to be helping.  PAIN: Are  you having pain? Yes: NPRS scale:  5/10 Pain location: R posterior upper shoulder Pain description: throbbing Aggravating factors: housework Relieving factors: Nothing  PERTINENT HISTORY:  Chronic R shoulder pain, ascending aortic aneurysm, HTN, hypothyroidism, HLD, GERD  PRECAUTIONS: None  RED FLAGS: None  HAND DOMINANCE: Right and Ambidextrous  WEIGHT BEARING RESTRICTIONS: No  FALLS:  Has patient fallen in last 6 months? No  LIVING ENVIRONMENT: Lives with: lives with their spouse Lives in: House/apartment  OCCUPATION: Retired  PLOF: Independent and Leisure: golf, basketball, relaxing on the patio  PATIENT GOALS: "Make this pain disappear."   OBJECTIVE: (objective measures completed at initial evaluation unless otherwise dated)  DIAGNOSTIC FINDINGS:  No recent imaging available.  03/02/20 - DG Right shoulder: FINDINGS: There is no evidence of fracture or dislocation. There is no evidence of arthropathy or other focal bone abnormality. Soft tissues are unremarkable. IMPRESSION: Negative.  PATIENT SURVEYS:  Quick Dash 11.4 / 100 = 11.4 % ; 09/01/23 - 4.5 / 100 = 4.5 %  COGNITION: Overall cognitive status: Within functional limits for tasks assessed  SENSATION: Intermittent tingling down R UE to hand - most often at night depending on the activity during the preceding day  POSTURE:  rounded shoulders, forward head, and R shoulder elevated relative to L  PALPATION: Increased muscle tension in R UT, LS and posterior deltoid but denies TTP   CERVICAL ROM:    Active ROM Eval  Flexion 44  Extension 56  Right lateral flexion 49  Left lateral flexion 52  Right rotation 72  Left rotation 66   (Blank rows = not tested)  UPPER EXTREMITY ROM:  Active ROM Right eval Left eval R 09/01/23 L 09/01/23  Shoulder flexion 126 * 151 144 152  Shoulder extension 54 56 54 58  Shoulder abduction 120 * 134 151 149  Shoulder adduction      Shoulder internal rotation FIR L3 FIR L1 FIR T11 FIR L1  Shoulder external rotation FER  T1 45 FER T4 45 FER T3 68 FER T4 74   (Blank rows = not tested, * = pain with "clunk" upon lowering arm)  UPPER EXTREMITY MMT:  MMT Right eval Left eval R 09/01/23 L9/6/24  Shoulder flexion 4 * 4 * 4+ * 4+ *  Shoulder extension 4+ 5 5 5   Shoulder abduction 4 * 4+ * 4+ * 4+  Shoulder adduction      Shoulder internal rotation 4+ *  5 * 4+ 5  Shoulder external rotation 4 * 4+ * 4+ 4+  Middle trapezius      Lower trapezius       (Blank rows = not tested, * = pain R>L)  SHOULDER SPECIAL TESTS: Impingement tests: Neer impingement test: negative and Hawkins/Kennedy impingement test: positive   Rotator cuff assessment: Empty can test: negative and Full can test: negative   TODAY'S TREATMENT:   09/01/23 THERAPEUTIC EXERCISE: to improve flexibility, strength and mobility.  Demonstration, verbal and tactile cues throughout for technique.  UBE L1.0 x 6 min (3' each fwd & back) Standing GTB scap retraction + shoulder row 10 x 5" Standing GTB scap retraction + shoulder extension 10 x 5" Standing GTB scap retraction + shoulder ER 10 x 5"  THERAPEUTIC ACTIVITIES: QuickDASH: 4.5 / 100 = 4.5 % UE ROM & MMT assessment Goal assessment  MANUAL THERAPY: To promote normalized muscle tension, improved flexibility, improved joint mobility, increased ROM, and reduced pain. Skilled palpation and monitoring of soft tissue during DN Trigger Point Dry-Needling  Treatment instructions: Expect mild to moderate muscle soreness. S/S of pneumothorax if  dry needled over a lung field, and to seek immediate medical attention should they occur. Patient verbalized understanding of these instructions and education. Patient Consent Given: Yes Education handout provided: Yes Muscles treated: R anterolateral deltoid, teres group and lats Electrical stimulation performed: No Parameters: N/A Treatment response/outcome: Twitch Response Elicited and Palpable Increase in Muscle Length STM/DTM, manual TPR and pin & stretch to muscles addressed with DN   08/14/23 THERAPEUTIC EXERCISE: to improve flexibility, strength and mobility.  Demonstration, verbal and tactile cues throughout for technique.  UBE L1.0 x 6 min Reviewed HEP  Standing B ER RTB back to wall 2x10 Standing B horizontal abd RTB back to wall 2x10 Wall wash into flexion and scaption x 10 bil S/L R shoulder abduction x 10  S/L R shoulder flexion- stopped d/t pain Standing scap retraction and ext RTB 2x10   08/11/23 - Eval THERAPEUTIC EXERCISE: to improve flexibility, strength and mobility.  Demonstration, verbal and tactile cues throughout for technique.  Seated R UT stretch with slight overpressure 2 x 30"  Seated R LS stretch with light overpressure 2 x 30" Seated cervical retraction 10 x 5" Seated scapular retraction + depression 10 x 5"  Seated B scap retraction shoulder ER 10 x 5"  Seated RTB B scap retraction shoulder ER 10 x 5" - cues to keep elbows tucked at sides with 90 bend   PATIENT EDUCATION:  Education details: HEP review, HEP progression, role of DN, and DN rational, procedure, outcomes, potential side effects, and recommended post-treatment exercises/activity  Person educated: Patient Education method: Explanation, Demonstration, and Handouts Education comprehension: verbalized understanding and returned demonstration  HOME EXERCISE PROGRAM: Access Code: [email or web address] URL: [email or web address] Date: 08/14/2023 Prepared by: Verta Ellen  Exercises -  Seated Cervical Sidebending Stretch  - 2 x daily - 7 x weekly - 3 reps - 30 sec hold - Seated Levator Scapulae Stretch  - 2 x daily - 7 x weekly - 3 reps - 30 sec hold - Seated Cervical Retraction  - 2 x daily - 7 x weekly - 2 sets - 10 reps - 5 sec hold - Seated Scapular Retraction  - 2 x daily - 7 x weekly - 2 sets - 10 reps - 5 sec hold - Shoulder External Rotation and Scapular Retraction with Resistance  - 2 x daily - 7 x weekly - 2 sets - 10 reps - 3-5 sec hold - Shoulder Extension with Resistance  - 1 x daily - 7 x weekly - 2 sets - 10 reps - Standing Bilateral Low Shoulder Row with Anchored Resistance  - 1 x daily - 7 x weekly - 2 sets - 10 reps - Shoulder Flexion Wall Slide with Towel  - 1 x daily - 7 x weekly - 2 sets - 10 reps - Standing Shoulder Abduction Slides at Wall  - 1 x daily - 7 x weekly - 2 sets - 10 repsAccess Code:   ASSESSMENT:  CLINICAL IMPRESSION:  Olga reports his HEP going well and he feels like it is helping. He is demonstrating improved B shoulder ROM (now essentially WFL/WNL) with decreased pain as well as gains in overall B shoulder strength with decreased pain upon resisted motions. He still demonstrated some taut, tender bands in his R anterolateral deltoids, lats and teres group which appeared amenable to DN. After explanation of DN rational, procedures, outcomes and potential side effects, patient verbalized consent to DN treatment  in conjunction with manual STM/DTM and TPR to reduce ttp/muscle tension. Muscles treated as indicated above. DN produced normal response with good twitches elicited resulting in palpable reduction in pain/ttp and muscle tension with pt noting decreased grinding with shoulder movement following DN. Pt educated to expect mild to moderate muscle soreness for up to 24-48 hrs and instructed to continue prescribed HEP and current activity level with pt verbalizing understanding of these instructions. Session concluded with brief review and  progression of HEP resistance to GTB to promote further strengthening and normalization of muscle activity. Jamorie is demonstrating good progress with his PT goals and feel like he would like to try transitioning to his HEP, but would like to remain on hold for 30-days in the event that issues arise that would necessitate a return to PT.   OBJECTIVE IMPAIRMENTS: decreased activity tolerance, decreased knowledge of condition, decreased ROM, decreased strength, increased fascial restrictions, impaired perceived functional ability, increased muscle spasms, impaired flexibility, impaired UE functional use, improper body mechanics, postural dysfunction, and pain.   ACTIVITY LIMITATIONS: carrying, lifting, and reach over head  PARTICIPATION LIMITATIONS: cleaning, laundry, shopping, community activity, and yard work  PERSONAL FACTORS: Age, Past/current experiences, Time since onset of injury/illness/exacerbation, and 3+ comorbidities: Chronic R shoulder pain, ascending aortic aneurysm, HTN, hypothyroidism, HLD, GERD  are also affecting patient's functional outcome.   REHAB POTENTIAL: Good  CLINICAL DECISION MAKING: Stable/uncomplicated  EVALUATION COMPLEXITY: Low   GOALS: Goals reviewed with patient? Yes  SHORT TERM GOALS: Target date: 09/08/2023  Patient will be independent with initial HEP to improve outcomes and carryover.  Baseline: Initial HEP provided on eval Goal status: MET  09/01/23  LONG TERM GOALS: Target date: 10/06/2023  Patient will be independent with ongoing/advanced HEP for self-management at home.  Baseline:  Goal status: MET  09/01/23  2.  Patient will demonstrate improved posture with posterior shoulder girdle engaged to promote improved glenohumeral joint mobility and decrease muscle imbalance. Baseline: R shoulder elevated and mildly forward Goal status: NOT MET  09/01/23 - still with slight tendency for R should shrug with OH elevation  3.  Patient will report 50-75%  improvement in R shoulder pain to improve QOL.  Baseline: 4/10 Goal status: NOT MET  09/01/23 - 10% improvement  4.  Patient to improve R shoulder AROM to [facility] without pain provocation to allow for increased ease of ADLs.  Baseline: Refer to above UE ROM table Goal status: PARTIALLY MET  09/01/23 - AROM WFL but still some pain at end range  5.  Patient will demonstrate improved R shoulder strength to >/= 4+/5 for functional UE use. Baseline: Refer to above UE MMT table Goal status: PARTIALLY MET  09/01/23 - strength grossly >/= 4+/5 but still pain on some resisted motions  6.  Patient will report </=5% on QuickDASH to demonstrate improved functional ability.  Baseline: 11.4 / 100 = 11.4 % Goal status: MET  09/01/23 - 4.5 / 100 = 4.5 %  7.  Patient will >/= 50% reduction occurrence of R UE tingling at night. Baseline:  Goal status: MET  09/01/23 - pt denies any numbness or tingling at night   PLAN:  PT FREQUENCY: 1-2x/week  PT DURATION: 8 weeks  PLANNED INTERVENTIONS: Therapeutic exercises, Therapeutic activity, Neuromuscular re-education, Patient/Family education, Self Care, Joint mobilization, Dry Needling, Electrical stimulation, Cryotherapy, Moist heat, Taping, Ultrasound, Ionotophoresis 4mg /ml Dexamethasone, Manual therapy, and Re-evaluation  PLAN FOR NEXT SESSION: Transition to HEP + 30-day hold.  If he would need to return  to PT: assess response to DN; progress postural/posterior shoulder strengthening with regular HEP updates secondary to 1x/wk POC; MT incorporating joint mobs and STM +/- DN to improve R shoulder glenohumeral kinematics    Marry Guan, PT 09/01/2023, 12:17 PM   PHYSICAL THERAPY DISCHARGE SUMMARY  Visits from Start of Care: 3  Current functional level related to goals / functional outcomes: Refer to above clinical impression and goal assessment for status as of last visit on 09/01/2023. Patient was placed on hold for 30 days and has not needed to return to PT,  therefore will proceed with discharge from PT for this episode.     Remaining deficits: As above.   Education / Equipment: HEP  Patient agrees to discharge. Patient goals were partially met. Patient is being discharged due to being pleased with the current functional level.  Marry Guan, PT 10/26/23, 10:26 AM  [facility] [address]  [address] [place], [place], [address] Phone: [phone]   Fax:  [phone]

## 2023-09-06 DIAGNOSIS — M25512 Pain in left shoulder: Secondary | ICD-10-CM | POA: Diagnosis not present

## 2023-09-06 DIAGNOSIS — M25511 Pain in right shoulder: Secondary | ICD-10-CM | POA: Diagnosis not present

## 2023-09-08 ENCOUNTER — Encounter: Payer: Medicare HMO | Admitting: Physical Therapy

## 2023-10-05 ENCOUNTER — Other Ambulatory Visit: Payer: Self-pay | Admitting: Thoracic Surgery (Cardiothoracic Vascular Surgery)

## 2023-10-05 DIAGNOSIS — I7121 Aneurysm of the ascending aorta, without rupture: Secondary | ICD-10-CM

## 2023-10-11 ENCOUNTER — Encounter: Payer: Self-pay | Admitting: Thoracic Surgery (Cardiothoracic Vascular Surgery)

## 2023-10-20 ENCOUNTER — Other Ambulatory Visit: Payer: Self-pay | Admitting: Internal Medicine

## 2023-10-20 DIAGNOSIS — M25512 Pain in left shoulder: Secondary | ICD-10-CM | POA: Diagnosis not present

## 2023-10-20 DIAGNOSIS — M25511 Pain in right shoulder: Secondary | ICD-10-CM | POA: Diagnosis not present

## 2023-10-22 ENCOUNTER — Other Ambulatory Visit: Payer: Self-pay | Admitting: Internal Medicine

## 2023-11-13 ENCOUNTER — Telehealth: Payer: Self-pay | Admitting: Internal Medicine

## 2023-11-13 NOTE — Telephone Encounter (Signed)
Copied from CRM [id number]. Topic: Medicare AWV >> Nov 13, 2023 10:31 AM Payton Doughty wrote: Reason for CRM: Called LVM 11/13/2023 to schedule Annual Wellness Visit  Verlee Rossetti; Care Guide Ambulatory Clinical Support [facility] Direct Dial: [phone]

## 2023-11-22 ENCOUNTER — Ambulatory Visit
Admission: RE | Admit: 2023-11-22 | Discharge: 2023-11-22 | Disposition: A | Discharge status: Home / Self Care | Payer: Medicare HMO | Source: Ambulatory Visit | Attending: Thoracic Surgery (Cardiothoracic Vascular Surgery) | Admitting: Thoracic Surgery (Cardiothoracic Vascular Surgery)

## 2023-11-22 DIAGNOSIS — I7121 Aneurysm of the ascending aorta, without rupture: Secondary | ICD-10-CM

## 2023-11-28 ENCOUNTER — Encounter: Payer: Self-pay | Admitting: Thoracic Surgery (Cardiothoracic Vascular Surgery)

## 2023-11-28 ENCOUNTER — Ambulatory Visit: Payer: Medicare HMO | Admitting: Thoracic Surgery (Cardiothoracic Vascular Surgery)

## 2023-11-28 VITALS — BP 118/78 | HR 82 | Resp 20 | Ht 65.0 in | Wt 197.0 lb

## 2023-11-28 DIAGNOSIS — I7121 Aneurysm of the ascending aorta, without rupture: Secondary | ICD-10-CM | POA: Diagnosis not present

## 2023-11-28 NOTE — Progress Notes (Signed)
[address]       [place] [address]             [phone]       HPI: [name] returns for follow-up of his ascending aneurysm.  Dustin Wall is a 72 year old man with a history of hypertension, hyperlipidemia, aortic atherosclerosis, coronary atherosclerosis, ascending aneurysm, hypothyroidism after radioactive iodine, and arthritis.  He has been followed for an ascending aneurysm since 2010.  Originally measured 4.4 cm.  In 2020 it measured 4.6 cm.  He was offered semiannual follow-up but wanted to continue with only annual CT follow-up.  I saw him in the office a year ago.  He was doing well at that time.  Since his last visit he has been feeling well.  No chest pain, pressure, tightness, or shortness of breath.  Remains active.  Monitors his blood pressure regularly.  Has not had any issues with that.  Past Medical History:  Diagnosis Date   Ascending aortic aneurysm (HCC)    dx 2010 ~ 4.4 cm, ..4.7cm 11/11/21   GERD (gastroesophageal reflux disease)    Hyperlipidemia    Hypertension    Hypothyroidism    s/p hyperthyroidism, s/p radioiodine     Current Outpatient Medications  Medication Sig Dispense Refill   amLODipine (NORVASC) 10 MG tablet Take 1 tablet (10 mg total) by mouth daily. 90 tablet 1   atorvastatin (LIPITOR) 80 MG tablet Take 1 tablet (80 mg total) by mouth at bedtime. 90 tablet 1   augmented betamethasone dipropionate (DIPROLENE-AF) 0.05 % cream Apply topically 2 (two) times daily as needed (for rash). 60 g 1   cetirizine (ZYRTEC) 10 MG tablet Take 1 tablet (10 mg total) by mouth daily.     ezetimibe (ZETIA) 10 MG tablet Take 1 tablet (10 mg total) by mouth daily. 90 tablet 1   levothyroxine (SYNTHROID) 125 MCG tablet Take 1 tablet (125 mcg total) by mouth daily before breakfast. 90 tablet 1   metoprolol succinate (TOPROL-XL) 100 MG 24 hr tablet Take 1 tablet (100 mg total) by mouth daily. TAKE WITH OR IMMEDIATELY FOLLOWING A MEAL. 90  tablet 1   Multiple Vitamin (MULTI VITAMIN MENS PO) Take by mouth.     No current facility-administered medications for this visit.    Physical Exam BP 118/78   Pulse 82   Resp 20   Ht 5\' 5"  (1.651 m)   Wt 197 lb (89.4 kg)   SpO2 100% Comment: RA  BMI 32.28 kg/m  72 year old man in no acute distress Alert and oriented x 3 with no focal deficits Lungs clear with equal breath sounds bilaterally No carotid bruits Cardiac regular rate and rhythm with normal S1 and S2 Trace edema lower extremities  Diagnostic Tests: CT CHEST WITHOUT CONTRAST   TECHNIQUE: Multidetector CT imaging of the chest was performed following the standard protocol without IV contrast.   RADIATION DOSE REDUCTION: This exam was performed according to the departmental dose-optimization program which includes automated exposure control, adjustment of the mA and/or kV according to patient size and/or use of iterative reconstruction technique.   COMPARISON:  CT 11/15/2022 and 11/11/2021.   FINDINGS: Cardiovascular: Again demonstrated is atherosclerosis of the aorta, great vessels and coronary arteries. There is stable dilatation of the ascending aorta which measures 4.6 x 4.4 cm in diameter. The aortic arch and descending aorta are normal in caliber. No definite aortic valvular calcifications. Mild central enlargement of the pulmonary arteries. The heart size  is normal. There is no pericardial effusion.   Mediastinum/Nodes: There are no enlarged mediastinal, hilar or axillary lymph nodes.Small mediastinal lymph nodes appear unchanged. No thyroid tissue identified. The esophagus appears unremarkable.   Lungs/Pleura: No pleural effusion or pneumothorax. The lungs are clear.   Upper abdomen: The visualized upper abdomen appears stable, without significant findings. 7 mm hyperdense lesion in the upper pole of the left kidney is unchanged, consistent with a Bosniak 2 cyst. There are small low-density  right renal cysts. No specific follow-up imaging recommended.   Musculoskeletal/Chest wall: There is no chest wall mass or suspicious osseous finding. Multilevel spondylosis.   IMPRESSION: 1. Stable dilatation of the ascending aorta measuring 4.6 cm in diameter. Consider continued imaging surveillance as clinically warranted. 2. No acute chest findings. 3. Stable small mediastinal lymph nodes, likely reactive. 4.  Aortic Atherosclerosis (ICD10-I70.0).     Electronically Signed   By: Carey Bullocks M.D.   On: 11/22/2023 09:40 I personally reviewed the CT images.  No change in the 4.6 cm ascending aorta.  Mild aortic atherosclerosis and coronary atherosclerosis.  Impression: Dustin Wall is a 72 year old man with a history of hypertension, hyperlipidemia, aortic atherosclerosis, coronary atherosclerosis, ascending aneurysm, hypothyroidism after radioactive iodine, and arthritis.  Ascending aneurysm-stable at 4.6 cm.  He wishes to continue with annual follow-up.  Hypertension-blood pressure well-controlled on current regimen.  Hyperlipidemia-on Lipitor  Plan: Return in 1 year with CT chest   Loreli Slot, MD [facility] [phone]

## 2023-12-11 DIAGNOSIS — H524 Presbyopia: Secondary | ICD-10-CM | POA: Diagnosis not present

## 2023-12-19 ENCOUNTER — Encounter: Payer: Self-pay | Admitting: Internal Medicine

## 2023-12-19 ENCOUNTER — Ambulatory Visit: Payer: Medicare HMO | Admitting: Internal Medicine

## 2023-12-19 VITALS — BP 126/80 | HR 64 | Temp 97.4°F | Resp 16 | Ht 65.0 in | Wt 205.0 lb

## 2023-12-19 DIAGNOSIS — E039 Hypothyroidism, unspecified: Secondary | ICD-10-CM | POA: Diagnosis not present

## 2023-12-19 DIAGNOSIS — E785 Hyperlipidemia, unspecified: Secondary | ICD-10-CM

## 2023-12-19 DIAGNOSIS — I10 Essential (primary) hypertension: Secondary | ICD-10-CM | POA: Diagnosis not present

## 2023-12-19 DIAGNOSIS — Z23 Encounter for immunization: Secondary | ICD-10-CM | POA: Diagnosis not present

## 2023-12-19 LAB — LIPID PANEL
Cholesterol: 156 mg/dL (ref 0–200)
HDL: 56 mg/dL (ref >39.00)
LDL Cholesterol: 78 mg/dL (ref 0–99)
NonHDL: 100.18
Total CHOL/HDL Ratio: 3
Triglycerides: 110 mg/dL (ref 0.0–149.0)
VLDL: 22 mg/dL (ref 0.0–40.0)

## 2023-12-19 LAB — BASIC METABOLIC PANEL
BUN: 11 mg/dL (ref 6–23)
CO2: 30 meq/L (ref 19–32)
Calcium: 10 mg/dL (ref 8.4–10.5)
Chloride: 101 meq/L (ref 96–112)
Creatinine, Ser: 0.9 mg/dL (ref 0.40–1.50)
GFR: 84.8 mL/min (ref >60.00)
Glucose, Bld: 80 mg/dL (ref 70–99)
Potassium: 4.4 meq/L (ref 3.5–5.1)
Sodium: 137 meq/L (ref 135–145)

## 2023-12-19 LAB — TSH: TSH: 6.03 u[IU]/mL — ABNORMAL HIGH (ref 0.35–5.50)

## 2023-12-19 NOTE — Progress Notes (Signed)
   Subjective:    Patient ID: Dustin Wall, male    DOB: 08-03-1951, 72 y.o.   MRN: [id number]  DOS:  12/19/2023 Type of visit - description: f/u  Chronic medical problems addressed. He is feeling well. Good med compliance. No recent ambulatory BPs. Saw thoracic surgery, note reviewed. Denies chest pain or difficulty breathing  Review of Systems See above   Past Medical History:  Diagnosis Date   Ascending aortic aneurysm (HCC)    dx 2010 ~ 4.4 cm, ..4.7cm 11/11/21   GERD (gastroesophageal reflux disease)    Hyperlipidemia    Hypertension    Hypothyroidism    s/p hyperthyroidism, s/p radioiodine    Past Surgical History:  Procedure Laterality Date   COLONOSCOPY     ?2012 in [place]   HEMORRHOID SURGERY     MASS EXCISION Right 04/11/2023   Procedure: Right middle finger ulnar proximal phalanx mass excision;  Surgeon: Gomez Cleverly, MD;  Location: [facility];  Service: Orthopedics;  Laterality: Right;   UPPER GASTROINTESTINAL ENDOSCOPY  12/16/2014    Current Outpatient Medications  Medication Instructions   amLODipine (NORVASC) 10 mg, Oral, Daily   atorvastatin (LIPITOR) 80 mg, Oral, Daily at bedtime   augmented betamethasone dipropionate (DIPROLENE-AF) 0.05 % cream Topical, 2 times daily PRN   cetirizine (ZYRTEC) 10 mg, Oral, Daily   ezetimibe (ZETIA) 10 mg, Oral, Daily   levothyroxine (SYNTHROID) 125 mcg, Oral, Daily before breakfast   metoprolol succinate (TOPROL-XL) 100 mg, Oral, Daily, TAKE WITH OR IMMEDIATELY FOLLOWING A MEAL.   Multiple Vitamin (MULTI VITAMIN MENS PO) Take by mouth.       Objective:   Physical Exam BP 126/80   Pulse 64   Temp (!) 97.4 F (36.3 C) (Oral)   Resp 16   Ht 5\' 5"  (1.651 m)   Wt 205 lb (93 kg)   SpO2 92%   BMI 34.11 kg/m  General:   Well developed, NAD, BMI noted. HEENT:  Normocephalic . Face symmetric, atraumatic Lungs:  CTA B Normal respiratory effort, no intercostal retractions, no accessory muscle  use. Heart: RRR,  no murmur.  Lower extremities: no pretibial edema bilaterally  Skin: Not pale. Not jaundice Neurologic:  alert & oriented X3.  Speech normal, gait appropriate for age and unassisted Psych--  Cognition and judgment appear intact.  Cooperative with normal attention span and concentration.  Behavior appropriate. No anxious or depressed appearing.      Assessment     Assessment HTN Hyperlipidemia Hypothyroidism (hypothyroidism  > S/P  radioiodine) Ascending aortic aneurysm DX 2010, 4.5 cm. Sees Dr. Dorris Fetch Vitiligo. Acanthosis Nigricans Varicose veins     PLAN: HTN: On amlodipine, metoprolol, check a BMP Hyperlipidemia: On atorvastatin 80 mg and Zetia, check FLP. Hypothyroidism: Good compliance with Synthroid, check TSH. Ascending aortic aneurysm: Saw thoracic surgery 11/28/2023.  Next visit 1 year Vaccine advise: Agreed to take PNM 20, recommend to consider flu, COVID and shingles shot. RTC 3 months CPX

## 2023-12-19 NOTE — Patient Instructions (Addendum)
Vaccines I recommend: Covid booster Flu shot   Check the  blood pressure regularly Blood pressure goal:  between 110/65 and  135/85. If it is consistently higher or lower, let me know     GO TO THE LAB : Get the blood work     Next visit with me 3 to 4 months for a physical exam , please schedule it at the front desk

## 2023-12-19 NOTE — Assessment & Plan Note (Signed)
HTN: On amlodipine, metoprolol, check a BMP Hyperlipidemia: On atorvastatin 80 mg and Zetia, check FLP. Hypothyroidism: Good compliance with Synthroid, check TSH. Ascending aortic aneurysm: Saw thoracic surgery 11/28/2023.  Next visit 1 year Vaccine advise: Agreed to take PNM 20, recommend to consider flu, COVID and shingles shot. RTC 3 months CPX

## 2023-12-23 ENCOUNTER — Other Ambulatory Visit: Payer: Self-pay | Admitting: Internal Medicine

## 2023-12-23 ENCOUNTER — Encounter: Payer: Self-pay | Admitting: Internal Medicine

## 2023-12-25 NOTE — Addendum Note (Signed)
Addended by: Conrad Raceland D on: 12/25/2023 07:35 AM   Modules accepted: Orders

## 2024-01-05 ENCOUNTER — Encounter: Payer: Self-pay | Admitting: Internal Medicine

## 2024-01-05 ENCOUNTER — Ambulatory Visit (INDEPENDENT_AMBULATORY_CARE_PROVIDER_SITE_OTHER): Payer: Medicare HMO | Admitting: Internal Medicine

## 2024-01-05 VITALS — BP 128/78 | HR 72 | Temp 98.0°F | Resp 16 | Ht 65.0 in | Wt 201.2 lb

## 2024-01-05 DIAGNOSIS — M7989 Other specified soft tissue disorders: Secondary | ICD-10-CM | POA: Diagnosis not present

## 2024-01-05 NOTE — Patient Instructions (Signed)
Vaccines I recommend:  Covid booster

## 2024-01-05 NOTE — Progress Notes (Signed)
   Subjective:    Patient ID: Dustin Wall, male    DOB: 01-05-1951, 73 y.o.   MRN: [id number]  DOS:  01/05/2024 Type of visit - description: Acute, here with his wife  Reports that 2 weeks ago he noted swelling starting from the right foot and going up to the thigh  The swelling already resolved but he is concerned about that. Since then he has some R leg stiffness but no actual knee pain.  Denies chest pain or difficulty breathing. No palpitations. No recent airplane trip or prolonged car trip No major back pain  Review of Systems See above   Past Medical History:  Diagnosis Date   Ascending aortic aneurysm (HCC)    dx 2010 ~ 4.4 cm, ..4.7cm 11/11/21   GERD (gastroesophageal reflux disease)    Hyperlipidemia    Hypertension    Hypothyroidism    s/p hyperthyroidism, s/p radioiodine    Past Surgical History:  Procedure Laterality Date   COLONOSCOPY     ?2012 in [place]   HEMORRHOID SURGERY     MASS EXCISION Right 04/11/2023   Procedure: Right middle finger ulnar proximal phalanx mass excision;  Surgeon: Alyse Agent, MD;  Location: [facility];  Service: Orthopedics;  Laterality: Right;   UPPER GASTROINTESTINAL ENDOSCOPY  12/16/2014    Current Outpatient Medications  Medication Instructions   amLODipine  (NORVASC ) 10 mg, Oral, Daily   atorvastatin  (LIPITOR) 80 mg, Oral, Daily at bedtime   augmented betamethasone  dipropionate (DIPROLENE -AF) 0.05 % cream Topical, 2 times daily PRN   cetirizine  (ZYRTEC ) 10 mg, Oral, Daily   ezetimibe  (ZETIA ) 10 mg, Oral, Daily   levothyroxine  (SYNTHROID ) 125 mcg, Oral, Daily before breakfast   metoprolol  succinate (TOPROL -XL) 100 mg, Oral, Daily, TAKE WITH OR IMMEDIATELY FOLLOWING A MEAL.   Multiple Vitamin (MULTI VITAMIN MENS PO) Take by mouth.       Objective:   Physical Exam BP 128/78   Pulse 72   Temp 98 F (36.7 C) (Oral)   Resp 16   Ht 5' 5 (1.651 m)   Wt 201 lb 4 oz (91.3 kg)   SpO2 98%   BMI 33.49 kg/m   General:   Well developed, NAD, BMI noted. HEENT:  Normocephalic . Face symmetric, atraumatic Lungs:  CTA B Normal respiratory effort, no intercostal retractions, no accessory muscle use. Heart: RRR,  no murmur.  Lower extremities: Calves are symmetric, soft and nontender. Has few varicose veins without phlebitis. Normal pedal pulses bilaterally Skin: Not pale. Not jaundice Neurologic:  alert & oriented X3.  Speech normal, gait appropriate for age and unassisted. Motor and DTR symmetric Psych--  Cognition and judgment appear intact.  Cooperative with normal attention span and concentration.  Behavior appropriate. No anxious or depressed appearing.      Assessment     Assessment HTN Hyperlipidemia Hypothyroidism (hypothyroidism  > S/P  radioiodine) Ascending aortic aneurysm DX 2010, 4.5 cm. Sees Dr. Kerrin Vitiligo. Acanthosis Nigricans Varicose veins     PLAN: R leg swelling: Started 2 weeks ago, self resolved, at this point I do not see evidence of a DVT, no risk factors.  We agreed on observation for now, she will come back if the swelling returns. R leg stiffness: Started after the swelling, better with walking, recommend stretching and continue exercising.  If symptoms persist, we could ask sports medicine to see patient. RTC as scheduled for April

## 2024-01-07 NOTE — Assessment & Plan Note (Signed)
 R leg swelling: Started 2 weeks ago, self resolved, at this point I do not see evidence of a DVT, no risk factors.  We agreed on observation for now, she will come back if the swelling returns. R leg stiffness: Started after the swelling, better with walking, recommend stretching and continue exercising.  If symptoms persist, we could ask sports medicine to see patient. RTC as scheduled for April

## 2024-01-08 DIAGNOSIS — Z0100 Encounter for examination of eyes and vision without abnormal findings: Secondary | ICD-10-CM | POA: Diagnosis not present

## 2024-02-05 ENCOUNTER — Other Ambulatory Visit (INDEPENDENT_AMBULATORY_CARE_PROVIDER_SITE_OTHER): Payer: Medicare HMO

## 2024-02-05 DIAGNOSIS — E039 Hypothyroidism, unspecified: Secondary | ICD-10-CM | POA: Diagnosis not present

## 2024-02-05 LAB — TSH: TSH: 0.25 u[IU]/mL — ABNORMAL LOW (ref 0.35–5.50)

## 2024-02-07 ENCOUNTER — Encounter: Payer: Self-pay | Admitting: Internal Medicine

## 2024-02-07 MED ORDER — LEVOTHYROXINE SODIUM 112 MCG PO TABS: 112.0000 ug | ORAL_TABLET | Freq: Every day | ORAL | 0 refills | Status: DC

## 2024-02-07 NOTE — Addendum Note (Signed)
Addended byConrad Leetsdale D on: 02/07/2024 12:47 PM   Modules accepted: Orders

## 2024-02-10 ENCOUNTER — Other Ambulatory Visit: Payer: Self-pay | Admitting: Internal Medicine

## 2024-02-10 DIAGNOSIS — I10 Essential (primary) hypertension: Secondary | ICD-10-CM

## 2024-03-22 ENCOUNTER — Other Ambulatory Visit (INDEPENDENT_AMBULATORY_CARE_PROVIDER_SITE_OTHER)

## 2024-03-22 DIAGNOSIS — E039 Hypothyroidism, unspecified: Secondary | ICD-10-CM | POA: Diagnosis not present

## 2024-03-22 LAB — TSH: TSH: 1.48 u[IU]/mL (ref 0.35–5.50)

## 2024-03-25 ENCOUNTER — Encounter: Payer: Self-pay | Admitting: Internal Medicine

## 2024-04-08 ENCOUNTER — Encounter: Payer: Self-pay | Admitting: Internal Medicine

## 2024-04-08 ENCOUNTER — Ambulatory Visit (INDEPENDENT_AMBULATORY_CARE_PROVIDER_SITE_OTHER): Payer: Medicare HMO | Admitting: Internal Medicine

## 2024-04-08 VITALS — BP 126/64 | HR 70 | Temp 98.0°F | Resp 16 | Ht 65.0 in | Wt 199.2 lb

## 2024-04-08 DIAGNOSIS — Z0000 Encounter for general adult medical examination without abnormal findings: Secondary | ICD-10-CM

## 2024-04-08 DIAGNOSIS — E785 Hyperlipidemia, unspecified: Secondary | ICD-10-CM | POA: Diagnosis not present

## 2024-04-08 DIAGNOSIS — R972 Elevated prostate specific antigen [PSA]: Secondary | ICD-10-CM

## 2024-04-08 DIAGNOSIS — E039 Hypothyroidism, unspecified: Secondary | ICD-10-CM

## 2024-04-08 DIAGNOSIS — I10 Essential (primary) hypertension: Secondary | ICD-10-CM | POA: Diagnosis not present

## 2024-04-08 LAB — CBC WITH DIFFERENTIAL/PLATELET
Basophils Absolute: 0 10*3/uL (ref 0.0–0.1)
Basophils Relative: 0.8 % (ref 0.0–3.0)
Eosinophils Absolute: 0.1 10*3/uL (ref 0.0–0.7)
Eosinophils Relative: 1.4 % (ref 0.0–5.0)
HCT: 46.8 % (ref 39.0–52.0)
Hemoglobin: 15 g/dL (ref 13.0–17.0)
Lymphocytes Relative: 47.3 % — ABNORMAL HIGH (ref 12.0–46.0)
Lymphs Abs: 1.9 10*3/uL (ref 0.7–4.0)
MCHC: 32 g/dL (ref 30.0–36.0)
MCV: 87.6 fl (ref 78.0–100.0)
Monocytes Absolute: 0.4 10*3/uL (ref 0.1–1.0)
Monocytes Relative: 9 % (ref 3.0–12.0)
Neutro Abs: 1.7 10*3/uL (ref 1.4–7.7)
Neutrophils Relative %: 41.5 % — ABNORMAL LOW (ref 43.0–77.0)
Platelets: 193 10*3/uL (ref 150.0–400.0)
RBC: 5.34 Mil/uL (ref 4.22–5.81)
RDW: 16 % — ABNORMAL HIGH (ref 11.5–15.5)
WBC: 4 10*3/uL (ref 4.0–10.5)

## 2024-04-08 LAB — COMPREHENSIVE METABOLIC PANEL WITH GFR
ALT: 42 U/L (ref 0–53)
AST: 31 U/L (ref 0–37)
Albumin: 4.5 g/dL (ref 3.5–5.2)
Alkaline Phosphatase: 61 U/L (ref 39–117)
BUN: 13 mg/dL (ref 6–23)
CO2: 29 meq/L (ref 19–32)
Calcium: 9.6 mg/dL (ref 8.4–10.5)
Chloride: 102 meq/L (ref 96–112)
Creatinine, Ser: 0.9 mg/dL (ref 0.40–1.50)
GFR: 84.62 mL/min (ref >60.00)
Glucose, Bld: 110 mg/dL — ABNORMAL HIGH (ref 70–99)
Potassium: 4.3 meq/L (ref 3.5–5.1)
Sodium: 137 meq/L (ref 135–145)
Total Bilirubin: 0.8 mg/dL (ref 0.2–1.2)
Total Protein: 7.3 g/dL (ref 6.0–8.3)

## 2024-04-08 LAB — PSA: PSA: 3.59 ng/mL (ref 0.10–4.00)

## 2024-04-08 MED ORDER — EZETIMIBE 10 MG PO TABS: 10.0000 mg | ORAL_TABLET | Freq: Every day | ORAL | 1 refills | Status: DC

## 2024-04-08 MED ORDER — ATORVASTATIN CALCIUM 80 MG PO TABS: 80.0000 mg | ORAL_TABLET | Freq: Every day | ORAL | 1 refills | Status: DC

## 2024-04-08 NOTE — Patient Instructions (Addendum)
 INSTRUCTIONS  FOR TODAY  Please read information about the healthcare power of attorney below.  Vaccines I recommend: Flu shot every fall COVID booster Shingles shots RSV vaccine  Check the  blood pressure regularly Blood pressure goal:  between 110/65 and  135/85. If it is consistently higher or lower, let me know     GO TO THE LAB : Get the blood work     Next office visit for a checkup in 5 to 6 months Please make an appointment before you leave today Depending on your blood or XRs results it might be necessary to come back sooner       "Health Care Power of attorney" (Also know as a  "Living will" or  Advance care planning documents)  If you already have a living will or healthcare power of attorney, is recommended you bring the copy to be scanned in your chart.   The document will be available to all the doctors you see in the system.  If you are over 13 y/o and don't have the document, please read:  Advance care planning is a process that supports adults in  understanding and sharing their preferences regarding future medical care.  The patient's preferences are recorded in documents called Advance Directives and the can be modified at any time while the patient is in full mental capacity.     More information at: [email or web address]

## 2024-04-08 NOTE — Assessment & Plan Note (Signed)
 Here for CPX . We also addressed the following issues: HTN: BP looks very good today,Recommend to check at home, continue amlodipine, metoprolol.  Check CMP, CBC. Hyperlipidemia: On atorvastatin, Zetia.  Refills sent.  Last FLP satisfactory. Hypothyroidism: Last TSH at goal. Neck pain: Described as stiffness, neurological exam is negative, no red flags.  We talked about possibly a PT referral but he declines, will continue doing some exercises at home and call me if he needs further help. RTC 5 to 6 months

## 2024-04-08 NOTE — Progress Notes (Signed)
 Subjective:    Patient ID: Dustin Wall, male    DOB: 11-May-1951, 73 y.o.   MRN: [id number]  DOS:  04/08/2024 Type of visit - description: CPX  Here for CPX Chronic medical problems addressed Also for few weeks he is having some stiffness at the base of the neck bilaterally. No motor deficits, no lower extremity paresthesias or weakness Some pain radiates to the left arm up to the fingers but that has not been consistent or severe. Has taking Tylenol occasionally.   Review of Systems  Other than above, a 14 point review of systems is negative   Past Medical History:  Diagnosis Date   Ascending aortic aneurysm (HCC)    dx 2010 ~ 4.4 cm, ..4.7cm 11/11/21   GERD (gastroesophageal reflux disease)    Hyperlipidemia    Hypertension    Hypothyroidism    s/p hyperthyroidism, s/p radioiodine    Past Surgical History:  Procedure Laterality Date   COLONOSCOPY     ?2012 in [place]   HEMORRHOID SURGERY     MASS EXCISION Right 04/11/2023   Procedure: Right middle finger ulnar proximal phalanx mass excision;  Surgeon: Gomez Cleverly, MD;  Location: [facility];  Service: Orthopedics;  Laterality: Right;   UPPER GASTROINTESTINAL ENDOSCOPY  12/16/2014   Social History   Socioeconomic History   Marital status: Married    Spouse name: Not on file   Number of children: 0   Years of education: Not on file   Highest education level: Not on file  Occupational History   Occupation: fully retired at age 38, sanitation  Tobacco Use   Smoking status: Never   Smokeless tobacco: Never  Vaping Use   Vaping status: Never Used  Substance and Sexual Activity   Alcohol use: Yes    Comment: beer and wine occasional   Drug use: No   Sexual activity: Yes  Other Topics Concern   Not on file  Social History Narrative   Lives w/ wife   Social Drivers of Health   Financial Resource Strain: Low Risk  (11/08/2021)   Overall Financial Resource Strain (CARDIA)    Difficulty of Paying  Living Expenses: Not hard at all  Food Insecurity: No Food Insecurity (11/08/2021)   Hunger Vital Sign    Worried About Running Out of Food in the Last Year: Never true    Ran Out of Food in the Last Year: Never true  Transportation Needs: No Transportation Needs (11/08/2021)   PRAPARE - Administrator, Civil Service (Medical): No    Lack of Transportation (Non-Medical): No  Physical Activity: Sufficiently Active (11/08/2021)   Exercise Vital Sign    Days of Exercise per Week: 5 days    Minutes of Exercise per Session: 60 min  Stress: No Stress Concern Present (11/08/2021)   [employer] of Occupational Health - Occupational Stress Questionnaire    Feeling of Stress : Not at all  Social Connections: Socially Isolated (11/08/2021)   Social Connection and Isolation Panel [NHANES]    Frequency of Communication with Friends and Family: Once a week    Frequency of Social Gatherings with Friends and Family: Once a week    Attends Religious Services: Never    Database administrator or Organizations: No    Attends Banker Meetings: Never    Marital Status: Married  Catering manager Violence: Not At Risk (11/08/2021)   Humiliation, Afraid, Rape, and Kick questionnaire    Fear of Current  or Ex-Partner: No    Emotionally Abused: No    Physically Abused: No    Sexually Abused: No     Current Outpatient Medications  Medication Instructions   amLODipine (NORVASC) 10 mg, Oral, Daily   atorvastatin (LIPITOR) 80 mg, Oral, Daily at bedtime   cetirizine (ZYRTEC) 10 mg, Oral, Daily   ezetimibe (ZETIA) 10 mg, Oral, Daily   levothyroxine (SYNTHROID) 112 mcg, Oral, Daily before breakfast   metoprolol succinate (TOPROL-XL) 100 mg, Oral, Daily, TAKE WITH OR IMMEDIATELY FOLLOWING A MEAL.   Multiple Vitamin (MULTI VITAMIN MENS PO) Take by mouth.       Objective:   Physical Exam BP 126/64   Pulse 70   Temp 98 F (36.7 C) (Oral)   Resp 16   Ht 5\' 5"  (1.651 m)   Wt  199 lb 4 oz (90.4 kg)   SpO2 97%   BMI 33.16 kg/m  General: Well developed, NAD, BMI noted Neck: No  thyromegaly.  No TTP at the cervical spine.  ROM normal. HEENT:  Normocephalic . Face symmetric, atraumatic Lungs:  CTA B Normal respiratory effort, no intercostal retractions, no accessory muscle use. Heart: RRR,  no murmur.  Abdomen:  Not distended, soft, non-tender. No rebound or rigidity.   Lower extremities: no pretibial edema bilaterally  Skin: Exposed areas without rash. Not pale. Not jaundice Neurologic:  alert & oriented X3.  Speech normal, gait appropriate for age and unassisted Strength symmetric and appropriate for age.  DTR symmetric throughout. Psych: Cognition and judgment appear intact.  Cooperative with normal attention span and concentration.  Behavior appropriate. No anxious or depressed appearing.     Assessment    Assessment HTN Hyperlipidemia Hypothyroidism (hypothyroidism  > S/P  radioiodine) Ascending aortic aneurysm DX 2010, 4.5 cm. Sees Dr. Luna Salinas Vitiligo. Acanthosis Nigricans Varicose veins    PLAN: Here for CPX - PNM 20: 11/2023. - zostavax 2013  (pt denies) - Vaccines I recommend:  Shingrix, flu shot, COVID booster , RSV -CCS: Normal colonoscopy December 2012 (had external hemorrhoids).  C-scope [id number].  Next 10 years per GI letter  -Prostate cancer screening:  +FH 2 brothers, asymptomatic, check PSA. -Lifestyle: Counseled. -Labs: CMP CBC PSA -Healthcare POA: Permission provided. We also addressed the following issues: HTN: BP looks very good today,Recommend to check at home, continue amlodipine, metoprolol.  Check CMP, CBC. Hyperlipidemia: On atorvastatin, Zetia.  Refills sent.  Last FLP satisfactory. Hypothyroidism: Last TSH at goal. Neck pain: Described as stiffness, neurological exam is negative, no red flags.  We talked about possibly a PT referral but he declines, will continue doing some exercises at home and call me if he  needs further help. RTC 5 to 6 months

## 2024-04-08 NOTE — Assessment & Plan Note (Signed)
 Here for CPX - PNM 20: 11/2023. - zostavax 2013  (pt denies) - Vaccines I recommend:  Shingrix, flu shot, COVID booster , RSV -CCS: Normal colonoscopy December 2012 (had external hemorrhoids).  C-scope [id number].  Next 10 years per GI letter  -Prostate cancer screening:  +FH 2 brothers, asymptomatic, check PSA. -Lifestyle: Counseled. -Labs: CMP CBC PSA -Healthcare POA: Permission provided.

## 2024-04-10 ENCOUNTER — Encounter: Payer: Self-pay | Admitting: Internal Medicine

## 2024-04-10 NOTE — Addendum Note (Signed)
 Addended by: Avion Kutzer D on: 04/10/2024 03:48 PM   Modules accepted: Orders

## 2024-04-13 ENCOUNTER — Other Ambulatory Visit: Payer: Self-pay | Admitting: Internal Medicine

## 2024-05-08 ENCOUNTER — Other Ambulatory Visit: Payer: Self-pay | Admitting: Internal Medicine

## 2024-05-15 ENCOUNTER — Encounter: Payer: Self-pay | Admitting: Pharmacist

## 2024-05-15 NOTE — Progress Notes (Signed)
 Pharmacy Quality Measure Review  This patient was noted to have low adherence in 2024 for atorvastatin  - following adherence in 2025.   Medication: atorvastatin   Last fill date: 04/08/2024 for 90 day supply. Fill previous to this one was 07/24/2024 for 90 days.   Lab Results  Component Value Date   CHOL 156 12/19/2023   HDL 56.00 12/19/2023   LDLCALC 78 12/19/2023   TRIG 110.0 12/19/2023   CHOLHDL 3 12/19/2023    Patient has filled atorvastatin  once in 2025 but was not on time. Will continue to follow refills and address adherence as needed.  He does have refills available when needed. Follow up with PCP scheduled already for 09/2024.   Cecilie Coffee, PharmD Clinical Pharmacist [facility] Primary [facility]  Population Health [phone]

## 2024-05-27 DIAGNOSIS — M4692 Unspecified inflammatory spondylopathy, cervical region: Secondary | ICD-10-CM | POA: Diagnosis not present

## 2024-05-27 DIAGNOSIS — M25512 Pain in left shoulder: Secondary | ICD-10-CM | POA: Diagnosis not present

## 2024-05-27 DIAGNOSIS — M25511 Pain in right shoulder: Secondary | ICD-10-CM | POA: Diagnosis not present

## 2024-06-04 DIAGNOSIS — M542 Cervicalgia: Secondary | ICD-10-CM | POA: Diagnosis not present

## 2024-06-12 DIAGNOSIS — M4692 Unspecified inflammatory spondylopathy, cervical region: Secondary | ICD-10-CM | POA: Diagnosis not present

## 2024-07-16 ENCOUNTER — Other Ambulatory Visit (INDEPENDENT_AMBULATORY_CARE_PROVIDER_SITE_OTHER)

## 2024-07-16 DIAGNOSIS — R972 Elevated prostate specific antigen [PSA]: Secondary | ICD-10-CM | POA: Diagnosis not present

## 2024-07-16 LAB — URINALYSIS, ROUTINE W REFLEX MICROSCOPIC
Bilirubin Urine: NEGATIVE
Hgb urine dipstick: NEGATIVE
Ketones, ur: NEGATIVE
Leukocytes,Ua: NEGATIVE
Nitrite: NEGATIVE
RBC / HPF: NONE SEEN (ref 0–?)
Specific Gravity, Urine: 1.02 (ref 1.000–1.030)
Total Protein, Urine: NEGATIVE
Urine Glucose: NEGATIVE
Urobilinogen, UA: 0.2 (ref 0.0–1.0)
pH: 6 (ref 5.0–8.0)

## 2024-07-16 LAB — PSA: PSA: 3.16 ng/mL (ref 0.10–4.00)

## 2024-07-17 ENCOUNTER — Ambulatory Visit: Payer: Self-pay | Admitting: Internal Medicine

## 2024-07-17 LAB — URINE CULTURE
MICRO NUMBER:: 16729720
Result:: NO GROWTH
SPECIMEN QUALITY:: ADEQUATE

## 2024-07-29 ENCOUNTER — Telehealth: Payer: Self-pay | Admitting: Pharmacist

## 2024-07-29 NOTE — Progress Notes (Signed)
 Pharmacy Quality Measure Review  This patient is appearing on a report for being at risk of failing the adherence measure for cholesterol (statin) medications this calendar year.   Medication: atorvastatin   Last fill date: 04/08/2024 for 90 day supply He is also past due to fill ezetimibe  10mg .   Verified with [facility] that they do have refills on file.  Spoke with patient - Dustin Wall states he is taking both atorvastatin  and ezetimibe  - 1 full tablet daily but he has not finished his current bottle. He states he does not need refills at this time.  Reminded patient his pharmacy has refills on both medications when he needs them.   Madelin Ray, PharmD Clinical Pharmacist [facility] Primary Care  Population Health [phone]

## 2024-08-25 ENCOUNTER — Other Ambulatory Visit: Payer: Self-pay | Admitting: Internal Medicine

## 2024-08-25 DIAGNOSIS — I10 Essential (primary) hypertension: Secondary | ICD-10-CM

## 2024-10-04 ENCOUNTER — Encounter: Payer: Self-pay | Admitting: Internal Medicine

## 2024-10-04 ENCOUNTER — Ambulatory Visit: Admitting: Internal Medicine

## 2024-10-04 VITALS — BP 118/82 | HR 67 | Temp 98.0°F | Resp 16 | Ht 65.0 in | Wt 193.1 lb

## 2024-10-04 DIAGNOSIS — R739 Hyperglycemia, unspecified: Secondary | ICD-10-CM

## 2024-10-04 DIAGNOSIS — E039 Hypothyroidism, unspecified: Secondary | ICD-10-CM | POA: Diagnosis not present

## 2024-10-04 DIAGNOSIS — I10 Essential (primary) hypertension: Secondary | ICD-10-CM

## 2024-10-04 DIAGNOSIS — Z23 Encounter for immunization: Secondary | ICD-10-CM

## 2024-10-04 LAB — LIPID PANEL
Cholesterol: 109 mg/dL (ref 0–200)
HDL: 46.8 mg/dL (ref >39.00)
LDL Cholesterol: 51 mg/dL (ref 0–99)
NonHDL: 62.58
Total CHOL/HDL Ratio: 2
Triglycerides: 60 mg/dL (ref 0.0–149.0)
VLDL: 12 mg/dL (ref 0.0–40.0)

## 2024-10-04 LAB — HEMOGLOBIN A1C: Hgb A1c MFr Bld: 6 % (ref 4.6–6.5)

## 2024-10-04 LAB — TSH: TSH: 2.53 u[IU]/mL (ref 0.35–5.50)

## 2024-10-04 NOTE — Patient Instructions (Addendum)
 GO TO THE LAB :  Get the blood work    Then, go to the front desk for the checkout Please make an appointment for a physical exam, April 2026  Continue checking your blood pressure regularly Blood pressure goal:  between 110/65 and  135/85. If it is consistently higher or lower, let me know  You got a flu shot today, consider COVID-vaccine.   Please read more detailed instructions below     HYPERTENSION: Your blood pressure is stable with your current medications. -Continue taking metoprolol  and amlodipine  as prescribed.  HYPERLIPIDEMIA: Your cholesterol levels are well-controlled. -Continue taking atorvastatin  80 mg and Zetia  as prescribed. -Recheck cholesterol levels today  HYPOTHYROIDISM: Your thyroid  condition is managed with levothyroxine . -Continue taking Synthroid  as prescribed. - Checking your thyroid  levels today

## 2024-10-04 NOTE — Progress Notes (Signed)
   Subjective:    Patient ID: Dustin Wall, male    DOB: [birth year], 73 y.o.   MRN: [id number]  DOS:  10/04/2024 Routine checkup  Discussed the use of AI scribe software for clinical note transcription with the patient, who gave verbal consent to proceed.  History of Present Illness Hypertension - Blood pressure is stable at home with a recent reading of 110/80 - Currently taking metoprolol  and amlodipine  - No new symptoms related to blood pressure  Hyperlipidemia - Cholesterol management includes atorvastatin  80 mg and Zetia  - Recent LDL is 78  Hypothyroidism - Takes Synthroid  for thyroid  hormone replacement  Immunization status - Not interested in receiving a COVID vaccine at this time   Review of Systems See above   Past Medical History:  Diagnosis Date   Ascending aortic aneurysm    dx 2010 ~ 4.4 cm, ..4.7cm 11/11/21   GERD (gastroesophageal reflux disease)    Hyperlipidemia    Hypertension    Hypothyroidism    s/p hyperthyroidism, s/p radioiodine    Past Surgical History:  Procedure Laterality Date   COLONOSCOPY     ?2012 in [place]   HEMORRHOID SURGERY     MASS EXCISION Right 04/11/2023   Procedure: Right middle finger ulnar proximal phalanx mass excision;  Surgeon: Alyse Agent, MD;  Location: [facility];  Service: Orthopedics;  Laterality: Right;   UPPER GASTROINTESTINAL ENDOSCOPY  12/16/2014    Current Outpatient Medications  Medication Instructions   amLODipine  (NORVASC ) 10 mg, Oral, Daily   atorvastatin  (LIPITOR) 80 mg, Oral, Daily at bedtime   cetirizine  (ZYRTEC ) 10 mg, Oral, Daily   ezetimibe  (ZETIA ) 10 mg, Oral, Daily   levothyroxine  (SYNTHROID ) 112 mcg, Oral, Daily before breakfast   metoprolol  succinate (TOPROL -XL) 100 mg, Oral, Daily, Take with or immediately following a meal   Multiple Vitamin (MULTI VITAMIN MENS PO) Take by mouth.       Objective:   Physical Exam BP 118/82   Pulse 67   Temp 98 F (36.7 C) (Oral)   Resp 16    Ht 5' 5 (1.651 m)   Wt 193 lb 2 oz (87.6 kg)   SpO2 97%   BMI 32.14 kg/m  General:   Well developed, NAD, BMI noted. HEENT:  Normocephalic . Face symmetric, atraumatic Lungs:  CTA B Normal respiratory effort, no intercostal retractions, no accessory muscle use. Heart: RRR,  no murmur.  Lower extremities: no pretibial edema bilaterally  Skin: Not pale. Not jaundice Neurologic:  alert & oriented X3.  Speech normal, gait appropriate for age and unassisted Psych--  Cognition and judgment appear intact.  Cooperative with normal attention span and concentration.  Behavior appropriate. No anxious or depressed appearing.      Assessment   Assessment HTN Hyperlipidemia Hypothyroidism (hypothyroidism  > S/P  radioiodine) Ascending aortic aneurysm DX 2010, 4.5 cm. Sees Dr. Kerrin Vitiligo. Acanthosis Nigricans Varicose veins     Assessment & Plan HTN: Controlled, continue amlodipine  and metoprolol . High cholesterol: LDL cholesterol well-controlled at 78 mg/dL with atorvastatin  and ezetimibe . Recheck cholesterol levels. Hypothyroidism Managed with levothyroxine .  Check a TSH. Elevated PSA: Last PSA stable at 3.1 (07/16/2024).  UA urine culture essentially negative.  Recheck on RTC Hyperglycemia: Per chart review, check A1c General Health Maintenance Agreed to proceed with a flu shot today  Encourage consideration of COVID booster, available at pharmacy. pros>cons RTC April 2026 CPX

## 2024-10-05 ENCOUNTER — Ambulatory Visit: Payer: Self-pay | Admitting: Internal Medicine

## 2024-10-05 NOTE — Assessment & Plan Note (Signed)
 HTN: Controlled, continue amlodipine  and metoprolol . High cholesterol: LDL cholesterol well-controlled at 78 mg/dL with atorvastatin  and ezetimibe . Recheck cholesterol levels. Hypothyroidism Managed with levothyroxine .  Check a TSH. Elevated PSA: Last PSA stable at 3.1 (07/16/2024).  UA urine culture essentially negative.  Recheck on RTC Hyperglycemia: Per chart review, check A1c General Health Maintenance Agreed to proceed with a flu shot today  Encourage consideration of COVID booster, available at pharmacy. pros>cons RTC April 2026 CPX

## 2024-10-08 ENCOUNTER — Ambulatory Visit: Admitting: Internal Medicine

## 2024-10-11 ENCOUNTER — Encounter: Payer: Self-pay | Admitting: Pharmacist

## 2024-10-11 ENCOUNTER — Other Ambulatory Visit: Payer: Self-pay | Admitting: Pharmacist

## 2024-10-11 MED ORDER — ATORVASTATIN CALCIUM 80 MG PO TABS: 80.0000 mg | ORAL_TABLET | Freq: Every day | ORAL | 1 refills | Status: AC

## 2024-10-11 MED ORDER — EZETIMIBE 10 MG PO TABS: 10.0000 mg | ORAL_TABLET | Freq: Every day | ORAL | 1 refills | Status: AC

## 2024-10-11 NOTE — Progress Notes (Signed)
 Pharmacy Quality Measure Review  This patient was noted to have low adherence in 2024 for atorvastatin  - following adherence in 2025.   Medication: atorvastatin   Last fill date: 07/06/2024 for 90 day supply. Fill previous to this one was 04/08/2024 for 90 days.   Lab Results  Component Value Date   CHOL 109 10/04/2024   HDL 46.80 10/04/2024   LDLCALC 51 10/04/2024   TRIG 60.0 10/04/2024   CHOLHDL 2 10/04/2024    Patient has filled atorvastatin  twice in 2025 but was not on time. He has no refills remaining. His next appointment with PCP is April 2025.   Sent in updated Rx for both atorvastatin  and ezetimibe  - 90 day supply with 1 refill.   Madelin Ray, PharmD Clinical Pharmacist [facility] Primary Care  Population Health [phone]

## 2024-11-05 ENCOUNTER — Other Ambulatory Visit: Payer: Self-pay | Admitting: Thoracic Surgery (Cardiothoracic Vascular Surgery)

## 2024-11-05 DIAGNOSIS — I7121 Aneurysm of the ascending aorta, without rupture: Secondary | ICD-10-CM

## 2024-11-19 ENCOUNTER — Other Ambulatory Visit: Payer: Self-pay | Admitting: Internal Medicine

## 2024-11-27 ENCOUNTER — Ambulatory Visit (HOSPITAL_COMMUNITY)
Admission: RE | Admit: 2024-11-27 | Discharge: 2024-11-27 | Disposition: A | Discharge status: Home / Self Care | Source: Ambulatory Visit | Attending: Cardiology | Admitting: Cardiology

## 2024-11-27 DIAGNOSIS — I7121 Aneurysm of the ascending aorta, without rupture: Secondary | ICD-10-CM | POA: Insufficient documentation

## 2024-12-03 ENCOUNTER — Ambulatory Visit

## 2024-12-03 VITALS — BP 115/74 | HR 78 | Resp 20 | Ht 65.0 in | Wt 201.0 lb

## 2024-12-03 DIAGNOSIS — I7121 Aneurysm of the ascending aorta, without rupture: Secondary | ICD-10-CM

## 2024-12-03 NOTE — Patient Instructions (Signed)

## 2024-12-03 NOTE — Progress Notes (Signed)
 [address] [place] [address]             [id number]            Miliano Cotten [phone] April 15, 1951   History of Present Illness:  Dustin Wall is a 73 year old man with medical history of hypertension, hypothyroidism, vitiligo, and hyperlipidemia who presents for continued follow up of ascending thoracic aortic aneurysm. He has been followed by our clinic since 2015.  Aneurysm has stayed stable in size and has measured 4.5 cm -4.7 cm.  On recent CT of chest ascending thoracic aortic aneurysm measured 4.6 cm.   He presents to the clinic today with his wife and reports that he has been doing well.  His blood pressure is well controlled with current medications. He is active and denies heavy lifting. He denies chest pain, shortness of breath and lower leg swelling.    Current Outpatient Medications on File Prior to Visit  Medication Sig Dispense Refill   amLODipine  (NORVASC ) 10 MG tablet Take 1 tablet (10 mg total) by mouth daily. 90 tablet 1   atorvastatin  (LIPITOR) 80 MG tablet Take 1 tablet (80 mg total) by mouth at bedtime. 90 tablet 1   cetirizine  (ZYRTEC ) 10 MG tablet Take 1 tablet (10 mg total) by mouth daily.     ezetimibe  (ZETIA ) 10 MG tablet Take 1 tablet (10 mg total) by mouth daily. 90 tablet 1   levothyroxine  (SYNTHROID ) 112 MCG tablet Take 1 tablet (112 mcg total) by mouth daily before breakfast. 90 tablet 1   metoprolol  succinate (TOPROL -XL) 100 MG 24 hr tablet Take 1 tablet (100 mg total) by mouth daily. Take with or immediately following a meal 90 tablet 1   Multiple Vitamin (MULTI VITAMIN MENS PO) Take by mouth.     No current facility-administered medications on file prior to visit.     ROS: Review of Systems  Constitutional: Negative.  Negative for malaise/fatigue.  Respiratory: Negative.  Negative for cough, shortness of breath and wheezing.   Cardiovascular: Negative.  Negative for chest pain and leg swelling.      BP  115/74   Pulse 78   Resp 20   Ht 5' 5 (1.651 m)   Wt 201 lb (91.2 kg)   SpO2 98% Comment: RA  BMI 33.45 kg/m   Physical Exam Constitutional:      Appearance: Normal appearance.  HENT:     Head: Normocephalic and atraumatic.  Skin:    General: Skin is warm and dry.  Neurological:     General: No focal deficit present.     Mental Status: He is alert and oriented to person, place, and time.      Imaging: EXAM: CT CHEST WITHOUT CONTRAST 11/27/2024 10:20:18 AM   TECHNIQUE: CT of the chest was performed without the administration of intravenous contrast. Multiplanar reformatted images are provided for review. Automated exposure control, iterative reconstruction, and/or weight based adjustment of the mA/kV was utilized to reduce the radiation dose to as low as reasonably achievable.   COMPARISON: 11/22/2023   CLINICAL HISTORY: Follow up thoracic aortic aneurysm.   FINDINGS:   MEDIASTINUM: Heart: Mild calcifications over the 3 vessel coronary arteries. Pericardium: Unremarkable. The central airways are clear. Thoracic aorta again demonstrates aneurysmal dilatation of the ascending thoracic aorta measuring 4.6 cm in AP diameter. This is not significantly changed. There is mild calcified plaque throughout the thoracic aorta. The aortic arch  and descending thoracic aorta are normal in caliber. Pulmonary arterial system and remaining vascular structures are unremarkable.   LYMPH NODES: No significant mediastinal or hilar adenopathy. No axillary lymphadenopathy.   LUNGS AND PLEURA: Lungs are adequately inflated. No acute airspace process or effusion. Airways are normal. No concerning nodules/masses. No pneumothorax.   SOFT TISSUES/BONES: No acute abnormality of the bones or soft tissues.   UPPER ABDOMEN: Limited images through the upper abdomen demonstrate mild calcified plaque over the abdominal aorta. Subcentimeter hyperdense mass over the upper pole left kidney  with Hounsfield units of 79 compatible with a hemorrhagic cyst and unchanged. Simple cyst over the upper pole right kidney unchanged. Minimal cholelithiasis. The remainder of the upper abdomen is unchanged.   IMPRESSION: 1. Ascending thoracic aortic aneurysm measuring 4.6 cm which is stable. Recommend semi-annual imaging followup by CTA or MRA and referral to cardiothoracic surgery if not already obtained. This recommendation follows 2010 ACCF/AHA/AATS/ACR/ASA/SCA/SCAI/SIR/STS/SVM Guidelines for the Diagnosis and Management of Patients With Thoracic Aortic Disease. Circulation. 2010; 121: [id number] 2. No acute cardiopulmonary abnormality. 3. Aortic atherosclerosis. Coronary artery calcifications involving all three vessels. 4. Subcentimeter hyperdense left renal upper pole lesion compatible with a stable hemorrhagic cyst. Simple right renal upper pole cyst, stable. 5. Minimal cholelithiasis.   Electronically signed by: Toribio Agreste MD 11/27/2024 11:04 AM EST RP Workstation: [id number]      A/P: Aneurysm of ascending aorta without rupture -4.6 cm ascending thoracic aortic aneurysm on CT scan of chest. -We discussed the natural history and and risk factors for growth of ascending aortic aneurysms. Discussed recommendations to minimize the risk of further expansion or dissection including careful blood pressure control, avoidance of contact sports and heavy lifting, attention to lipid management.  We covered the importance of staying never user of tobacco.  The patient does not yet meet surgical criteria of >5.5cm. The patient is aware of signs and symptoms of aortic dissection and when to present to the emergency department   -Follow up in one year with CT scan of chest for continued surveillance     Risk Modification:  Statin:  atorvastatin    Smoking cessation instruction/counseling given:  never user  Patient was counseled on importance of Blood Pressure Control  They are  instructed to contact their Primary Care Physician if they start to have blood pressure readings over 130s/90s. Do not ever stop blood pressure medications on your own, unless instructed by healthcare professional.  Please avoid use of Fluoroquinolones as this can potentially increase your risk of Aortic Rupture and/or Dissection  Patient educated on signs and symptoms of Aortic Dissection, handout also provided in AVS  Manuelita CHRISTELLA Rough, PA-C 12/03/24

## 2024-12-07 ENCOUNTER — Other Ambulatory Visit: Payer: Self-pay | Admitting: Internal Medicine

## 2025-01-01 ENCOUNTER — Telehealth: Payer: Self-pay | Admitting: Internal Medicine

## 2025-01-01 NOTE — Telephone Encounter (Signed)
 Copied from CRM [id number]. Topic: Medicare AWV >> Jan 01, 2025  2:31 PM Dustin DEL wrote: Called LVM 01/01/2025 to sched AWVS. Please schedule AWVS in office.  Dustin Wall; Care Guide Ambulatory Clinical Support [place] [facility] Direct Dial: [phone]

## 2025-04-04 ENCOUNTER — Ambulatory Visit: Admitting: Internal Medicine
